# Patient Record
Sex: Female | Born: 1985 | Race: Black or African American | Hispanic: No | Marital: Single | Smoking: Never smoker
Health system: Southern US, Community
[De-identification: ages and names within clinical notes are randomized; demographics above are authoritative.]

## PROBLEM LIST (undated history)

## (undated) ENCOUNTER — Inpatient Hospital Stay (HOSPITAL_COMMUNITY): Payer: Self-pay

## (undated) DIAGNOSIS — R51 Headache: Secondary | ICD-10-CM

## (undated) DIAGNOSIS — R87629 Unspecified abnormal cytological findings in specimens from vagina: Secondary | ICD-10-CM

## (undated) DIAGNOSIS — B009 Herpesviral infection, unspecified: Secondary | ICD-10-CM

## (undated) DIAGNOSIS — D563 Thalassemia minor: Secondary | ICD-10-CM

## (undated) DIAGNOSIS — O139 Gestational [pregnancy-induced] hypertension without significant proteinuria, unspecified trimester: Secondary | ICD-10-CM

## (undated) DIAGNOSIS — R519 Headache, unspecified: Secondary | ICD-10-CM

## (undated) DIAGNOSIS — R7611 Nonspecific reaction to tuberculin skin test without active tuberculosis: Secondary | ICD-10-CM

## (undated) DIAGNOSIS — D649 Anemia, unspecified: Secondary | ICD-10-CM

## (undated) DIAGNOSIS — G43909 Migraine, unspecified, not intractable, without status migrainosus: Secondary | ICD-10-CM

## (undated) DIAGNOSIS — J45909 Unspecified asthma, uncomplicated: Secondary | ICD-10-CM

## (undated) HISTORY — DX: Gestational (pregnancy-induced) hypertension without significant proteinuria, unspecified trimester: O13.9

## (undated) HISTORY — DX: Thalassemia minor: D56.3

## (undated) HISTORY — DX: Unspecified abnormal cytological findings in specimens from vagina: R87.629

## (undated) HISTORY — PX: TONSILLECTOMY: SUR1361

## (undated) HISTORY — PX: TONSILECTOMY, ADENOIDECTOMY, BILATERAL MYRINGOTOMY AND TUBES: SHX2538

## (undated) HISTORY — PX: WISDOM TOOTH EXTRACTION: SHX21

## (undated) HISTORY — DX: Migraine, unspecified, not intractable, without status migrainosus: G43.909

## (undated) HISTORY — DX: Nonspecific reaction to tuberculin skin test without active tuberculosis: R76.11

## (undated) HISTORY — DX: Anemia, unspecified: D64.9

---

## 2006-10-06 HISTORY — PX: PITUITARY SURGERY: SHX203

## 2014-04-09 ENCOUNTER — Emergency Department (HOSPITAL_COMMUNITY)
Admission: EM | Admit: 2014-04-09 | Discharge: 2014-04-09 | Disposition: A | Discharge status: Home / Self Care | Payer: 59 | Source: Home / Self Care | Attending: Family Medicine | Admitting: Family Medicine

## 2014-04-09 ENCOUNTER — Encounter (HOSPITAL_COMMUNITY): Payer: Self-pay | Admitting: Emergency Medicine

## 2014-04-09 ENCOUNTER — Emergency Department (INDEPENDENT_AMBULATORY_CARE_PROVIDER_SITE_OTHER): Payer: 59

## 2014-04-09 ENCOUNTER — Emergency Department (HOSPITAL_COMMUNITY): Payer: 59

## 2014-04-09 DIAGNOSIS — W230XXA Caught, crushed, jammed, or pinched between moving objects, initial encounter: Secondary | ICD-10-CM

## 2014-04-09 DIAGNOSIS — S6000XA Contusion of unspecified finger without damage to nail, initial encounter: Secondary | ICD-10-CM

## 2014-04-09 DIAGNOSIS — S6010XA Contusion of unspecified finger with damage to nail, initial encounter: Secondary | ICD-10-CM

## 2014-04-09 LAB — POCT PREGNANCY, URINE: Preg Test, Ur: NEGATIVE

## 2014-04-09 NOTE — Discharge Instructions (Signed)
Warm soak twice a day, wear splint for protection , advil for soreness.
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
## 2014-07-27 ENCOUNTER — Encounter: Payer: Self-pay | Admitting: Certified Nurse Midwife

## 2014-10-06 NOTE — L&D Delivery Note (Addendum)
Delivery Note  2258: Nurse call reports patient C/C/0 but with urge to push.  In room to assess.  Infant at +3 station.  Nurse instructed to call neonatology team for assistance secondary to Cat II FT and heavy MSAF.  Dr. Marney Doctor and team in room to assess infant after delivery.  Mother delivered as below with staff and family support.   At 11:14 PM, on Sep 03, 2015 a viable female "[name]" was delivered via Vaginal, Spontaneous Delivery (Presentation: Right Occiput Anterior). Bulb suction, of mouth and nares, completed on perineum.  Shoulders delivered easily and infant with good tone and spontaneous cry. Tactile stimulation given by provider and infant placed on mother's abdomen where nurse continued tactile stimulation. Short cord noted and placenta at the introitus.  Cord clamped and cut, but blood not collected. Infant then taken to warmer for assessment by neonatologist.  Infant APGAR: 8, 9. However, infant to be taken to NICU for observation secondary to initial fetal weight of 3lbs 15 oz.  Placenta delivered spontaneously and noted to be intact with short 3VC upon inspection.  Placenta to pathology for SGA. Vaginal inspection revealed no lacerations.  Fundus firm, at the umbilicus, and bleeding scant.  Mother hemodynamically stable and infant skin to skin prior to provider exit.  Mother desires condoms for birth control method and opts to breastfeed.  Anesthesia: Epidural  Episiotomy: None Lacerations: None Suture Repair: None Est. Blood Loss (mL): 50  Mom to Women's Unit.  Baby to NICU.  Maryann Conners MSN, CNM 09/03/2015, 11:46 PM

## 2014-11-13 ENCOUNTER — Emergency Department (HOSPITAL_COMMUNITY)
Admission: EM | Admit: 2014-11-13 | Discharge: 2014-11-13 | Disposition: A | Discharge status: Home / Self Care | Payer: 59 | Attending: Emergency Medicine | Admitting: Emergency Medicine

## 2014-11-13 ENCOUNTER — Encounter (HOSPITAL_COMMUNITY): Payer: Self-pay | Admitting: *Deleted

## 2014-11-13 DIAGNOSIS — R000 Tachycardia, unspecified: Secondary | ICD-10-CM | POA: Diagnosis not present

## 2014-11-13 DIAGNOSIS — R112 Nausea with vomiting, unspecified: Secondary | ICD-10-CM | POA: Diagnosis not present

## 2014-11-13 DIAGNOSIS — R1110 Vomiting, unspecified: Secondary | ICD-10-CM | POA: Diagnosis present

## 2014-11-13 DIAGNOSIS — J45909 Unspecified asthma, uncomplicated: Secondary | ICD-10-CM | POA: Insufficient documentation

## 2014-11-13 DIAGNOSIS — Z79899 Other long term (current) drug therapy: Secondary | ICD-10-CM | POA: Insufficient documentation

## 2014-11-13 DIAGNOSIS — R109 Unspecified abdominal pain: Secondary | ICD-10-CM | POA: Diagnosis not present

## 2014-11-13 DIAGNOSIS — Z3202 Encounter for pregnancy test, result negative: Secondary | ICD-10-CM | POA: Insufficient documentation

## 2014-11-13 HISTORY — DX: Unspecified asthma, uncomplicated: J45.909

## 2014-11-13 LAB — URINE MICROSCOPIC-ADD ON

## 2014-11-13 LAB — CBC WITH DIFFERENTIAL/PLATELET
BASOS ABS: 0 10*3/uL (ref 0.0–0.1)
Basophils Relative: 0 % (ref 0–1)
EOS ABS: 0.1 10*3/uL (ref 0.0–0.7)
Eosinophils Relative: 1 % (ref 0–5)
HEMATOCRIT: 36.6 % (ref 36.0–46.0)
HEMOGLOBIN: 11.6 g/dL — AB (ref 12.0–15.0)
LYMPHS ABS: 0.3 10*3/uL — AB (ref 0.7–4.0)
LYMPHS PCT: 3 % — AB (ref 12–46)
MCH: 24.2 pg — AB (ref 26.0–34.0)
MCHC: 31.7 g/dL (ref 30.0–36.0)
MCV: 76.4 fL — ABNORMAL LOW (ref 78.0–100.0)
Monocytes Absolute: 0.6 10*3/uL (ref 0.1–1.0)
Monocytes Relative: 5 % (ref 3–12)
Neutro Abs: 10.8 10*3/uL — ABNORMAL HIGH (ref 1.7–7.7)
Neutrophils Relative %: 91 % — ABNORMAL HIGH (ref 43–77)
Platelets: 305 10*3/uL (ref 150–400)
RBC: 4.79 MIL/uL (ref 3.87–5.11)
RDW: 14 % (ref 11.5–15.5)
WBC: 11.8 10*3/uL — AB (ref 4.0–10.5)

## 2014-11-13 LAB — URINALYSIS, ROUTINE W REFLEX MICROSCOPIC
Bilirubin Urine: NEGATIVE
Glucose, UA: NEGATIVE mg/dL
KETONES UR: NEGATIVE mg/dL
Leukocytes, UA: NEGATIVE
NITRITE: NEGATIVE
PH: 5.5 (ref 5.0–8.0)
PROTEIN: NEGATIVE mg/dL
Specific Gravity, Urine: 1.025 (ref 1.005–1.030)
Urobilinogen, UA: 1 mg/dL (ref 0.0–1.0)

## 2014-11-13 LAB — COMPREHENSIVE METABOLIC PANEL
ALT: 13 U/L (ref 0–35)
AST: 24 U/L (ref 0–37)
Albumin: 4.4 g/dL (ref 3.5–5.2)
Alkaline Phosphatase: 69 U/L (ref 39–117)
Anion gap: 9 (ref 5–15)
BUN: 21 mg/dL (ref 6–23)
CHLORIDE: 104 mmol/L (ref 96–112)
CO2: 26 mmol/L (ref 19–32)
Calcium: 9 mg/dL (ref 8.4–10.5)
Creatinine, Ser: 0.64 mg/dL (ref 0.50–1.10)
Glucose, Bld: 123 mg/dL — ABNORMAL HIGH (ref 70–99)
Potassium: 3.8 mmol/L (ref 3.5–5.1)
Sodium: 139 mmol/L (ref 135–145)
TOTAL PROTEIN: 8 g/dL (ref 6.0–8.3)
Total Bilirubin: 0.8 mg/dL (ref 0.3–1.2)

## 2014-11-13 LAB — LIPASE, BLOOD: LIPASE: 29 U/L (ref 11–59)

## 2014-11-13 LAB — PREGNANCY, URINE: PREG TEST UR: NEGATIVE

## 2014-11-13 MED ORDER — DIPHENHYDRAMINE HCL 50 MG/ML IJ SOLN
25.0000 mg | Freq: Once | INTRAMUSCULAR | Status: AC
  Administered 2014-11-13: 25 mg via INTRAVENOUS
  Filled 2014-11-13: qty 1

## 2014-11-13 MED ORDER — ONDANSETRON 4 MG PO TBDP: 4.0000 mg | ORAL_TABLET | Freq: Three times a day (TID) | ORAL | Status: DC | PRN

## 2014-11-13 MED ORDER — ONDANSETRON HCL 4 MG/2ML IJ SOLN
4.0000 mg | Freq: Once | INTRAMUSCULAR | Status: AC
  Administered 2014-11-13: 4 mg via INTRAVENOUS
  Filled 2014-11-13: qty 2

## 2014-11-13 MED ORDER — LORAZEPAM 2 MG/ML IJ SOLN: 1.0000 mg | Freq: Once | INTRAMUSCULAR | Status: DC

## 2014-11-13 MED ORDER — SODIUM CHLORIDE 0.9 % IV BOLUS (SEPSIS)
1000.0000 mL | Freq: Once | INTRAVENOUS | Status: AC
  Administered 2014-11-13: 1000 mL via INTRAVENOUS

## 2014-11-13 MED ORDER — MORPHINE SULFATE 4 MG/ML IJ SOLN
4.0000 mg | Freq: Once | INTRAMUSCULAR | Status: DC
  Filled 2014-11-13: qty 1

## 2014-11-13 MED ORDER — PROMETHAZINE HCL 25 MG/ML IJ SOLN
25.0000 mg | Freq: Once | INTRAMUSCULAR | Status: AC
  Administered 2014-11-13: 25 mg via INTRAVENOUS
  Filled 2014-11-13: qty 1

## 2014-11-13 MED ORDER — METOCLOPRAMIDE HCL 5 MG/ML IJ SOLN
10.0000 mg | Freq: Once | INTRAMUSCULAR | Status: AC
  Administered 2014-11-13: 10 mg via INTRAVENOUS
  Filled 2014-11-13: qty 2

## 2014-11-13 NOTE — ED Provider Notes (Signed)
CSN: [id number]     Arrival date & time 11/13/14  0125 History   First MD Initiated Contact with Patient 11/13/14 0235     Chief Complaint  Patient presents with  . Emesis     (Consider location/radiation/quality/duration/timing/severity/associated sxs/prior Treatment) HPI Comments: Patient is a 29 yo F PMHx significant for asthma presenting to the ED for acute onset nausea, nonbloody nonbilious vomiting that began yesterday at 6 AM. She has associated post emesis upper burning cramping abdominal pain. She denies any associated fevers, diarrhea, urinary symptoms, vaginal discharge. She's had decreased by mouth intake. No modifying factors identified. Denies any sick contacts. No history of previous abdominal surgeries. No pregnancies in the past. Patient currently on menstrual cycle.   Patient is a 29 y.o. female presenting with vomiting.  Emesis Associated symptoms: abdominal pain     Past Medical History  Diagnosis Date  . Asthma    Past Surgical History  Procedure Laterality Date  . Pituitary surgery     History reviewed. No pertinent family history. History  Substance Use Topics  . Smoking status: Never Smoker   . Smokeless tobacco: Not on file  . Alcohol Use: No   OB History    No data available     Review of Systems  Gastrointestinal: Positive for nausea, vomiting and abdominal pain.  All other systems reviewed and are negative.     Allergies  Shellfish allergy  Home Medications   Prior to Admission medications   Medication Sig Start Date End Date Taking? Authorizing Provider  albuterol (PROVENTIL HFA;VENTOLIN HFA) 108 (90 BASE) MCG/ACT inhaler Inhale 1 puff into the lungs every 6 (six) hours as needed for wheezing or shortness of breath.   Yes Historical Provider, MD  aspirin-acetaminophen-caffeine (EXCEDRIN MIGRAINE) [phone] MG per tablet Take 1 tablet by mouth every 6 (six) hours as needed for headache.   Yes Historical Provider, MD  ondansetron (ZOFRAN  ODT) 4 MG disintegrating tablet Take 1 tablet (4 mg total) by mouth every 8 (eight) hours as needed for nausea. 11/13/14   Abbygael Curtiss L Kalley Nicholl, PA-C   BP 115/70 mmHg  Pulse 120  Temp(Src) 97.9 F (36.6 C) (Oral)  Resp 18  Ht 5\' 5"  (1.651 m)  Wt 110 lb (49.896 kg)  BMI 18.31 kg/m2  SpO2 100%  LMP 11/08/2014 (Exact Date) Physical Exam  Constitutional: She is oriented to person, place, and time. She appears well-developed and well-nourished. No distress.  HENT:  Head: Normocephalic and atraumatic.  Right Ear: External ear normal.  Left Ear: External ear normal.  Nose: Nose normal.  Mouth/Throat: Uvula is midline and oropharynx is clear and moist. Mucous membranes are dry.  Eyes: Conjunctivae are normal.  Neck: Normal range of motion. Neck supple.  Cardiovascular: Regular rhythm and normal heart sounds.  Tachycardia present.   Pulmonary/Chest: Effort normal and breath sounds normal. No respiratory distress.  Abdominal: Soft. Bowel sounds are normal. There is no tenderness.  Musculoskeletal: Normal range of motion.  Neurological: She is alert and oriented to person, place, and time.  Skin: Skin is warm and dry. She is not diaphoretic.  Psychiatric: She has a normal mood and affect.  Nursing note and vitals reviewed.   ED Course  Procedures (including critical care time) Medications  morphine 4 MG/ML injection 4 mg (4 mg Intravenous Not Given 11/13/14 0457)  LORazepam (ATIVAN) injection 1 mg (not administered)  ondansetron (ZOFRAN) injection 4 mg (4 mg Intravenous Given 11/13/14 0243)  promethazine (PHENERGAN) injection 25 mg (25 mg  Intravenous Given 11/13/14 0457)  sodium chloride 0.9 % bolus 1,000 mL (1,000 mLs Intravenous New Bag/Given 11/13/14 0553)  metoCLOPramide (REGLAN) injection 10 mg (10 mg Intravenous Given 11/13/14 0554)  diphenhydrAMINE (BENADRYL) injection 25 mg (25 mg Intravenous Given 11/13/14 0554)    Labs Review Labs Reviewed  CBC WITH DIFFERENTIAL/PLATELET - Abnormal;  Notable for the following:    WBC 11.8 (*)    Hemoglobin 11.6 (*)    MCV 76.4 (*)    MCH 24.2 (*)    Neutrophils Relative % 91 (*)    Neutro Abs 10.8 (*)    Lymphocytes Relative 3 (*)    Lymphs Abs 0.3 (*)    All other components within normal limits  COMPREHENSIVE METABOLIC PANEL - Abnormal; Notable for the following:    Glucose, Bld 123 (*)    All other components within normal limits  URINALYSIS, ROUTINE W REFLEX MICROSCOPIC - Abnormal; Notable for the following:    Hgb urine dipstick MODERATE (*)    All other components within normal limits  URINE MICROSCOPIC-ADD ON - Abnormal; Notable for the following:    Squamous Epithelial / LPF FEW (*)    All other components within normal limits  LIPASE, BLOOD  PREGNANCY, URINE    Imaging Review No results found.   EKG Interpretation None      5:40 AM on reevaluation patient with another episode of nonbloody emesis in ED will order more nausea medicine and fluids.  MDM   Final diagnoses:  Non-intractable vomiting with nausea, vomiting of unspecified type    Filed Vitals:   11/13/14 0530  BP: 115/70  Pulse: 120  Temp:   Resp:    Afebrile, NAD, non-toxic appearing, AAOx4.   Mucous membranes dry, slight tachycardia noted. Abdomen is soft, nontender, nondistended. IV fluids and nausea medicine ordered. Patient monitored in ED, nausea returned along with pain. Patient declined any pain medication. Labs reviewed. Patient with another incidence of emesis while in ED, another bolus of IV fluids will be ordered along with more nausea medication. Patient will be signed out to [employer], PA-C pending PO challenge.   Harlow Mares, PA-C 11/13/14 0177  Blanchie Dessert, MD 11/13/14 [phone]

## 2014-11-13 NOTE — Discharge Instructions (Signed)
Please follow up with your primary care physician in 1-2 days. If you do not have one please call the [facility] number listed above. Please read all discharge instructions and return precautions.    Nausea and Vomiting Nausea is a sick feeling that often comes before throwing up (vomiting). Vomiting is a reflex where stomach contents come out of your mouth. Vomiting can cause severe loss of body fluids (dehydration). Children and elderly adults can become dehydrated quickly, especially if they also have diarrhea. Nausea and vomiting are symptoms of a condition or disease. It is important to find the cause of your symptoms. CAUSES   Direct irritation of the stomach lining. This irritation can result from increased acid production (gastroesophageal reflux disease), infection, food poisoning, taking certain medicines (such as nonsteroidal anti-inflammatory drugs), alcohol use, or tobacco use.  Signals from the brain.These signals could be caused by a headache, heat exposure, an inner ear disturbance, increased pressure in the brain from injury, infection, a tumor, or a concussion, pain, emotional stimulus, or metabolic problems.  An obstruction in the gastrointestinal tract (bowel obstruction).  Illnesses such as diabetes, hepatitis, gallbladder problems, appendicitis, kidney problems, cancer, sepsis, atypical symptoms of a heart attack, or eating disorders.  Medical treatments such as chemotherapy and radiation.  Receiving medicine that makes you sleep (general anesthetic) during surgery. DIAGNOSIS Your caregiver may ask for tests to be done if the problems do not improve after a few days. Tests may also be done if symptoms are severe or if the reason for the nausea and vomiting is not clear. Tests may include:  Urine tests.  Blood tests.  Stool tests.  Cultures (to look for evidence of infection).  X-rays or other imaging studies. Test results can help your  caregiver make decisions about treatment or the need for additional tests. TREATMENT You need to stay well hydrated. Drink frequently but in small amounts.You may wish to drink water, sports drinks, clear broth, or eat frozen ice pops or gelatin dessert to help stay hydrated.When you eat, eating slowly may help prevent nausea.There are also some antinausea medicines that may help prevent nausea. HOME CARE INSTRUCTIONS   Take all medicine as directed by your caregiver.  If you do not have an appetite, do not force yourself to eat. However, you must continue to drink fluids.  If you have an appetite, eat a normal diet unless your caregiver tells you differently.  Eat a variety of complex carbohydrates (rice, wheat, potatoes, bread), lean meats, yogurt, fruits, and vegetables.  Avoid high-fat foods because they are more difficult to digest.  Drink enough water and fluids to keep your urine clear or pale yellow.  If you are dehydrated, ask your caregiver for specific rehydration instructions. Signs of dehydration may include:  Severe thirst.  Dry lips and mouth.  Dizziness.  Dark urine.  Decreasing urine frequency and amount.  Confusion.  Rapid breathing or pulse. SEEK IMMEDIATE MEDICAL CARE IF:   You have blood or brown flecks (like coffee grounds) in your vomit.  You have black or bloody stools.  You have a severe headache or stiff neck.  You are confused.  You have severe abdominal pain.  You have chest pain or trouble breathing.  You do not urinate at least once every 8 hours.  You develop cold or clammy skin.  You continue to vomit for longer than 24 to 48 hours.  You have a fever. MAKE SURE YOU:   Understand these instructions.  Will watch your condition.  Will get help right away if you are not doing well or get worse. Document Released: 09/22/2005 Document Revised: 12/15/2011 Document Reviewed: 02/19/2011 [facility] Patient Information 2015  [place], [place]. This information is not intended to replace advice given to you by your health care provider. Make sure you discuss any questions you have with your health care provider.

## 2014-11-13 NOTE — ED Notes (Signed)
Pt reports vomiting that began approx 0600 yesterday morning, denies diarrhea or fever.

## 2014-11-13 NOTE — ED Notes (Signed)
Nurse drawing labs. 

## 2014-11-13 NOTE — ED Notes (Signed)
Pt tolerated oral fluids w/o difficulty.

## 2014-11-13 NOTE — ED Notes (Signed)
Patient made aware urine sample is needed but unable to go, will see if she can go once fluids have gone through some.

## 2015-01-22 LAB — OB RESULTS CONSOLE HEPATITIS B SURFACE ANTIGEN: Hepatitis B Surface Ag: NEGATIVE

## 2015-01-22 LAB — OB RESULTS CONSOLE HIV ANTIBODY (ROUTINE TESTING): HIV: NONREACTIVE

## 2015-01-22 LAB — OB RESULTS CONSOLE ABO/RH: RH TYPE: POSITIVE

## 2015-01-22 LAB — OB RESULTS CONSOLE GC/CHLAMYDIA
CHLAMYDIA, DNA PROBE: NEGATIVE
Gonorrhea: NEGATIVE

## 2015-01-22 LAB — OB RESULTS CONSOLE RUBELLA ANTIBODY, IGM: Rubella: IMMUNE

## 2015-01-22 LAB — OB RESULTS CONSOLE ANTIBODY SCREEN: Antibody Screen: NEGATIVE

## 2015-01-22 LAB — OB RESULTS CONSOLE RPR: RPR: NONREACTIVE

## 2015-04-24 ENCOUNTER — Encounter (HOSPITAL_COMMUNITY): Payer: Self-pay | Admitting: Emergency Medicine

## 2015-06-07 ENCOUNTER — Inpatient Hospital Stay (HOSPITAL_COMMUNITY): Admission: AD | Admit: 2015-06-07 | Payer: 59 | Source: Ambulatory Visit | Admitting: Obstetrics & Gynecology

## 2015-08-28 LAB — OB RESULTS CONSOLE GBS: STREP GROUP B AG: POSITIVE

## 2015-09-03 ENCOUNTER — Encounter (HOSPITAL_COMMUNITY): Payer: Self-pay | Admitting: *Deleted

## 2015-09-03 ENCOUNTER — Inpatient Hospital Stay (HOSPITAL_COMMUNITY): Payer: 59 | Admitting: Anesthesiology

## 2015-09-03 ENCOUNTER — Inpatient Hospital Stay (HOSPITAL_COMMUNITY)
Admission: RE | Admit: 2015-09-03 | Discharge: 2015-09-05 | DRG: 775 | Disposition: A | Discharge status: Home / Self Care | Payer: 59 | Source: Ambulatory Visit | Attending: Obstetrics and Gynecology | Admitting: Obstetrics and Gynecology

## 2015-09-03 DIAGNOSIS — Z3A37 37 weeks gestation of pregnancy: Secondary | ICD-10-CM

## 2015-09-03 DIAGNOSIS — Z91013 Allergy to seafood: Secondary | ICD-10-CM | POA: Diagnosis not present

## 2015-09-03 DIAGNOSIS — Z8669 Personal history of other diseases of the nervous system and sense organs: Secondary | ICD-10-CM

## 2015-09-03 DIAGNOSIS — O99824 Streptococcus B carrier state complicating childbirth: Secondary | ICD-10-CM | POA: Diagnosis present

## 2015-09-03 DIAGNOSIS — O26893 Other specified pregnancy related conditions, third trimester: Secondary | ICD-10-CM | POA: Diagnosis present

## 2015-09-03 DIAGNOSIS — G8929 Other chronic pain: Secondary | ICD-10-CM | POA: Diagnosis present

## 2015-09-03 DIAGNOSIS — R519 Headache, unspecified: Secondary | ICD-10-CM | POA: Diagnosis present

## 2015-09-03 DIAGNOSIS — J45909 Unspecified asthma, uncomplicated: Secondary | ICD-10-CM | POA: Diagnosis present

## 2015-09-03 DIAGNOSIS — O9952 Diseases of the respiratory system complicating childbirth: Secondary | ICD-10-CM | POA: Diagnosis present

## 2015-09-03 DIAGNOSIS — O365930 Maternal care for other known or suspected poor fetal growth, third trimester, not applicable or unspecified: Secondary | ICD-10-CM | POA: Diagnosis present

## 2015-09-03 DIAGNOSIS — G43909 Migraine, unspecified, not intractable, without status migrainosus: Secondary | ICD-10-CM

## 2015-09-03 DIAGNOSIS — R51 Headache: Secondary | ICD-10-CM

## 2015-09-03 HISTORY — DX: Headache: R51

## 2015-09-03 HISTORY — DX: Headache, unspecified: R51.9

## 2015-09-03 LAB — CBC
HEMATOCRIT: 40 % (ref 36.0–46.0)
HEMOGLOBIN: 13.2 g/dL (ref 12.0–15.0)
MCH: 24.3 pg — AB (ref 26.0–34.0)
MCHC: 33 g/dL (ref 30.0–36.0)
MCV: 73.5 fL — AB (ref 78.0–100.0)
PLATELETS: 226 10*3/uL (ref 150–400)
RBC: 5.44 MIL/uL — AB (ref 3.87–5.11)
RDW: 20.2 % — AB (ref 11.5–15.5)
WBC: 7.3 10*3/uL (ref 4.0–10.5)

## 2015-09-03 LAB — TYPE AND SCREEN
ABO/RH(D): B POS
Antibody Screen: NEGATIVE

## 2015-09-03 MED ORDER — OXYTOCIN 40 UNITS IN LACTATED RINGERS INFUSION - SIMPLE MED
62.5000 mL/h | INTRAVENOUS | Status: DC
  Administered 2015-09-03: 62.5 mL/h via INTRAVENOUS
  Filled 2015-09-03: qty 1000

## 2015-09-03 MED ORDER — ACETAMINOPHEN 325 MG PO TABS: 650.0000 mg | ORAL_TABLET | ORAL | Status: DC | PRN

## 2015-09-03 MED ORDER — OXYCODONE-ACETAMINOPHEN 5-325 MG PO TABS
2.0000 | ORAL_TABLET | ORAL | Status: DC | PRN
Start: 2015-09-03 — End: 2015-09-04

## 2015-09-03 MED ORDER — LIDOCAINE HCL (PF) 1 % IJ SOLN
30.0000 mL | INTRAMUSCULAR | Status: DC | PRN
  Filled 2015-09-03: qty 30

## 2015-09-03 MED ORDER — FENTANYL CITRATE (PF) 100 MCG/2ML IJ SOLN
50.0000 ug | INTRAMUSCULAR | Status: DC | PRN
  Administered 2015-09-03: 50 ug via INTRAVENOUS
  Administered 2015-09-03: 100 ug via INTRAVENOUS
  Filled 2015-09-03 (×3): qty 2

## 2015-09-03 MED ORDER — LACTATED RINGERS IV SOLN
INTRAVENOUS | Status: DC
  Administered 2015-09-03: 20:00:00 via INTRAVENOUS

## 2015-09-03 MED ORDER — PHENYLEPHRINE 40 MCG/ML (10ML) SYRINGE FOR IV PUSH (FOR BLOOD PRESSURE SUPPORT)
PREFILLED_SYRINGE | INTRAVENOUS | Status: AC
  Filled 2015-09-03: qty 20

## 2015-09-03 MED ORDER — ONDANSETRON HCL 4 MG/2ML IJ SOLN
4.0000 mg | Freq: Four times a day (QID) | INTRAMUSCULAR | Status: DC | PRN
  Administered 2015-09-03: 4 mg via INTRAVENOUS
  Filled 2015-09-03: qty 2

## 2015-09-03 MED ORDER — PENICILLIN G POTASSIUM 5000000 UNITS IJ SOLR
5.0000 10*6.[IU] | Freq: Once | INTRAVENOUS | Status: AC
  Administered 2015-09-03: 5 10*6.[IU] via INTRAVENOUS
  Filled 2015-09-03: qty 5

## 2015-09-03 MED ORDER — LIDOCAINE HCL (PF) 1 % IJ SOLN
INTRAMUSCULAR | Status: DC | PRN
  Administered 2015-09-03 (×2): 4 mL via EPIDURAL

## 2015-09-03 MED ORDER — DIPHENHYDRAMINE HCL 50 MG/ML IJ SOLN: 12.5000 mg | INTRAMUSCULAR | Status: DC | PRN

## 2015-09-03 MED ORDER — EPHEDRINE 5 MG/ML INJ
10.0000 mg | INTRAVENOUS | Status: DC | PRN
  Filled 2015-09-03: qty 2

## 2015-09-03 MED ORDER — DEXTROSE 5 % IV SOLN
2.5000 10*6.[IU] | INTRAVENOUS | Status: DC
  Administered 2015-09-03: 2.5 10*6.[IU] via INTRAVENOUS
  Filled 2015-09-03 (×5): qty 2.5

## 2015-09-03 MED ORDER — CITRIC ACID-SODIUM CITRATE 334-500 MG/5ML PO SOLN: 30.0000 mL | ORAL | Status: DC | PRN

## 2015-09-03 MED ORDER — PROMETHAZINE HCL 25 MG/ML IJ SOLN
12.5000 mg | Freq: Once | INTRAMUSCULAR | Status: AC
  Administered 2015-09-03: 12.5 mg via INTRAVENOUS
  Filled 2015-09-03: qty 1

## 2015-09-03 MED ORDER — PHENYLEPHRINE 40 MCG/ML (10ML) SYRINGE FOR IV PUSH (FOR BLOOD PRESSURE SUPPORT)
80.0000 ug | PREFILLED_SYRINGE | INTRAVENOUS | Status: DC | PRN
  Filled 2015-09-03: qty 2

## 2015-09-03 MED ORDER — FLEET ENEMA 7-19 GM/118ML RE ENEM: 1.0000 | ENEMA | RECTAL | Status: DC | PRN

## 2015-09-03 MED ORDER — FENTANYL 2.5 MCG/ML BUPIVACAINE 1/10 % EPIDURAL INFUSION (WH - ANES)
INTRAMUSCULAR | Status: AC
  Administered 2015-09-03: 14 mL/h via EPIDURAL
  Filled 2015-09-03: qty 125

## 2015-09-03 MED ORDER — LACTATED RINGERS IV SOLN: 500.0000 mL | INTRAVENOUS | Status: DC | PRN

## 2015-09-03 MED ORDER — OXYTOCIN BOLUS FROM INFUSION: 500.0000 mL | INTRAVENOUS | Status: DC

## 2015-09-03 MED ORDER — FENTANYL 2.5 MCG/ML BUPIVACAINE 1/10 % EPIDURAL INFUSION (WH - ANES)
14.0000 mL/h | INTRAMUSCULAR | Status: DC | PRN
  Administered 2015-09-03 (×2): 14 mL/h via EPIDURAL

## 2015-09-03 MED ORDER — OXYCODONE-ACETAMINOPHEN 5-325 MG PO TABS
1.0000 | ORAL_TABLET | ORAL | Status: DC | PRN
Start: 2015-09-03 — End: 2015-09-04

## 2015-09-03 NOTE — Progress Notes (Addendum)
Brookelynne Autin MRN: [id number]  Subjective: -Patient s/p epidural.  Reports continued perception of contractions in lower abdominal area, above pubic bone.  FOB DeAndre at bedside, supportive.   Objective: BP 129/90 mmHg  Pulse 93  Temp(Src) 97.5 F (36.4 C) (Oral)  Resp 18  Ht 5\' 5"  (1.651 m)  Wt 61.236 kg (135 lb)  BMI 22.47 kg/m2  LMP 12/15/2014      Fetal Monitoring: FHT: 135 bpm, Mod Var, +Prolonged Decels, +Accels UC: palpates moderate to strong    Vaginal Exam: SVE:   Dilation: 4 Effacement (%): 70 Station: -1 Exam by:: Janett Billow, CNM  Drk Brown Meconium Noted Membranes:SROM Internal Monitors: Utilized x 2  Augmentation/Induction: Pitocin:None Cytotec: None  O2 applied via rebreather Fluid bolus initiated  Assessment:  IUP at 37.3wks Cat II FT  Active Labor GBS Positive MSAF Nausea  Plan: -Cat II FT resolved with initiation of O2, fluid bolus -FSE applied -Patient given bolus dose, via, pca for pain -Will contact anesthesia if pain unrelieved after pca dosing -Phenergan once ordered for nausea -Continue other mgmt as ordered   Riley Churches, CNM 09/03/2015, 9:51 PM   Addendum Nurse call reports FSE off and reapplied without difficulty Reports patient is 5-6cm and starting to get comfort with epidural Continue to monitor  Maryann Conners MSN, CNM 09/03/2015 9:58 PM

## 2015-09-03 NOTE — Anesthesia Procedure Notes (Signed)
Epidural Patient location during procedure: OB Start time: 09/03/2015 8:47 PM  Staffing Anesthesiologist: Josephine Igo Performed by: anesthesiologist   Preanesthetic Checklist Completed: patient identified, site marked, surgical consent, pre-op evaluation, timeout performed, IV checked, risks and benefits discussed and monitors and equipment checked  Epidural Patient position: sitting Prep: site prepped and draped and DuraPrep Patient monitoring: continuous pulse ox and blood pressure Approach: midline Location: L3-L4 Injection technique: LOR air  Needle:  Needle type: Tuohy  Needle gauge: 17 G Needle length: 9 cm and 9 Needle insertion depth: 5 cm cm Catheter type: closed end flexible Catheter size: 19 Gauge Catheter at skin depth: 10 cm Test dose: negative and Other  Assessment Events: blood not aspirated, injection not painful, no injection resistance, negative IV test and no paresthesia  Additional Notes Patient identified. Risks and benefits discussed including failed block, incomplete  Pain control, post dural puncture headache, nerve damage, paralysis, blood pressure Changes, nausea, vomiting, reactions to medications-both toxic and allergic and post Partum back pain. All questions were answered. Patient expressed understanding and wished to proceed. Sterile technique was used throughout procedure. Epidural site was Dressed with sterile barrier dressing. No paresthesias, signs of intravascular injection Or signs of intrathecal spread were encountered.  Patient was more comfortable after the epidural was dosed. Please see RN's note for documentation of vital signs and FHR which are stable.

## 2015-09-03 NOTE — Progress Notes (Signed)
Jasmine Flores MRN: [id number]  Subjective: -Care assumed of 29y.o. G1P0 at 37.3wks who presents for SROM at 1600.  In room to meet acquaintance and discuss POC.  Patient states she prefers to go by name of "Jasmine Flores."  Patient expresses discomfort with contractions and requests epidural.  Patient states she would like to wait, for placement, until boyfriend arrives.   Objective: BP 149/95 mmHg  Pulse 79  Temp(Src) 97.5 F (36.4 C) (Oral)  Resp 18  Ht 5\' 5"  (1.651 m)  Wt 61.236 kg (135 lb)  BMI 22.47 kg/m2  LMP 12/15/2014      Fetal Monitoring: FHT: 140 bpm, Mod Var, -Decels, +Accels UC: Palpates mild    Vaginal Exam: SVE:    Deferred Membranes:SROM  Internal Monitors: None  Augmentation/Induction: Pitocin:None Cytotec: None  Assessment:  IUP at 37.3wks Cat I FT  SROM GBS Positive  Plan: -Okay for epidural  -Discussed IUPC insertion and augmentation methods after comfort with epidural established -Discussed r/b of IUPC including infection, ability to measure contraction quality and quantity. -Discussed r/b of pitocin and/or cytotec usage including fetal distress/intolerance resulting in increased risk for c/s -Patient verbalizes understanding and agrees with POC -Dr. Kathlen Mody aware of patient status -Continue other mgmt as ordered  Riley Churches, CNM 09/03/2015, 8:06 PM

## 2015-09-03 NOTE — Anesthesia Preprocedure Evaluation (Signed)
Anesthesia Evaluation  Patient identified by MRN, date of birth, ID band Patient awake    Reviewed: Allergy & Precautions, NPO status , Patient's Chart, lab work & pertinent test results  Airway Mallampati: II  TM Distance: >3 FB Neck ROM: Full    Dental no notable dental hx. (+) Teeth Intact   Pulmonary asthma ,    Pulmonary exam normal breath sounds clear to auscultation       Cardiovascular negative cardio ROS Normal cardiovascular exam Rhythm:Regular Rate:Normal     Neuro/Psych  Headaches, negative psych ROS   GI/Hepatic negative GI ROS, Neg liver ROS,   Endo/Other  Hx/o Pituitary surgery  Renal/GU negative Renal ROS  negative genitourinary   Musculoskeletal negative musculoskeletal ROS (+)   Abdominal   Peds  Hematology negative hematology ROS (+)   Anesthesia Other Findings   Reproductive/Obstetrics (+) Pregnancy                             Anesthesia Physical Anesthesia Plan  ASA: II  Anesthesia Plan: Epidural   Post-op Pain Management:    Induction:   Airway Management Planned: Natural Airway  Additional Equipment:   Intra-op Plan:   Post-operative Plan:   Informed Consent: I have reviewed the patients History and Physical, chart, labs and discussed the procedure including the risks, benefits and alternatives for the proposed anesthesia with the patient or authorized representative who has indicated his/her understanding and acceptance.     Plan Discussed with: Anesthesiologist  Anesthesia Plan Comments:         Anesthesia Quick Evaluation

## 2015-09-03 NOTE — H&P (Signed)
Jasmine Flores is a 29 y.o. female, G1P000 at 37.3 weeks, presenting a direct admit to [facility].  PT presented as a labor check today at [place]. After she scheduled her office appointment she experienced a large gush of fluid asnd started leaking brownish fluid. C/o back and pelvic pain. In the office, noted to be grossly ruptuure with thich mecomiun & particulate matter.  GBS positive.  SVE: 1/thick0high.  Patient Active Problem List   Diagnosis Date Noted  . Labor and delivery complicated by meconium in amniotic fluid 09/03/2015  . Asthma 09/03/2015  . Hx of migraines 09/03/2015  . Allergy to shellfish 09/03/2015  . Chronic headache disorder 09/03/2015    History of present pregnancy: Patient entered care at 9.4 weeks.   EDC of12/16/16 was established by early [place] at 8.4 wks on 02/13/15.    Anatomy scan:  19.3 weeks, with normal findings and an Anterior placenta. Vertex, singleton, no previa, placenta  ede to cx= 7.9cm.  Placental cord insertion seen.  Normal fluid. AP pocket=4.0cm. Measurement concordant  with establish GA.    Anatomy: open hands, 5th digit, ng, AA and DA seen.   Female gender.  Additional [place] evaluations:      [redacted]w[redacted]d ( Dating 02/13/15):   Anteverted uterus, [place] [phone] intrauterine pregnancy. Yolk sac seen  [redacted]w[redacted]d ([place] 02/20/15): Anteverted uterus, [place] [phone] intrauterine pregnancy.   [redacted]w[redacted]d (NT screening 03/09/15):  [place] IUP +FHT.  Anteverted uterus, amnion seen, normal fluid, CRL is  concordant with  Ultrasound estabilished GA/EDD.  NRT=1.28mm. RTOV / Right adnexa- unremarkable. Left  LTOV- simple cyst. Color flow seen.  Measures 3.6cm X 3.2cm X 3.0 cm. Left adenexa unremarkable.  No  free fluid.  Signficant prenatal events: First Trimester :  C/o painful feet, fatigue, nausea, migraines.   Has asthama- uses an albuterol inhaler. Experienced first trimester spotting.  Cervicovaginal cytology: Low grade squamous intraepithelial lesion noted on pap- Colpo  done 5/24, postpartum follow up recommended. Second Trimester: Continues to c/o headaches, Fioricet provided. Third Trimester: Low Hgb noted, daily iron supplement provided.   Last evaluation:  09/03/15   V.Standard, CNM      BP 126/78,    Pt. present for labor check/D/C. Pt. states she thought she lost her mucous plug; pt. states after getting off the phone w/[facility] she heard a gush noise since then she has been leaking brownish fluid. Bk/pelvic pain present. No recent MAU visits. Pt. appears to be in lots of pain. LJ CMA   OB History    Gravida Para Term Preterm AB TAB SAB Ectopic Multiple Living   0 0 0 0 0 0 0 0       Past Medical History  Diagnosis Date  . Asthma    Past Surgical History  Procedure Laterality Date  . Pituitary surgery     Family History: family history is not on file. Social History:  reports that she has never smoked. She does not have any smokeless tobacco history on file. She reports that she does not drink alcohol or use illicit drugs.   Prenatal Transfer Tool  Maternal Diabetes: No Genetic Screening: Normal Maternal Ultrasounds/Referrals: Normal Fetal Ultrasounds or other Referrals:  None Maternal Substance Abuse:  No Significant Maternal Medications:  Meds include: Other:   Fioricet,  Iron,  ProAIr inhaler Significant Maternal Lab Results: Lab values include: Group B Strep positive  TDAP 07/18/15 Flu    Unknown  ROS:  See above  Allergies  Allergen Reactions  . Shellfish Allergy  Swelling       There were no vitals taken for this visit.  Chest clear Heart RRR without murmur Abd gravid, NT,  Pelvic:  1/ thick/high in office, today 09/03/15 Ext: wnl   FHR: Category 1, 135 bpm, moderate variability,  No accels. No decels UCs:  Was irregular, q 4-7 ; NOW Regular, Ctx 2-4 min Vertex by [place]  Prenatal labs: ABO, Rh:       B, postive Antibody:    negative Rubella:  !Error!   Immune RPR:    NR , 01/22/15 HBsAg:   Negative   01/22/15 HIV:     NR GBS:    Positive   08/28/15  Sickle cell/Hgb electrophoresis:  AA% Pap:  Abn - Cervicovaginal cytology: Low grade squamous intraepithelial lesion noted on pap- Colpo done 5/24,  GC:  Negative 01/22/15 Chlamydia:  Negative   01/22/15, Genetic screenings:  Normal,  1rst trimester Glucola:  WNL Other:   Hgb 10.6 at NOB, 9.8 at 28 weeks   Assessment/Plan: IUP at 37.3 Vertex by [place] Rupture of Membranes at 1608 today, Thick meconium  GBS positive Allergy to shellfish Asthama Hx Migraines HX Chronic headache disorder   Plan: Admit to [facility] per consult with Dr. Charlesetta Garibaldi Routine [facility] orders Pain med/epidural prn PCN G for GBS prophylaxis  Expectant Management   Cherre Huger, [place] 09/03/2015, 5:37 PM

## 2015-09-03 NOTE — Progress Notes (Signed)
Jasmine Flores MRN: [id number]  Subjective: -Nurse call reports prolonged deceleration.  In room to assess.  Patient on hands and knees with O2 in place.    Objective: BP 134/72 mmHg  Pulse 94  Temp(Src) 97.5 F (36.4 C) (Oral)  Resp 18  Ht 5\' 5"  (1.651 m)  Wt 61.236 kg (135 lb)  BMI 22.47 kg/m2  SpO2 100%  LMP 12/15/2014      Fetal Monitoring: FHT: 145 bpm, Mod Var, Prolonged/Late Decels, +Accels UC: Q2-81min, palpates strong, MVUs    Vaginal Exam: SVE:   Dilation: 8 Effacement (%): 90 Station: -1 Exam by:: Kura Bethards, CNM Copious amt of [place] Membranes:SROM x 6 hrs Internal Monitors: In place  Augmentation/Induction: Pitocin:None Cytotec: None  Assessment:  IUP at 37.3wks Cat II FT  Active Labor Transition Phase  Plan: -Position change results in resolution of Cat II FT -Continue other mgmt as ordered -Anticipate SVD -Dr. Kathlen Mody updated on patient status   Riley Churches, CNM 09/03/2015, 22:42 PM

## 2015-09-04 ENCOUNTER — Encounter (HOSPITAL_COMMUNITY): Payer: Self-pay | Admitting: *Deleted

## 2015-09-04 LAB — CBC
HEMATOCRIT: 33.5 % — AB (ref 36.0–46.0)
HEMOGLOBIN: 11 g/dL — AB (ref 12.0–15.0)
MCH: 24 pg — AB (ref 26.0–34.0)
MCHC: 32.8 g/dL (ref 30.0–36.0)
MCV: 73.1 fL — AB (ref 78.0–100.0)
Platelets: 190 10*3/uL (ref 150–400)
RBC: 4.58 MIL/uL (ref 3.87–5.11)
RDW: 20 % — ABNORMAL HIGH (ref 11.5–15.5)
WBC: 9.8 10*3/uL (ref 4.0–10.5)

## 2015-09-04 LAB — PROTEIN / CREATININE RATIO, URINE
CREATININE, URINE: 99 mg/dL
PROTEIN CREATININE RATIO: 0.18 mg/mg{creat} — AB (ref 0.00–0.15)
TOTAL PROTEIN, URINE: 18 mg/dL

## 2015-09-04 LAB — RPR: RPR: NONREACTIVE

## 2015-09-04 LAB — ABO/RH: ABO/RH(D): B POS

## 2015-09-04 MED ORDER — ACETAMINOPHEN 325 MG PO TABS: 650.0000 mg | ORAL_TABLET | ORAL | Status: DC | PRN

## 2015-09-04 MED ORDER — OXYCODONE-ACETAMINOPHEN 5-325 MG PO TABS: 2.0000 | ORAL_TABLET | ORAL | Status: DC | PRN

## 2015-09-04 MED ORDER — SIMETHICONE 80 MG PO CHEW: 80.0000 mg | CHEWABLE_TABLET | ORAL | Status: DC | PRN

## 2015-09-04 MED ORDER — BENZOCAINE-MENTHOL 20-0.5 % EX AERO: 1.0000 "application " | INHALATION_SPRAY | CUTANEOUS | Status: DC | PRN

## 2015-09-04 MED ORDER — SENNOSIDES-DOCUSATE SODIUM 8.6-50 MG PO TABS
2.0000 | ORAL_TABLET | ORAL | Status: DC
Start: 2015-09-05 — End: 2015-09-05
  Filled 2015-09-04: qty 2

## 2015-09-04 MED ORDER — DIPHENHYDRAMINE HCL 25 MG PO CAPS: 25.0000 mg | ORAL_CAPSULE | Freq: Four times a day (QID) | ORAL | Status: DC | PRN

## 2015-09-04 MED ORDER — DIBUCAINE 1 % RE OINT: 1.0000 "application " | TOPICAL_OINTMENT | RECTAL | Status: DC | PRN

## 2015-09-04 MED ORDER — LANOLIN HYDROUS EX OINT: TOPICAL_OINTMENT | CUTANEOUS | Status: DC | PRN

## 2015-09-04 MED ORDER — ZOLPIDEM TARTRATE 5 MG PO TABS: 5.0000 mg | ORAL_TABLET | Freq: Every evening | ORAL | Status: DC | PRN

## 2015-09-04 MED ORDER — IBUPROFEN 600 MG PO TABS
600.0000 mg | ORAL_TABLET | Freq: Four times a day (QID) | ORAL | Status: DC
  Administered 2015-09-04 – 2015-09-05 (×7): 600 mg via ORAL
  Filled 2015-09-04 (×7): qty 1

## 2015-09-04 MED ORDER — TETANUS-DIPHTH-ACELL PERTUSSIS 5-2.5-18.5 LF-MCG/0.5 IM SUSP: 0.5000 mL | Freq: Once | INTRAMUSCULAR | Status: DC

## 2015-09-04 MED ORDER — OXYCODONE-ACETAMINOPHEN 5-325 MG PO TABS
1.0000 | ORAL_TABLET | ORAL | Status: DC | PRN
  Administered 2015-09-04: 1 via ORAL
  Filled 2015-09-04: qty 1

## 2015-09-04 MED ORDER — ONDANSETRON HCL 4 MG/2ML IJ SOLN: 4.0000 mg | INTRAMUSCULAR | Status: DC | PRN

## 2015-09-04 MED ORDER — PRENATAL MULTIVITAMIN CH
1.0000 | ORAL_TABLET | Freq: Every day | ORAL | Status: DC
  Administered 2015-09-04 – 2015-09-05 (×2): 1 via ORAL
  Filled 2015-09-04 (×2): qty 1

## 2015-09-04 MED ORDER — WITCH HAZEL-GLYCERIN EX PADS: 1.0000 "application " | MEDICATED_PAD | CUTANEOUS | Status: DC | PRN

## 2015-09-04 MED ORDER — ONDANSETRON HCL 4 MG PO TABS: 4.0000 mg | ORAL_TABLET | ORAL | Status: DC | PRN

## 2015-09-04 NOTE — Lactation Note (Signed)
This note was copied from the chart of Jasmine Flores. Lactation Consultation Note  Patient Name: Jasmine Flores [id number] Date: 09/04/2015 Reason for consult: Initial assessment;NICU baby  NICU baby 12 hours, [redacted]w[redacted]d, SGA. Mom states that she has pumped twice in 12 hours and is not seeing any colostrum now. Enc mom to pump 8 times/24 hours for 15 minutes followed by hand expression. Demonstrated hand expression with no colostrum present. Discussed normal progression of milk coming to volume, and supply and demand. Mom given NICU booklet and [place] brochure with review. Mom aware of OP/BFSG and [place] phone line assistance after D/C. Mom states that she has had baby to both breast. Discussed reasons why baby may "prefer" one breast over the other, and discussed football and cross-cradle positions. Enc mom to continue to offer STS and latching at breast as she and baby able. Mom states that she already has her Med[place] DEBP at home. Mom able to attend first part of BF class at [facility], but baby came prior to second class. Mom has necessary BF supplies at bedside, and is aware of EBM storage guidelines.  Maternal Data Has patient been taught Hand Expression?: Yes Does the patient have breastfeeding experience prior to this delivery?: No  Feeding Feeding Type: Bottle Fed - Formula Nipple Type: Slow - flow Length of feed: 10 min  LATCH Score/Interventions Latch: Grasps breast easily, tongue down, lips flanged, rhythmical sucking.  Audible Swallowing: Spontaneous and intermittent  Type of Nipple: Everted at rest and after stimulation  Comfort (Breast/Nipple): Soft / non-tender     Hold (Positioning): No assistance needed to correctly position infant at breast.  LATCH Score: 10  Lactation Tools Discussed/Used Pump Review: Setup, frequency, and cleaning;Milk Storage Initiated by:: Bedside RN. Date initiated:: 09/03/15   Consult Status Consult Status: Follow-up Date: 09/05/15 Follow-up  type: In-patient    Inocente Salles 09/04/2015, 11:38 AM

## 2015-09-04 NOTE — Progress Notes (Signed)
CSW met with MOB at baby's bedside in NICU to inform her of baby's eligibility for SSI due to gestational age and birth weight.  CSW explained that although baby qualifies, the benefit will only be retroactive to birth if someone can go to the [place] by the end of their business hours tomorrow, since tomorrow is the last day of the month.  CSW also explained that the benefit is only guaranteed while the baby is in the hospital as income is taken into account after discharge.  MOB stated understanding and was appreciative of the information.  She is interested in applying so CSW obtained consent to disclose protected health information and provided MOB with a copy of baby's Admission Summary.   CSW understands that baby is being transferred to [facility] today.  MOB's chart reviewed and no concerns noted.  MOB reports that she and baby are doing well.  She reports FOB is involved and supportive.  MOB states no questions, concerns or needs at this time.

## 2015-09-04 NOTE — Progress Notes (Signed)
Jasmine Flores   Subjective: Post Partum Day 1 Vaginal delivery, no laceration Patient up ad lib, denies syncope or dizziness. Reports consuming regular diet without issues and denies N/V No issues with urination and reports bleeding is appropriate  Feeding:  Breast pmping Contraceptive plan:   undecided  Objective: Temp:  [97.5 F (36.4 C)-98.8 F (37.1 C)] 98.7 F (37.1 C) (11/29 0600) Pulse Rate:  [74-114] 93 (11/29 0600) Resp:  [18] 18 (11/29 0600) BP: (104-163)/(61-106) 107/61 mmHg (11/29 0600) SpO2:  [100 %] 100 % (11/28 2141) Weight:  [135 lb (61.236 kg)] 135 lb (61.236 kg) (11/28 1747)  Physical Exam:  General: alert and cooperative Ext: WNL, no significant  edema. No evidence of DVT seen on physical exam. Breast: Soft filling Lungs: CTAB Heart RRR without murmur  Abdomen:  Soft, fundus firm, lochia scant, + bowel sounds, non distended, non tender Lochia: appropriate Uterine Fundus: firm Laceration: n/a    Recent Labs  09/03/15 1800 09/04/15 0525  HGB 13.2 11.0*  HCT 40.0 33.5*    Assessment S/P Vaginal Delivery-Day 1 Stable  Normal [employer] Breastpumping Baby in NICU  Plan: Continue current care Plan for discharge tomorrow Lactation support   Karsyn Jamie, CNM, MSN 09/04/2015, 1:20 PM

## 2015-09-04 NOTE — Lactation Note (Addendum)
This note was copied from the chart of Jasmine Garyn Bolla. Lactation Consultation Note  Patient Name: Jasmine Flores [id number] Date: 09/04/2015 Reason for consult: Follow-up assessment;NICU baby  NICU baby 47 hours old. Assisted mom in NICU to latch baby to right breast in cross-cradle position. Baby tongue-thrusting, so demonstrated suck training with this LC's gloved finger. Also removed baby's blanket and t-shirt and discussed with mom that baby will nurse better if STS. Mom able to latch baby deeply, baby able to maintain a deep latch, suckling rhythmically in bursts--taking short breaks with a few swallows noted. Demonstrated to mom how to continue to support her breast while baby nursing, and how to stimulate baby to continue suckling. Enc mom to pump when she returns to her room, so that she will be ready at next feeding for baby to go to breast and hopefully have colostrum flowing.   Mom's has small breast with well everted nipples, and baby able to latch well when alert. Maternal Data Has patient been taught Hand Expression?: Yes Does the patient have breastfeeding experience prior to this delivery?: No  Feeding Feeding Type: Breast Fed Nipple Type: Slow - flow Length of feed: 10 min  LATCH Score/Interventions Latch: Repeated attempts needed to sustain latch, nipple held in mouth throughout feeding, stimulation needed to elicit sucking reflex. Intervention(s): Adjust position;Assist with latch;Breast compression  Audible Swallowing: A few with stimulation Intervention(s): Skin to skin;Hand expression  Type of Nipple: Everted at rest and after stimulation  Comfort (Breast/Nipple): Soft / non-tender     Hold (Positioning): Assistance needed to correctly position infant at breast and maintain latch. Intervention(s): Breastfeeding basics reviewed;Support Pillows;Position options;Skin to skin  LATCH Score: 7  Lactation Tools Discussed/Used Pump Review: Setup,  frequency, and cleaning;Milk Storage Initiated by:: Bedside RN. Date initiated:: 09/03/15   Consult Status Consult Status: PRN Date: 09/05/15 Follow-up type: In-patient    Inocente Salles 09/04/2015, 12:25 PM

## 2015-09-04 NOTE — Anesthesia Postprocedure Evaluation (Signed)
Anesthesia Post Note  Patient: Jasmine Flores  Procedure(s) Performed: * No procedures listed *  Patient location during evaluation: [facility]'s Unit Level of consciousness: awake and awake and alert Pain management: pain level controlled Vital Signs Assessment: post-procedure vital signs reviewed and stable Respiratory status: spontaneous breathing Cardiovascular status: blood pressure returned to baseline Postop Assessment: no headache and no backache Anesthetic complications: no    Last Vitals:  Filed Vitals:   09/04/15 0212 09/04/15 0600  BP: 104/62 107/61  Pulse: 88 93  Temp: 36.9 C 37.1 C  Resp: 18 18    Last Pain:  Filed Vitals:   09/04/15 0644  PainSc: 0-No pain                 Cerita Rabelo

## 2015-09-05 MED ORDER — IBUPROFEN 600 MG PO TABS: 600.0000 mg | ORAL_TABLET | Freq: Four times a day (QID) | ORAL | Status: DC | PRN

## 2015-09-05 MED ORDER — PNEUMOCOCCAL VAC POLYVALENT 25 MCG/0.5ML IJ INJ
0.5000 mL | INJECTION | INTRAMUSCULAR | Status: AC
  Administered 2015-09-05: 0.5 mL via INTRAMUSCULAR
  Filled 2015-09-05 (×3): qty 0.5

## 2015-09-05 MED ORDER — OXYCODONE-ACETAMINOPHEN 5-325 MG PO TABS: 1.0000 | ORAL_TABLET | ORAL | Status: DC | PRN

## 2015-09-05 NOTE — Lactation Note (Signed)
This note was copied from the chart of Jasmine Flores. Lactation Consultation Note  Patient Name: Jasmine Flores [id number] Date: 09/05/2015 Reason for consult: Follow-up assessment;Infant < 6lbs Baby weighs <4lb  SGA baby 34 hours old and weighing 3lb 13 oz. Baby initially in NICU, then moved down to [facility] with mom last night. Mom states that baby was tongue-thrusting and not maintaining a deep latch, but then for the last several feeds, baby has been latching and nursing well. Discussed the need to put baby to breast with cues and to wake if baby not cueing by 3 hours. Enc mom to supplement with EBM/Formula according to supplementation guidelines, and then postpump. Discussed limiting baby's total feeding time (at each feeding) to 30 minutes. Discussed not over-stimulating baby, and the need to offer lots of STS. Discussed the need to consolidate care and be careful to let baby rest in between feedings especially until baby has gained weight.  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 Mom states her hormone levels were checked and were good after surgery, so enc mom to discuss with her HCP her postpartum levels and to watch her milk supply--mom is pumping and will know her EBM amounts.   Mom states that she has her DEBP at home. Enc mom to call for assistance as needed.     Maternal Data    Feeding Feeding Type: Breast Fed Length of feed: 20 min  LATCH Score/Interventions                      Lactation Tools Discussed/Used     Consult Status Consult Status: Follow-up Date: 09/06/15 Follow-up type: In-patient    Inocente Salles 09/05/2015, 12:12 PM

## 2015-09-05 NOTE — Lactation Note (Signed)
This note was copied from the chart of Jasmine Arista Barko. Lactation Consultation Note IUGR SGA, STS BF w/good tugging at breast, deep latch. BF for 30 min. Explained to mom not to BF but for 20 min. Max. And then has to supplement for 10 min. W/formula until her milk supply comes in adequate enough to supply baby. Explained uses up baby's energy to BF over 30 min. Encouraged [facility] care as much as possible. Discussed importance of post-pumping for breast stimulation. Hand expressed breast. No colostrum noted. Has puffy areolas and everted nipples. Stressed the need to supplement after BF 24 cal. Similac.  Patient Name: Jasmine Flores [id number] Date: 09/05/2015 Reason for consult: Follow-up assessment;Breast surgery;Infant < 6lbs;Late preterm infant   Maternal Data    Feeding Feeding Type: Formula Nipple Type: Slow - flow Length of feed: 30 min (encouraged not to BF over 20 min. then supplement 10 min.)  LATCH Score/Interventions Latch: Grasps breast easily, tongue down, lips flanged, rhythmical sucking.  Intervention(s): Skin to skin;Hand expression  Type of Nipple: Everted at rest and after stimulation  Comfort (Breast/Nipple): Soft / non-tender     Hold (Positioning): No assistance needed to correctly position infant at breast. Intervention(s): Breastfeeding basics reviewed;Support Pillows;Position options;Skin to skin     Lactation Tools Discussed/Used Tools: Pump Breast pump type: Double-Electric Breast Pump   Consult Status Consult Status: Follow-up Date: 09/05/15 Follow-up type: In-patient    Malana Eberwein, Elta Guadeloupe 09/05/2015, 4:02 AM

## 2015-09-05 NOTE — Discharge Summary (Signed)
OB Discharge Summary     Patient Name: Jasmine Flores DOB: [birth year] MRN: [id number]  Date of admission: 09/03/2015 Delivering MD: Gavin Pound   Date of discharge: 09/05/2015  Admitting diagnosis: 37 WK RUPTURE Intrauterine pregnancy: [redacted]w[redacted]d     Secondary diagnosis:  Principal Problem:   SVD (spontaneous vaginal delivery) Active Problems:   Labor and delivery complicated by meconium in amniotic fluid   Asthma   Hx of migraines   Allergy to shellfish   Chronic headache disorder   Normal labor  Additional problems: Small Gestational age infant     Discharge diagnosis: Term Pregnancy Delivered                                                                                                Post partum procedures:none  Augmentation: none  Complications: None  Hospital course:  Onset of Labor With Vaginal Delivery     29 y.o. yo G1P1001 at [redacted]w[redacted]d was admitted in Latent Laboron 09/03/2015. Patient had an uncomplicated labor course as follows:  Membrane Rupture Time/Date: 4:08 PM ,09/03/2015   Intrapartum Procedures: Episiotomy: None [1]                                         Lacerations:  None [1]  Patient had a delivery of a Viable infant. 09/03/2015  Information for the patient's newborn:  [name] Girl Annagrace [id number]       Pateint had an uncomplicated postpartum course.  She is ambulating, tolerating a regular diet, passing flatus, and urinating well. Patient is discharged home in stable condition on No discharge date for patient encounter.Marland Kitchen    Physical exam  Filed Vitals:   09/04/15 1411 09/04/15 1700 09/04/15 1830 09/05/15 0500  BP: 144/94 134/99 144/99 144/96  Pulse: 95 102 98 78  Temp:  98.3 F (36.8 C) 98 F (36.7 C) 97.9 F (36.6 C)  TempSrc:  Oral Oral Oral  Resp: 18 16 18 18   Height:      Weight:      SpO2: 100% 100%  100%   General: alert and cooperative Lochia: appropriate Uterine Fundus: firm Incision: Healing well with no  significant drainage DVT Evaluation: No evidence of DVT seen on physical exam. Negative Homan's sign. Labs: Lab Results  Component Value Date   WBC 9.8 09/04/2015   HGB 11.0* 09/04/2015   HCT 33.5* 09/04/2015   MCV 73.1* 09/04/2015   PLT 190 09/04/2015   CMP Latest Ref Rng 11/13/2014  Glucose 70 - 99 mg/dL 123(H)  BUN 6 - 23 mg/dL 21  Creatinine 0.50 - 1.10 mg/dL 0.64  Sodium 135 - 145 mmol/L 139  Potassium 3.5 - 5.1 mmol/L 3.8  Chloride 96 - 112 mmol/L 104  CO2 19 - 32 mmol/L 26  Calcium 8.4 - 10.5 mg/dL 9.0  Total Protein 6.0 - 8.3 g/dL 8.0  Total Bilirubin 0.3 - 1.2 mg/dL 0.8  Alkaline Phos 39 - 117 U/L 69  AST 0 - 37 U/L 24  ALT 0 -  35 U/L 13    Discharge instruction: per After Visit Summary and "Baby and Me Booklet".  After visit meds:    Medication List    ASK your doctor about these medications        acetaminophen 500 MG tablet  Commonly known as:  TYLENOL  Take 1,000 mg by mouth every 6 (six) hours as needed for headache.     albuterol 108 (90 BASE) MCG/ACT inhaler  Commonly known as:  PROVENTIL HFA;VENTOLIN HFA  Inhale 1 puff into the lungs every 6 (six) hours as needed for wheezing or shortness of breath.     aspirin-acetaminophen-caffeine 250-250-65 MG tablet  Commonly known as:  EXCEDRIN MIGRAINE  Take 1 tablet by mouth every 6 (six) hours as needed for headache.     [employer] PRENATAL GUMMY 0.4-113.5 MG Chew  Chew 2 tablets by mouth daily.     FUSION PLUS Caps  Take 1 capsule by mouth daily.     ondansetron 4 MG disintegrating tablet  Commonly known as:  ZOFRAN ODT  Take 1 tablet (4 mg total) by mouth every 8 (eight) hours as needed for nausea.        Diet: routine diet  Activity: Advance as tolerated. Pelvic rest for 6 weeks.   Outpatient follow up:6 weeks, Postpartum appointment Follow up Appt:No future appointments. Follow up Visit:No Follow-up on file.  Postpartum contraception: Condoms  Newborn Data: Live born female  Birth Weight: 3  lb 15.5 oz (1800 g) APGAR: 8, 9  Baby Feeding: Bottle and Breast Disposition:rooming in   09/05/2015 Lavetta Nielsen, CNM

## 2015-09-05 NOTE — Discharge Instructions (Signed)

## 2015-09-06 ENCOUNTER — Ambulatory Visit: Payer: Self-pay

## 2015-09-06 NOTE — Lactation Note (Signed)
This note was copied from the chart of Jasmine Flores. Lactation Consultation Note  Patient Name: Jasmine Flores [id number] Date: 09/06/2015 Reason for consult: Follow-up assessment  baby is 63 hours old , 1% weight loss. Breast feeding and supplementing afterwards .  Voids ans stools QS for age. Last stool at 1 am.  LC reviewed with mom the [facility] plan for a Early term infant < 4 pounds and potential feeding  Patterns, nutritive vs non - nutritive feeding patterns. Also if npt able to latch and it has been 3 hours since last feeding  To switch and try the supplement 1st and then latch.  Continue post pumping after feedings , and PRN if still feeding full when abby isn't cluster feeding.  Sore nipple and engorgement prevention and tx reviewed , per mom nipples are tender , LC assessed with moms permission,  Noted some cracking at the base of both nipples, mom already has comfort gels , and LC instructed mom  On the use hand pump and shells.  LC encouraged mom to use either olive oil or coconut oil to nipples and areolas , esp. Before pumping to decrease friction.  Mom willing to come back in for [facility] O/P appt. 12/8 at 4 pm. , Appt. Reminder given to mom.  Per mom has DEBp at home.  Mother informed of post-discharge support and given phone number to the lactation department, including services for phone call assistance; out-patient appointments; and breastfeeding support group. List of other breastfeeding resources in the community given in the handout. Encouraged mother to call for problems or concerns related to breastfeeding.  Maternal Data    Feeding Feeding Type: Bottle Fed - Formula Length of feed: 28 min (per mom )  LATCH Score/Interventions                Intervention(s): Breastfeeding basics reviewed     Lactation Tools Discussed/Used     Consult Status Consult Status: Follow-up Follow-up type: Out-patient    Myer Haff 09/06/2015, 9:59 AM

## 2015-09-13 ENCOUNTER — Ambulatory Visit (HOSPITAL_COMMUNITY)
Admission: RE | Admit: 2015-09-13 | Discharge: 2015-09-13 | Disposition: A | Discharge status: Home / Self Care | Payer: 59 | Source: Ambulatory Visit | Attending: Obstetrics & Gynecology | Admitting: Obstetrics & Gynecology

## 2015-09-13 ENCOUNTER — Ambulatory Visit (HOSPITAL_COMMUNITY): Payer: 59

## 2015-09-13 NOTE — Lactation Note (Signed)
Lactation Consult for Jasmine Flores (DOB: [birth year]) Mother: Jasmine Flores  Mother's reason for visit: "f/u from birth" Consult:  Initial Lactation Consultant:  Jasmine Flores  ________________________________________________________________________ BW: 1800g (3# 15oz) Wt on 09-06-15: 3# 15.8oz Wt on 09-07-15: 4# 2oz  Today's wt: 4# 6.3oz ________________________________________________________________________  Mother's Name: Jasmine Flores Type of delivery:  Vag Breastfeeding Experience: Primip Maternal Medical Conditions:  pituitary adenoma removed in 2008 Maternal Medications: PNV  ________________________________________________________________________  Breastfeeding History (Post Discharge)  Frequency of breastfeeding: "as often as possible." The longest span between feedings is 4 hours.  Duration of feeding: 15-30 min  Pumping  Type of pump:  Medela pump in style Frequency: 8 times/day for 15 min Volume: 60 ml/session  Baby receives a total of 2-3 oz of EBM in a bottle over a 24-hr time period. Sometimes she is satiated w/31mL after a breast feeding.  Infant Intake and Output Assessment  Voids: 8+ in 24 hrs.  Color:  Clear yellow Stools: 4+ in 24 hrs.  Color:  Yellow  ________________________________________________________________________  Maternal Breast Assessment  Breast:  Full Nipple:  Erect   _______________________________________________________________________ Feeding Assessment/Evaluation  Initial feeding assessment:  Infant's oral assessment:  WNL  Attached assessment:  Deep  Lips flanged:  Yes.     Suck assessment:  Nutritive  Pre-feed weight: 1994 g Post-feed weight: 2018 g  Amount transferred: 24 ml L breast, 12 min, cross-cradle  Pre-feed weight: 2018 g Post-feed weight: 2026 g  Amount transferred: 8 ml R breast, 5-6 min,  Pre-feed weight: 2026 g Post-feed weight: 2026 g  Amount transferred: 0 ml R breast,  3 min, NS added (size 20), football. The nipple shield did not improve activity at the breast, so it was discarded.  Pre-feed weight: 2026 g Post-feed weight: 2032g  Amount transferred: 6 ml L breast, 7 min bare breast, cross-cradle  Total amount transferred: 38 ml Total supplement given: 10 ml  Jasmine Flores is an early-term infant born at 1800g. She is now 21 days old & is almost 7 oz above BW.  Mom feeds her at the breast and then supplements as needed w/EBM (via a bottle). Jasmine Flores is exclusively breastmilk-fed. She last received formula 4 days ago.  Of note: Mom had a pituitary adenoma removed in 2008.   Mom has no breast complaints.  Jasmine Flores was noted to have excellent tongue mobility. Mom to f/u as needed. Mom reminded of our twice-weekly support groups.   Jasmine Dodge, RN, IBCLC

## 2015-10-16 DIAGNOSIS — Z1389 Encounter for screening for other disorder: Secondary | ICD-10-CM | POA: Diagnosis not present

## 2015-10-16 DIAGNOSIS — R87612 Low grade squamous intraepithelial lesion on cytologic smear of cervix (LGSIL): Secondary | ICD-10-CM | POA: Diagnosis not present

## 2015-10-16 DIAGNOSIS — Z3002 Counseling and instruction in natural family planning to avoid pregnancy: Secondary | ICD-10-CM | POA: Diagnosis not present

## 2015-10-16 DIAGNOSIS — N72 Inflammatory disease of cervix uteri: Secondary | ICD-10-CM | POA: Diagnosis not present

## 2015-10-16 DIAGNOSIS — N870 Mild cervical dysplasia: Secondary | ICD-10-CM | POA: Diagnosis not present

## 2015-10-16 LAB — HM PAP SMEAR

## 2016-09-08 ENCOUNTER — Other Ambulatory Visit: Payer: 59

## 2016-09-08 ENCOUNTER — Encounter: Payer: Self-pay | Admitting: Internal Medicine

## 2016-09-08 ENCOUNTER — Ambulatory Visit (INDEPENDENT_AMBULATORY_CARE_PROVIDER_SITE_OTHER): Payer: 59 | Admitting: Internal Medicine

## 2016-09-08 VITALS — BP 112/80 | HR 107 | Ht 65.0 in | Wt 109.8 lb

## 2016-09-08 DIAGNOSIS — Z8669 Personal history of other diseases of the nervous system and sense organs: Secondary | ICD-10-CM

## 2016-09-08 DIAGNOSIS — D352 Benign neoplasm of pituitary gland: Secondary | ICD-10-CM

## 2016-09-08 DIAGNOSIS — Z0000 Encounter for general adult medical examination without abnormal findings: Secondary | ICD-10-CM | POA: Diagnosis not present

## 2016-09-08 DIAGNOSIS — D509 Iron deficiency anemia, unspecified: Secondary | ICD-10-CM

## 2016-09-08 DIAGNOSIS — J4520 Mild intermittent asthma, uncomplicated: Secondary | ICD-10-CM

## 2016-09-08 MED ORDER — ACETAMINOPHEN-CAFFEINE 500-65 MG PO TABS: ORAL_TABLET | ORAL | 0 refills | Status: DC

## 2016-09-08 MED ORDER — ASPIRIN-ACETAMINOPHEN-CAFFEINE 250-250-65 MG PO TABS: 1.0000 | ORAL_TABLET | Freq: Four times a day (QID) | ORAL | 0 refills | Status: DC | PRN

## 2016-09-08 NOTE — Assessment & Plan Note (Signed)
Mild,intermittent Uses albuterol as needed, which is not often Will continue albuterol as needed

## 2016-09-08 NOTE — Assessment & Plan Note (Signed)
MRI due - will order

## 2016-09-08 NOTE — Patient Instructions (Addendum)
Test(s) ordered today. Your results will be released to [place] (or called to you) after review, usually within 72hours after test completion. If any changes need to be made, you will be notified at that same time.  All other Health Maintenance issues reviewed.   All recommended immunizations and age-appropriate screenings are up-to-date or discussed.  No immunizations administered today.   Medications reviewed and updated. No changes recommended at this time.   A MRI of your brain was ordered.  Please followup in one year    Health Maintenance, Female Introduction Adopting a healthy lifestyle and getting preventive care can go a long way to promote health and wellness. Talk with your health care provider about what schedule of regular examinations is right for you. This is a good chance for you to check in with your provider about disease prevention and staying healthy. In between checkups, there are plenty of things you can do on your own. Experts have done a lot of research about which lifestyle changes and preventive measures are most likely to keep you healthy. Ask your health care provider for more information. Weight and diet Eat a healthy diet  Be sure to include plenty of vegetables, fruits, low-fat dairy products, and lean protein.  Do not eat a lot of foods high in solid fats, added sugars, or salt.  Get regular exercise. This is one of the most important things you can do for your health.  Most adults should exercise for at least 150 minutes each week. The exercise should increase your heart rate and make you sweat (moderate-intensity exercise).  Most adults should also do strengthening exercises at least twice a week. This is in addition to the moderate-intensity exercise. Maintain a healthy weight  Body mass index (BMI) is a measurement that can be used to identify possible weight problems. It estimates body fat based on height and weight. Your health care provider  can help determine your BMI and help you achieve or maintain a healthy weight.  For females 66 years of age and older:  A BMI below 18.5 is considered underweight.  A BMI of 18.5 to 24.9 is normal.  A BMI of 25 to 29.9 is considered overweight.  A BMI of 30 and above is considered obese. Watch levels of cholesterol and blood lipids  You should start having your blood tested for lipids and cholesterol at 32 years of age, then have this test every 5 years.  You may need to have your cholesterol levels checked more often if:  Your lipid or cholesterol levels are high.  You are older than 32 years of age.  You are at high risk for heart disease. Cancer screening Lung Cancer  Lung cancer screening is recommended for adults 59-33 years old who are at high risk for lung cancer because of a history of smoking.  A yearly low-dose CT scan of the lungs is recommended for people who:  Currently smoke.  Have quit within the past 15 years.  Have at least a 30-pack-year history of smoking. A pack year is smoking an average of one pack of cigarettes a day for 1 year.  Yearly screening should continue until it has been 15 years since you quit.  Yearly screening should stop if you develop a health problem that would prevent you from having lung cancer treatment. Breast Cancer  Practice breast self-awareness. This means understanding how your breasts normally appear and feel.  It also means doing regular breast self-exams. Let your health care  provider know about any changes, no matter how small.  If you are in your 20s or 30s, you should have a clinical breast exam (CBE) by a health care provider every 1-3 years as part of a regular health exam.  If you are 46 or older, have a CBE every year. Also consider having a breast X-ray (mammogram) every year.  If you have a family history of breast cancer, talk to your health care provider about genetic screening.  If you are at high risk for  breast cancer, talk to your health care provider about having an MRI and a mammogram every year.  Breast cancer gene (BRCA) assessment is recommended for women who have family members with BRCA-related cancers. BRCA-related cancers include:  Breast.  Ovarian.  Tubal.  Peritoneal cancers.  Results of the assessment will determine the need for genetic counseling and BRCA1 and BRCA2 testing. Cervical Cancer  Your health care provider may recommend that you be screened regularly for cancer of the pelvic organs (ovaries, uterus, and vagina). This screening involves a pelvic examination, including checking for microscopic changes to the surface of your cervix (Pap test). You may be encouraged to have this screening done every 3 years, beginning at age 5.  For women ages 53-65, health care providers may recommend pelvic exams and Pap testing every 3 years, or they may recommend the Pap and pelvic exam, combined with testing for human papilloma virus (HPV), every 5 years. Some types of HPV increase your risk of cervical cancer. Testing for HPV may also be done on women of any age with unclear Pap test results.  Other health care providers may not recommend any screening for nonpregnant women who are considered low risk for pelvic cancer and who do not have symptoms. Ask your health care provider if a screening pelvic exam is right for you.  If you have had past treatment for cervical cancer or a condition that could lead to cancer, you need Pap tests and screening for cancer for at least 20 years after your treatment. If Pap tests have been discontinued, your risk factors (such as having a new sexual partner) need to be reassessed to determine if screening should resume. Some women have medical problems that increase the chance of getting cervical cancer. In these cases, your health care provider may recommend more frequent screening and Pap tests. Colorectal Cancer  This type of cancer can be  detected and often prevented.  Routine colorectal cancer screening usually begins at 32 years of age and continues through 32 years of age.  Your health care provider may recommend screening at an earlier age if you have risk factors for colon cancer.  Your health care provider may also recommend using home test kits to check for hidden blood in the stool.  A small camera at the end of a tube can be used to examine your colon directly (sigmoidoscopy or colonoscopy). This is done to check for the earliest forms of colorectal cancer.  Routine screening usually begins at age 1.  Direct examination of the colon should be repeated every 5-10 years through 32 years of age. However, you may need to be screened more often if early forms of precancerous polyps or small growths are found. Skin Cancer  Check your skin from head to toe regularly.  Tell your health care provider about any new moles or changes in moles, especially if there is a change in a mole's shape or color.  Also tell your health care  provider if you have a mole that is larger than the size of a pencil eraser.  Always use sunscreen. Apply sunscreen liberally and repeatedly throughout the day.  Protect yourself by wearing long sleeves, pants, a wide-brimmed hat, and sunglasses whenever you are outside. Heart disease, diabetes, and high blood pressure  High blood pressure causes heart disease and increases the risk of stroke. High blood pressure is more likely to develop in:  People who have blood pressure in the high end of the normal range (130-139/85-89 mm Hg).  People who are overweight or obese.  People who are African American.  If you are 13-42 years of age, have your blood pressure checked every 3-5 years. If you are 70 years of age or older, have your blood pressure checked every year. You should have your blood pressure measured twice-once when you are at a hospital or clinic, and once when you are not at a hospital  or clinic. Record the average of the two measurements. To check your blood pressure when you are not at a hospital or clinic, you can use:  An automated blood pressure machine at a pharmacy.  A home blood pressure monitor.  If you are between 44 years and 60 years old, ask your health care provider if you should take aspirin to prevent strokes.  Have regular diabetes screenings. This involves taking a blood sample to check your fasting blood sugar level.  If you are at a normal weight and have a low risk for diabetes, have this test once every three years after 32 years of age.  If you are overweight and have a high risk for diabetes, consider being tested at a younger age or more often. Preventing infection Hepatitis B  If you have a higher risk for hepatitis B, you should be screened for this virus. You are considered at high risk for hepatitis B if:  You were born in a country where hepatitis B is common. Ask your health care provider which countries are considered high risk.  Your parents were born in a high-risk country, and you have not been immunized against hepatitis B (hepatitis B vaccine).  You have HIV or AIDS.  You use needles to inject street drugs.  You live with someone who has hepatitis B.  You have had sex with someone who has hepatitis B.  You get hemodialysis treatment.  You take certain medicines for conditions, including cancer, organ transplantation, and autoimmune conditions. Hepatitis C  Blood testing is recommended for:  Everyone born from [birth year] through 1967.  Anyone with known risk factors for hepatitis C. Sexually transmitted infections (STIs)  You should be screened for sexually transmitted infections (STIs) including gonorrhea and chlamydia if:  You are sexually active and are younger than 32 years of age.  You are older than 32 years of age and your health care provider tells you that you are at risk for this type of infection.  Your sexual  activity has changed since you were last screened and you are at an increased risk for chlamydia or gonorrhea. Ask your health care provider if you are at risk.  If you do not have HIV, but are at risk, it may be recommended that you take a prescription medicine daily to prevent HIV infection. This is called pre-exposure prophylaxis (PrEP). You are considered at risk if:  You are sexually active and do not regularly use condoms or know the HIV status of your partner(s).  You take drugs by injection.  You are sexually active with a partner who has HIV. Talk with your health care provider about whether you are at high risk of being infected with HIV. If you choose to begin PrEP, you should first be tested for HIV. You should then be tested every 3 months for as long as you are taking PrEP. Pregnancy  If you are premenopausal and you may become pregnant, ask your health care provider about preconception counseling.  If you may become pregnant, take 400 to 800 micrograms (mcg) of folic acid every day.  If you want to prevent pregnancy, talk to your health care provider about birth control (contraception). Osteoporosis and menopause  Osteoporosis is a disease in which the bones lose minerals and strength with aging. This can result in serious bone fractures. Your risk for osteoporosis can be identified using a bone density scan.  If you are 48 years of age or older, or if you are at risk for osteoporosis and fractures, ask your health care provider if you should be screened.  Ask your health care provider whether you should take a calcium or vitamin D supplement to lower your risk for osteoporosis.  Menopause may have certain physical symptoms and risks.  Hormone replacement therapy may reduce some of these symptoms and risks. Talk to your health care provider about whether hormone replacement therapy is right for you. Follow these instructions at home:  Schedule regular health, dental, and  eye exams.  Stay current with your immunizations.  Do not use any tobacco products including cigarettes, chewing tobacco, or electronic cigarettes.  If you are pregnant, do not drink alcohol.  If you are breastfeeding, limit how much and how often you drink alcohol.  Limit alcohol intake to no more than 1 drink per day for nonpregnant women. One drink equals 12 ounces of beer, 5 ounces of wine, or 1 ounces of hard liquor.  Do not use street drugs.  Do not share needles.  Ask your health care provider for help if you need support or information about quitting drugs.  Tell your health care provider if you often feel depressed.  Tell your health care provider if you have ever been abused or do not feel safe at home. This information is not intended to replace advice given to you by your health care provider. Make sure you discuss any questions you have with your health care provider. Document Released: 04/07/2011 Document Revised: 02/28/2016 Document Reviewed: 06/26/2015  2017 [employer]vier

## 2016-09-08 NOTE — Assessment & Plan Note (Signed)
Related to heavy menses Not taking iron, but take prental Check cbc, iron/ferritin

## 2016-09-08 NOTE — Assessment & Plan Note (Signed)
Takes excedrin as needed

## 2016-09-08 NOTE — Progress Notes (Signed)
Subjective:    Patient ID: Jasmine Flores, female    DOB: [birth year], 30 y.o.   MRN: [id number]  HPI She is here to establish with a new pcp.  She is here for a physical exam.   She wanted to get established.  She has no concerns.   Medications and allergies reviewed with patient and updated if appropriate.  Patient Active Problem List   Diagnosis Date Noted  . Iron deficiency anemia 09/08/2016  . Pituitary adenoma ([place]) 09/08/2016  . Asthma 09/03/2015  . Hx of migraines 09/03/2015  . Allergy to shellfish 09/03/2015    Current Outpatient Prescriptions on File Prior to Visit  Medication Sig Dispense Refill  . albuterol (PROVENTIL HFA;VENTOLIN HFA) 108 (90 BASE) MCG/ACT inhaler Inhale 1 puff into the lungs every 6 (six) hours as needed for wheezing or shortness of breath.    . Prenatal Vit-Min-FA-Fish Oil ([facility] PRENATAL GUMMY) 0.4-113.5 MG CHEW Chew 2 tablets by mouth daily.     No current facility-administered medications on file prior to visit.     Past Medical History:  Diagnosis Date  . Asthma   . Headache   . Migraines   . Positive TB test     Past Surgical History:  Procedure Laterality Date  . 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 TONSILECTOMY, ADENOIDECTOMY, BILATERAL MYRINGOTOMY AND TUBES      Social History   Social History  . Marital status: Single    Spouse name: N/A  . Number of children: N/A  . Years of education: N/A   Social History Main Topics  . Smoking status: Never Smoker  . Smokeless tobacco: Never Used  . Alcohol use No  . Drug use: No  . Sexual activity: Yes    Birth control/ protection: None   Other Topics Concern  . None   Social History Narrative   Works at [employer]   71 months old     Family History  Problem Relation Age of Onset  . Diabetes Mother   . Hypertension Mother   . Diabetes Father   . Hypertension Father   . Stroke Father   . Hypertension Maternal Grandmother   . Diabetes Maternal Grandmother   .  Hypertension Maternal Grandfather   . Diabetes Maternal Grandfather     Review of Systems  Constitutional: Negative for appetite change, chills, fatigue, fever and unexpected weight change.  Eyes: Negative for visual disturbance.  Respiratory: Negative for cough, shortness of breath and wheezing.   Cardiovascular: Positive for palpitations (occ - ? anxiety). Negative for chest pain and leg swelling.  Gastrointestinal: Negative for abdominal pain, blood in stool, constipation, diarrhea and nausea.       No gerd  Genitourinary: Negative for dysuria and hematuria.  Musculoskeletal: Positive for back pain (intermittent - low). Negative for arthralgias.  Skin: Negative for color change and rash.  Neurological: Positive for light-headedness (with palpitations) and headaches (migraines). Negative for dizziness.  Psychiatric/Behavioral: Negative for dysphoric mood. The patient is nervous/anxious (occ).        Objective:   Vitals:   09/08/16 1507  BP: 112/80  Pulse: (!) 107   Filed Weights   09/08/16 1507  Weight: 109 lb 12.8 oz (49.8 kg)   Body mass index is 18.27 kg/m.   Physical Exam Constitutional: She appears well-developed and well-nourished. No distress.  HENT:  Head: Normocephalic and atraumatic.  Right Ear: External ear normal. Normal ear canal and TM Left Ear: External ear  normal.  Normal ear canal and TM Mouth/Throat: Oropharynx is clear and moist.  Eyes: Conjunctivae and EOM are normal.  Neck: Neck supple. No tracheal deviation present. No thyromegaly present.  No carotid bruit  Cardiovascular: Normal rate, regular rhythm and normal heart sounds.   No murmur heard.  No edema. Pulmonary/Chest: Effort normal and breath sounds normal. No respiratory distress. She has no wheezes. She has no rales.  Breast: deferred to Gyn Abdominal: Soft. She exhibits no distension. There is no tenderness.  Lymphadenopathy: She has no cervical adenopathy.  Skin: Skin is warm and dry.  She is not diaphoretic.  Psychiatric: She has a normal mood and affect. Her behavior is normal.      Assessment & Plan:   Physical exam: Screening blood work ordered Immunizations Up to date  Gyn  Up to date  Exercise -not regular, but very active, encouraged regular exercise Weight - on the low side, would ideally like to gain weight Skin - no concerns Substance abuse  none  See Problem List for Assessment and Plan of chronic medical problems.  F/u annually

## 2016-09-10 ENCOUNTER — Other Ambulatory Visit: Payer: Self-pay | Admitting: Internal Medicine

## 2016-09-10 DIAGNOSIS — D352 Benign neoplasm of pituitary gland: Secondary | ICD-10-CM

## 2016-09-11 ENCOUNTER — Other Ambulatory Visit (INDEPENDENT_AMBULATORY_CARE_PROVIDER_SITE_OTHER): Payer: 59

## 2016-09-11 ENCOUNTER — Encounter: Payer: Self-pay | Admitting: Internal Medicine

## 2016-09-11 DIAGNOSIS — D509 Iron deficiency anemia, unspecified: Secondary | ICD-10-CM | POA: Diagnosis not present

## 2016-09-11 DIAGNOSIS — Z0000 Encounter for general adult medical examination without abnormal findings: Secondary | ICD-10-CM | POA: Diagnosis not present

## 2016-09-11 LAB — CBC WITH DIFFERENTIAL/PLATELET
Basophils Absolute: 0 10*3/uL (ref 0.0–0.1)
Basophils Relative: 0.6 % (ref 0.0–3.0)
Eosinophils Absolute: 0.3 10*3/uL (ref 0.0–0.7)
Eosinophils Relative: 7.8 % — ABNORMAL HIGH (ref 0.0–5.0)
HCT: 36.7 % (ref 36.0–46.0)
HEMOGLOBIN: 12.1 g/dL (ref 12.0–15.0)
Lymphocytes Relative: 36.2 % (ref 12.0–46.0)
Lymphs Abs: 1.4 10*3/uL (ref 0.7–4.0)
MCHC: 33 g/dL (ref 30.0–36.0)
MCV: 74.7 fl — ABNORMAL LOW (ref 78.0–100.0)
MONO ABS: 0.3 10*3/uL (ref 0.1–1.0)
Monocytes Relative: 9 % (ref 3.0–12.0)
NEUTROS PCT: 46.4 % (ref 43.0–77.0)
Neutro Abs: 1.8 10*3/uL (ref 1.4–7.7)
Platelets: 412 10*3/uL — ABNORMAL HIGH (ref 150.0–400.0)
RBC: 4.91 Mil/uL (ref 3.87–5.11)
RDW: 14.7 % (ref 11.5–15.5)
WBC: 3.8 10*3/uL — AB (ref 4.0–10.5)

## 2016-09-11 LAB — LIPID PANEL
CHOLESTEROL: 166 mg/dL (ref 0–200)
HDL: 71.3 mg/dL (ref >39.00)
LDL CALC: 84 mg/dL (ref 0–99)
NONHDL: 94.7
Total CHOL/HDL Ratio: 2
Triglycerides: 56 mg/dL (ref 0.0–149.0)
VLDL: 11.2 mg/dL (ref 0.0–40.0)

## 2016-09-11 LAB — COMPREHENSIVE METABOLIC PANEL
ALBUMIN: 4.3 g/dL (ref 3.5–5.2)
ALT: 8 U/L (ref 0–35)
AST: 15 U/L (ref 0–37)
Alkaline Phosphatase: 65 U/L (ref 39–117)
BUN: 24 mg/dL — AB (ref 6–23)
CHLORIDE: 102 meq/L (ref 96–112)
CO2: 31 mEq/L (ref 19–32)
CREATININE: 0.69 mg/dL (ref 0.40–1.20)
Calcium: 9.6 mg/dL (ref 8.4–10.5)
GFR: 126.19 mL/min (ref >60.00)
GLUCOSE: 95 mg/dL (ref 70–99)
Potassium: 4.3 mEq/L (ref 3.5–5.1)
SODIUM: 140 meq/L (ref 135–145)
TOTAL PROTEIN: 8 g/dL (ref 6.0–8.3)
Total Bilirubin: 0.6 mg/dL (ref 0.2–1.2)

## 2016-09-11 LAB — TSH: TSH: 0.78 u[IU]/mL (ref 0.35–4.50)

## 2016-09-11 LAB — IRON: Iron: 43 ug/dL (ref 42–145)

## 2016-09-11 LAB — FERRITIN: FERRITIN: 8.2 ng/mL — AB (ref 10.0–291.0)

## 2016-09-24 ENCOUNTER — Other Ambulatory Visit: Payer: Self-pay | Admitting: Internal Medicine

## 2016-09-24 ENCOUNTER — Ambulatory Visit
Admission: RE | Admit: 2016-09-24 | Discharge: 2016-09-24 | Disposition: A | Discharge status: Home / Self Care | Payer: 59 | Source: Ambulatory Visit | Attending: Internal Medicine | Admitting: Internal Medicine

## 2016-09-24 DIAGNOSIS — D352 Benign neoplasm of pituitary gland: Secondary | ICD-10-CM

## 2016-09-24 DIAGNOSIS — R930 Abnormal findings on diagnostic imaging of skull and head, not elsewhere classified: Secondary | ICD-10-CM

## 2016-09-24 MED ORDER — GADOBENATE DIMEGLUMINE 529 MG/ML IV SOLN
6.0000 mL | Freq: Once | INTRAVENOUS | Status: AC | PRN
  Administered 2016-09-24: 6 mL via INTRAVENOUS

## 2016-09-25 ENCOUNTER — Telehealth: Payer: Self-pay | Admitting: Emergency Medicine

## 2016-09-25 NOTE — Telephone Encounter (Signed)
Spoke with pt to inform.  

## 2016-09-25 NOTE — Telephone Encounter (Signed)
Pt called and got her MRI results on her Mychart. She is wondering if you can give her a call back so she can ask a few questions about the results. Please advise thanks.

## 2016-10-02 ENCOUNTER — Ambulatory Visit
Admission: RE | Admit: 2016-10-02 | Discharge: 2016-10-02 | Disposition: A | Discharge status: Home / Self Care | Payer: 59 | Source: Ambulatory Visit | Attending: Internal Medicine | Admitting: Internal Medicine

## 2016-10-02 DIAGNOSIS — R930 Abnormal findings on diagnostic imaging of skull and head, not elsewhere classified: Secondary | ICD-10-CM

## 2016-10-02 DIAGNOSIS — D352 Benign neoplasm of pituitary gland: Secondary | ICD-10-CM

## 2016-10-02 DIAGNOSIS — G939 Disorder of brain, unspecified: Secondary | ICD-10-CM | POA: Diagnosis not present

## 2016-10-02 MED ORDER — IOPAMIDOL (ISOVUE-370) INJECTION 76%: 75.0000 mL | Freq: Once | INTRAVENOUS | Status: DC | PRN

## 2016-10-03 ENCOUNTER — Telehealth: Payer: Self-pay | Admitting: Internal Medicine

## 2016-10-03 NOTE — Telephone Encounter (Signed)
Patient is requesting results of CT

## 2016-10-03 NOTE — Telephone Encounter (Signed)
Please advise 

## 2016-10-05 ENCOUNTER — Encounter: Payer: Self-pay | Admitting: Internal Medicine

## 2016-10-05 NOTE — Telephone Encounter (Signed)
Sent via mychart

## 2016-11-25 DIAGNOSIS — Z01419 Encounter for gynecological examination (general) (routine) without abnormal findings: Secondary | ICD-10-CM | POA: Diagnosis not present

## 2016-11-25 DIAGNOSIS — Z113 Encounter for screening for infections with a predominantly sexual mode of transmission: Secondary | ICD-10-CM | POA: Diagnosis not present

## 2016-11-25 DIAGNOSIS — Z681 Body mass index (BMI) 19 or less, adult: Secondary | ICD-10-CM | POA: Diagnosis not present

## 2016-12-04 ENCOUNTER — Encounter: Payer: Self-pay | Admitting: Internal Medicine

## 2016-12-27 ENCOUNTER — Emergency Department (HOSPITAL_COMMUNITY)
Admission: EM | Admit: 2016-12-27 | Discharge: 2016-12-27 | Disposition: A | Discharge status: Home / Self Care | Payer: 59 | Attending: Emergency Medicine | Admitting: Emergency Medicine

## 2016-12-27 ENCOUNTER — Emergency Department (HOSPITAL_COMMUNITY): Payer: 59

## 2016-12-27 ENCOUNTER — Encounter (HOSPITAL_COMMUNITY): Payer: Self-pay | Admitting: Emergency Medicine

## 2016-12-27 DIAGNOSIS — J45909 Unspecified asthma, uncomplicated: Secondary | ICD-10-CM | POA: Diagnosis not present

## 2016-12-27 DIAGNOSIS — Z7982 Long term (current) use of aspirin: Secondary | ICD-10-CM | POA: Insufficient documentation

## 2016-12-27 DIAGNOSIS — R0602 Shortness of breath: Secondary | ICD-10-CM | POA: Diagnosis not present

## 2016-12-27 DIAGNOSIS — R0789 Other chest pain: Secondary | ICD-10-CM | POA: Diagnosis not present

## 2016-12-27 DIAGNOSIS — R079 Chest pain, unspecified: Secondary | ICD-10-CM | POA: Diagnosis not present

## 2016-12-27 LAB — CBC
HEMATOCRIT: 34.8 % — AB (ref 36.0–46.0)
HEMOGLOBIN: 11.1 g/dL — AB (ref 12.0–15.0)
MCH: 23.8 pg — ABNORMAL LOW (ref 26.0–34.0)
MCHC: 31.9 g/dL (ref 30.0–36.0)
MCV: 74.5 fL — ABNORMAL LOW (ref 78.0–100.0)
Platelets: 277 10*3/uL (ref 150–400)
RBC: 4.67 MIL/uL (ref 3.87–5.11)
RDW: 14.5 % (ref 11.5–15.5)
WBC: 4.6 10*3/uL (ref 4.0–10.5)

## 2016-12-27 LAB — BASIC METABOLIC PANEL
ANION GAP: 8 (ref 5–15)
BUN: 17 mg/dL (ref 6–20)
CO2: 26 mmol/L (ref 22–32)
Calcium: 8.9 mg/dL (ref 8.9–10.3)
Chloride: 105 mmol/L (ref 101–111)
Creatinine, Ser: 0.6 mg/dL (ref 0.44–1.00)
GFR calc Af Amer: >60 mL/min (ref >60)
GLUCOSE: 115 mg/dL — AB (ref 65–99)
POTASSIUM: 3.3 mmol/L — AB (ref 3.5–5.1)
Sodium: 139 mmol/L (ref 135–145)

## 2016-12-27 LAB — I-STAT TROPONIN, ED
Troponin i, poc: 0 ng/mL (ref 0.00–0.08)
Troponin i, poc: 0 ng/mL (ref 0.00–0.08)

## 2016-12-27 LAB — D-DIMER, QUANTITATIVE (NOT AT ARMC): D-Dimer, Quant: 0.81 ug/mL-FEU — ABNORMAL HIGH (ref 0.00–0.50)

## 2016-12-27 MED ORDER — IBUPROFEN 600 MG PO TABS: 600.0000 mg | ORAL_TABLET | Freq: Four times a day (QID) | ORAL | 0 refills | Status: DC | PRN

## 2016-12-27 MED ORDER — IOPAMIDOL (ISOVUE-370) INJECTION 76%
INTRAVENOUS | Status: AC
  Administered 2016-12-27: 80 mL
  Filled 2016-12-27: qty 100

## 2016-12-27 MED ORDER — KETOROLAC TROMETHAMINE 30 MG/ML IJ SOLN
30.0000 mg | Freq: Once | INTRAMUSCULAR | Status: AC
  Administered 2016-12-27: 30 mg via INTRAVENOUS
  Filled 2016-12-27: qty 1

## 2016-12-27 NOTE — ED Provider Notes (Signed)
[facility] DEPT Provider Note   CSN: [id number] Arrival date & time: 12/27/16  0541     History   Chief Complaint Chief Complaint  Patient presents with  . Chest Pain    HPI Jasmine Flores is a 31 y.o. female history of asthma, migraines who presents with chest pain that woke patient up around 3 AM. Patient describes her pain as a central pressure that has been intermittent lasting for a few minutes at a time. Patient reported some radiation of pain to her left arm intermittently. Her pain was not pleuritic. Patient reports her pain has subsided, however she still had some shortness of breath. Patient felt like it may have been because she felt anxious, however the symptoms have not been completely resolved since she has calmed down. Patient reports she has experienced tachycardia with anxiety before, but never chest pain. Patient did not take any medicine for anxiety. She has felt anxious intermittently for the past 15 months since her father died and her daughter was born. Patient also reported a headache that felt like the patient's typical migraines with associated nausea, which have resolved with Excedrin. Patient denies any fever, numbness or tingling, abdominal pain, vomiting, urinary symptoms. Patient reports that her symptoms do not feel like her typical asthma attacks. She denies any drug or alcohol use. Patient denies any recent long trips, surgeries, cancer, exogenous estrogen use, new leg pain or swelling.  HPI  Past Medical History:  Diagnosis Date  . Asthma   . Headache   . Migraines   . Positive TB test     Patient Active Problem List   Diagnosis Date Noted  . Iron deficiency anemia 09/08/2016  . Pituitary adenoma ([place]) 09/08/2016  . Asthma 09/03/2015  . Hx of migraines 09/03/2015  . Allergy to shellfish 09/03/2015    Past Surgical History:  Procedure Laterality Date  . 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 TONSILECTOMY, ADENOIDECTOMY, BILATERAL  MYRINGOTOMY AND TUBES      OB History    Gravida Para Term Preterm AB Living   1 1 1  0 0 1   SAB TAB Ectopic Multiple Live Births   0 0 0 0 1       Home Medications    Prior to Admission medications   Medication Sig Start Date End Date Taking? Authorizing Provider  albuterol (PROVENTIL HFA;VENTOLIN HFA) 108 (90 BASE) MCG/ACT inhaler Inhale 1 puff into the lungs every 6 (six) hours as needed for wheezing or shortness of breath.   Yes Historical Provider, MD  aspirin-acetaminophen-caffeine (EXCEDRIN MIGRAINE) [phone] MG tablet Take 1 tablet by mouth every 6 (six) hours as needed for headache. 09/08/16  Yes Binnie Rail, MD  Multiple Vitamin (MULTIVITAMIN WITH MINERALS) TABS tablet Take 1 tablet by mouth daily.   Yes Historical Provider, MD  Acetaminophen-Caffeine 500-65 MG TABS As needed migraines Patient not taking: Reported on 12/27/2016 09/08/16   Binnie Rail, MD  ibuprofen (ADVIL,MOTRIN) 600 MG tablet Take 1 tablet (600 mg total) by mouth every 6 (six) hours as needed. 12/27/16   Frederica Kuster, PA-C    Family History Family History  Problem Relation Age of Onset  . Diabetes Mother   . Hypertension Mother   . Diabetes Father   . Hypertension Father   . Stroke Father   . Hypertension Maternal Grandmother   . Diabetes Maternal Grandmother   . Hypertension Maternal Grandfather   . Diabetes Maternal Grandfather     Social  History Social History  Substance Use Topics  . Smoking status: Never Smoker  . Smokeless tobacco: Never Used  . Alcohol use No     Allergies   Shellfish allergy   Review of Systems Review of Systems  Constitutional: Negative for chills and fever.  HENT: Negative for facial swelling and sore throat.   Respiratory: Positive for shortness of breath.   Cardiovascular: Positive for chest pain.  Gastrointestinal: Positive for nausea. Negative for abdominal pain and vomiting.  Genitourinary: Negative for dysuria.  Musculoskeletal: Negative for  back pain.  Skin: Negative for rash and wound.  Neurological: Positive for headaches.  Psychiatric/Behavioral: The patient is not nervous/anxious.      Physical Exam Updated Vital Signs BP 115/90   Pulse 86   Temp 97.4 F (36.3 C) (Oral)   Resp 13   Ht 5\' 5"  (1.651 m)   Wt 49.9 kg   LMP 11/29/2016   SpO2 100%   BMI 18.30 kg/m   Physical Exam  Constitutional: She appears well-developed and well-nourished. No distress.  HENT:  Head: Normocephalic and atraumatic.  Mouth/Throat: Oropharynx is clear and moist. No oropharyngeal exudate.  Eyes: Conjunctivae and EOM are normal. Pupils are equal, round, and reactive to light. Right eye exhibits no discharge. Left eye exhibits no discharge. No scleral icterus.  Neck: Normal range of motion. Neck supple. No thyromegaly present.  Cardiovascular: Regular rhythm, normal heart sounds and intact distal pulses.  Tachycardia present.  Exam reveals no gallop and no friction rub.   No murmur heard. Pulmonary/Chest: Effort normal and breath sounds normal. No stridor. No respiratory distress. She has no wheezes. She has no rales. She exhibits tenderness.    Abdominal: Soft. Bowel sounds are normal. She exhibits no distension. There is no tenderness. There is no rebound and no guarding.  Musculoskeletal: She exhibits no edema.  Lymphadenopathy:    She has no cervical adenopathy.  Neurological: She is alert. Coordination normal.  CN 3-12 intact; normal sensation throughout; 5/5 strength in all 4 extremities; equal bilateral grip strength; no ataxia on finger to nose  Skin: Skin is warm and dry. No rash noted. She is not diaphoretic. No pallor.  Psychiatric: She has a normal mood and affect.  Nursing note and vitals reviewed.    ED Treatments / Results  Labs (all labs ordered are listed, but only abnormal results are displayed) Labs Reviewed  BASIC METABOLIC PANEL - Abnormal; Notable for the following:       Result Value   Potassium 3.3 (*)     Glucose, Bld 115 (*)    All other components within normal limits  CBC - Abnormal; Notable for the following:    Hemoglobin 11.1 (*)    HCT 34.8 (*)    MCV 74.5 (*)    MCH 23.8 (*)    All other components within normal limits  D-DIMER, QUANTITATIVE (NOT AT [facility]) - Abnormal; Notable for the following:    D-Dimer, Quant 0.81 (*)    All other components within normal limits  I-STAT TROPOININ, ED  POC URINE PREG, ED  I-STAT TROPOININ, ED    EKG  EKG Interpretation  Date/Time:  Saturday December 27 2016 05:47:14 EDT Ventricular Rate:  106 PR Interval:  184 QRS Duration: 68 QT Interval:  346 QTC Calculation: 459 R Axis:   69 Text Interpretation:  Sinus tachycardia Biatrial enlargement Nonspecific ST and T wave abnormality Abnormal ECG No previous ECGs available Confirmed by Christy Gentles  MD, DONALD ([id number]) on 12/27/2016 6:03:29  AM Also confirmed by Christy Gentles  MD, [place] ([id number]), editor Drema Pry [phone])  on 12/27/2016 9:24:55 AM       Radiology Dg Chest 2 View  Result Date: 12/27/2016 CLINICAL DATA:  Initial evaluation for intermittent chest pain, shortness of breath. EXAM: CHEST  2 VIEW COMPARISON:  None. FINDINGS: The cardiac and mediastinal silhouettes are within normal limits. The lungs are normally inflated. No airspace consolidation, pleural effusion, or pulmonary edema is identified. There is no pneumothorax. No acute osseous abnormality identified. IMPRESSION: No active cardiopulmonary disease. Electronically Signed   By: Jeannine Boga M.D.   On: 12/27/2016 07:14   Ct Angio Chest Pe W And/or Wo Contrast  Result Date: 12/27/2016 CLINICAL DATA:  Patient with chest pain and shortness of breath. Elevated D-dimer. EXAM: CT ANGIOGRAPHY CHEST WITH CONTRAST TECHNIQUE: Multidetector CT imaging of the chest was performed using the standard protocol during bolus administration of intravenous contrast. Multiplanar CT image reconstructions and MIPs were obtained to evaluate  the vascular anatomy. CONTRAST:  80 cc Isovue 370 COMPARISON:  Chest radiograph earlier same day FINDINGS: Cardiovascular: Adequate opacification of the pulmonary arterial system. No filling defects are identified to suggest pulmonary embolus. Normal heart size. No pericardial effusion. Aorta and main pulmonary artery are normal in caliber. Mediastinum/Nodes: No enlarged axillary, mediastinal or hilar lymphadenopathy. Esophagus is unremarkable. Lungs/Pleura: Central airways are patent. Dependent atelectasis within the lower lobes bilaterally. 3 mm left upper lobe pulmonary nodule. 7 mm nodule within the right middle lobe (image 62; series 43). No pleural effusion or pneumothorax. Upper Abdomen: Unremarkable. Musculoskeletal: Unremarkable. No aggressive or acute appearing osseous lesions. Review of the MIP images confirms the above findings. IMPRESSION: No evidence for pulmonary embolus. 7 mm nodule within the right middle lobe and 3 mm nodule left upper lobe. Recommend follow-up chest CT in 6-12 months to assess for stability. Electronically Signed   By: Lovey Newcomer M.D.   On: 12/27/2016 10:08    Procedures Procedures (including critical care time)  Medications Ordered in ED Medications  ketorolac (TORADOL) 30 MG/ML injection 30 mg (not administered)  iopamidol (ISOVUE-370) 76 % injection (80 mLs  Contrast Given 12/27/16 0921)     Initial Impression / Assessment and Plan / ED Course  I have reviewed the triage vital signs and the nursing notes.  Pertinent labs & imaging results that were available during my care of the patient were reviewed by me and considered in my medical decision making (see chart for details).      0710 On reevaluation, patient states her chest pain was returning. Heart rate has decreased to ~85-95.  1015 Patient requesting pain medication for chest pain that has intermittently returned. Will give Toradol.  Suspect musculoskeletal or anxiety cause for . CBC shows stable  chronic anemia, hemoglobin 11.1. BMP shows potassium 3.3, glucose 1:15. Delta troponin negative. D-dimer 0.81. CT angio shows no evidence for PE; 2 <1cm lung nodules recommending follow-up CT chest in 6-12 months. I made patient aware of this. EKG improved after tachycardia improved. Repeat EKG shows NSR, borderline T-wave abnormalities. Patient sitting comfortably on the bed on my reevaluation with stable vitals. We'll discharge patient home with supportive treatment including ibuprofen. Follow-up to PCP in 2-3 days. Strict return precautions given. Patient understands and agrees with plan. Patient vitals stable throughout ED course and discharged in satisfactory condition.  Final Clinical Impressions(s) / ED Diagnoses   Final diagnoses:  Nonspecific chest pain  Shortness of breath    New Prescriptions New Prescriptions  IBUPROFEN (ADVIL,MOTRIN) 600 MG TABLET    Take 1 tablet (600 mg total) by mouth every 6 (six) hours as needed.     Frederica Kuster, PA-C 12/27/16 [place], MD 12/28/16 0100

## 2016-12-27 NOTE — ED Triage Notes (Signed)
Pt complaining of intermittent chest pain ongoing since this morning, states resting at time of onset. Reports some shortness of breath, able to speak in full sentences. Reports some nausea and headache. Pt states she took some Excedrin for a headache,   Hx of asthma and migraines. LMP Feb 20th

## 2016-12-27 NOTE — Discharge Instructions (Signed)
Medications: Ibuprofen  Treatment: Take ibuprofen every 6 hours as needed for your chest pain.  Follow-up: Please follow-up with your primary care provider in 2-3 days for follow-up of today's visit and further evaluation and treatment of your symptoms. Please return to the emergency department immediately if you develop any new or worsening symptoms including worsening chest pain or shortness of breath. There were 2 lung nodules found on your CT today. The radiologist recommends that you have a repeat CT in 6-12 months to recheck these ("7 mm nodule within the right middle lobe and 3 mm nodule left upper lobe. Recommend follow-up chest CT in 6-12 months to assess for stability."). Please make your primary care provider aware.

## 2016-12-28 DIAGNOSIS — IMO0001 Reserved for inherently not codable concepts without codable children: Secondary | ICD-10-CM | POA: Insufficient documentation

## 2016-12-28 DIAGNOSIS — R911 Solitary pulmonary nodule: Secondary | ICD-10-CM | POA: Insufficient documentation

## 2016-12-28 DIAGNOSIS — R0789 Other chest pain: Secondary | ICD-10-CM | POA: Insufficient documentation

## 2016-12-28 NOTE — Progress Notes (Signed)
Subjective:    Patient ID: Jasmine Flores, female    DOB: 10-22-1985, 31 y.o.   MRN: [id number]  HPI The patient is here for follow up from the ED.  She went to the ED 12/27/16 for chest pain.  The chest pain started that morning around 3am and woke her up.  The pain was in her central chest and was intermittent, lasting minutes.  She had some radiation to the left arm intermittently.  Her pain subsided but she did have some SOB, but felt that was related to anxiety.   Her EKG in the ED was WNL.  Troponin was negative.  CTA of the chest showed no PE - there was 2 < 1cm lung nodules and follow up CT in 6-12 months was recommended.The pain was thought to be musculoskeletal or anxiety related.    She still has the chest pain.  The pain is a pressure like pain. The pain is sternal pain, sometimes under her left breast and sometimes in her left arm.  It is intermittent.  Walking and moving causes the pain.  The pain varies - it can be mild to moderate in nature.  Nothing seems to help.  She stopped the ibuprofen because she takes Excedrin for migraines - it did not seem to help.    She is nervous and concerned about having the pain.  She thought the pain was getting better, but today she felt it more.  It is not associated with anything in particular.   Medications and allergies reviewed with patient and updated if appropriate.  Patient Active Problem List   Diagnosis Date Noted  . Lung nodule < 6cm on CT 12/28/2016  . Other chest pain 12/28/2016  . Iron deficiency anemia 09/08/2016  . Pituitary adenoma ([place]) 09/08/2016  . Asthma 09/03/2015  . Hx of migraines 09/03/2015  . Allergy to shellfish 09/03/2015    Current Outpatient Prescriptions on File Prior to Visit  Medication Sig Dispense Refill  . Acetaminophen-Caffeine 500-65 MG TABS As needed migraines 60 each 0  . albuterol (PROVENTIL HFA;VENTOLIN HFA) 108 (90 BASE) MCG/ACT inhaler Inhale 1 puff into the lungs every 6 (six) hours  as needed for wheezing or shortness of breath.    Marland Kitchen aspirin-acetaminophen-caffeine (EXCEDRIN MIGRAINE) 250-250-65 MG tablet Take 1 tablet by mouth every 6 (six) hours as needed for headache. 30 tablet 0  . ibuprofen (ADVIL,MOTRIN) 600 MG tablet Take 1 tablet (600 mg total) by mouth every 6 (six) hours as needed. 30 tablet 0  . Multiple Vitamin (MULTIVITAMIN WITH MINERALS) TABS tablet Take 1 tablet by mouth daily.     No current facility-administered medications on file prior to visit.     Past Medical History:  Diagnosis Date  . Asthma   . Headache   . Migraines   . Positive TB test     Past Surgical History:  Procedure Laterality Date  . 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 TONSILECTOMY, ADENOIDECTOMY, BILATERAL MYRINGOTOMY AND TUBES      Social History   Social History  . Marital status: Single    Spouse name: N/A  . Number of children: N/A  . Years of education: N/A   Social History Main Topics  . Smoking status: Never Smoker  . Smokeless tobacco: Never Used  . Alcohol use No  . Drug use: No  . Sexual activity: Yes    Birth control/ protection: None   Other Topics Concern  . Not on  file   Social History Narrative   Works at [employer]   43 months old     Family History  Problem Relation Age of Onset  . Diabetes Mother   . Hypertension Mother   . Diabetes Father   . Hypertension Father   . Stroke Father   . Hypertension Maternal Grandmother   . Diabetes Maternal Grandmother   . Hypertension Maternal Grandfather   . Diabetes Maternal Grandfather     Review of Systems  Constitutional: Negative for chills and fever.  Respiratory: Positive for shortness of breath (little bit the night before last). Negative for cough and wheezing.   Cardiovascular: Positive for chest pain. Negative for palpitations and leg swelling.  Gastrointestinal: Negative for abdominal pain.       No gerd  Neurological: Positive for headaches. Negative for dizziness and  light-headedness.       Objective:   Vitals:   12/30/16 1432  BP: 110/70  Pulse: 84  Temp: 98 F (36.7 C)   Wt Readings from Last 3 Encounters:  12/30/16 115 lb 1.3 oz (52.2 kg)  12/27/16 110 lb (49.9 kg)  09/08/16 109 lb 12.8 oz (49.8 kg)   Body mass index is 19.15 kg/m.   Physical Exam    Constitutional: Appears well-developed and well-nourished. No distress.  HENT:  Head: Normocephalic and atraumatic.  Neck: Neck supple. No tracheal deviation present. No thyromegaly present.  No cervical lymphadenopathy Cardiovascular: Normal rate, regular rhythm and normal heart sounds.   No murmur heard. No carotid bruit .  No edema Pulmonary/Chest: Chest non tender to palpation.  Effort normal and breath sounds normal. No respiratory distress. No has no wheezes. No rales. Msk: no increased pain with movement.   Skin: Skin is warm and dry. Not diaphoretic.  Psychiatric: Normal mood and affect. Behavior is normal.      Assessment & Plan:    See Problem List for Assessment and Plan of chronic medical problems.

## 2016-12-28 NOTE — Assessment & Plan Note (Addendum)
12/27/16 Ct showed 2 nodules < 1 cm - will repeat in 6 months to assess stability She is low risk, no smoking history

## 2016-12-30 ENCOUNTER — Encounter: Payer: Self-pay | Admitting: Internal Medicine

## 2016-12-30 ENCOUNTER — Ambulatory Visit (INDEPENDENT_AMBULATORY_CARE_PROVIDER_SITE_OTHER): Payer: 59 | Admitting: Internal Medicine

## 2016-12-30 VITALS — BP 110/70 | HR 84 | Temp 98.0°F | Ht 65.0 in | Wt 115.1 lb

## 2016-12-30 DIAGNOSIS — IMO0001 Reserved for inherently not codable concepts without codable children: Secondary | ICD-10-CM

## 2016-12-30 DIAGNOSIS — R0789 Other chest pain: Secondary | ICD-10-CM | POA: Diagnosis not present

## 2016-12-30 DIAGNOSIS — R911 Solitary pulmonary nodule: Secondary | ICD-10-CM

## 2016-12-30 MED ORDER — ALBUTEROL SULFATE HFA 108 (90 BASE) MCG/ACT IN AERS: 1.0000 | INHALATION_SPRAY | Freq: Four times a day (QID) | RESPIRATORY_TRACT | 11 refills | Status: DC | PRN

## 2016-12-30 NOTE — Assessment & Plan Note (Signed)
Results for ED reviewed - CTA neg for PE, lung nodules incidental finding - will follow up EKG, blood work negative ? Related to anxiety or musculoskeletal - not fitting either Symptomatic treatment now - just monitor She will watch the pattern of the pain to help identify a cause  She will let me know if her pain does not resolve

## 2016-12-30 NOTE — Progress Notes (Signed)
Pre visit review using our clinic review tool, if applicable. No additional management support is needed unless otherwise documented below in the visit note. 

## 2016-12-30 NOTE — Patient Instructions (Addendum)
Treat your chest pain symptomatically - if it does not resolve please let me know.   A Ct scan was ordered to follow up your lung nodules - it should be done September or October 2018 \.

## 2017-01-31 DIAGNOSIS — H52223 Regular astigmatism, bilateral: Secondary | ICD-10-CM | POA: Diagnosis not present

## 2017-06-22 ENCOUNTER — Ambulatory Visit (INDEPENDENT_AMBULATORY_CARE_PROVIDER_SITE_OTHER): Payer: 59 | Admitting: Internal Medicine

## 2017-06-22 ENCOUNTER — Telehealth: Payer: Self-pay | Admitting: Internal Medicine

## 2017-06-22 ENCOUNTER — Other Ambulatory Visit (INDEPENDENT_AMBULATORY_CARE_PROVIDER_SITE_OTHER): Payer: 59

## 2017-06-22 ENCOUNTER — Encounter: Payer: Self-pay | Admitting: Internal Medicine

## 2017-06-22 VITALS — BP 146/96 | HR 101 | Temp 98.1°F | Resp 16 | Wt 109.0 lb

## 2017-06-22 DIAGNOSIS — E010 Iodine-deficiency related diffuse (endemic) goiter: Secondary | ICD-10-CM

## 2017-06-22 DIAGNOSIS — R0789 Other chest pain: Secondary | ICD-10-CM

## 2017-06-22 DIAGNOSIS — M2550 Pain in unspecified joint: Secondary | ICD-10-CM

## 2017-06-22 DIAGNOSIS — R002 Palpitations: Secondary | ICD-10-CM | POA: Insufficient documentation

## 2017-06-22 LAB — CBC WITH DIFFERENTIAL/PLATELET
Basophils Absolute: 0 K/uL (ref 0.0–0.1)
Basophils Relative: 0.8 % (ref 0.0–3.0)
Eosinophils Absolute: 0.3 K/uL (ref 0.0–0.7)
Eosinophils Relative: 7.2 % — ABNORMAL HIGH (ref 0.0–5.0)
HCT: 32.5 % — ABNORMAL LOW (ref 36.0–46.0)
Hemoglobin: 10.5 g/dL — ABNORMAL LOW (ref 12.0–15.0)
Lymphocytes Relative: 25.6 % (ref 12.0–46.0)
Lymphs Abs: 1.1 K/uL (ref 0.7–4.0)
MCHC: 32.2 g/dL (ref 30.0–36.0)
MCV: 71.1 fl — ABNORMAL LOW (ref 78.0–100.0)
Monocytes Absolute: 0.4 K/uL (ref 0.1–1.0)
Monocytes Relative: 9.8 % (ref 3.0–12.0)
Neutro Abs: 2.4 K/uL (ref 1.4–7.7)
Neutrophils Relative %: 56.6 % (ref 43.0–77.0)
Platelets: 350 K/uL (ref 150.0–400.0)
RBC: 4.57 Mil/uL (ref 3.87–5.11)
RDW: 17.2 % — ABNORMAL HIGH (ref 11.5–15.5)
WBC: 4.3 K/uL (ref 4.0–10.5)

## 2017-06-22 LAB — COMPREHENSIVE METABOLIC PANEL
ALBUMIN: 4.4 g/dL (ref 3.5–5.2)
ALK PHOS: 50 U/L (ref 39–117)
ALT: 10 U/L (ref 0–35)
AST: 15 U/L (ref 0–37)
BILIRUBIN TOTAL: 0.5 mg/dL (ref 0.2–1.2)
BUN: 14 mg/dL (ref 6–23)
CO2: 28 mEq/L (ref 19–32)
Calcium: 9.6 mg/dL (ref 8.4–10.5)
Chloride: 102 mEq/L (ref 96–112)
Creatinine, Ser: 0.67 mg/dL (ref 0.40–1.20)
GFR: 129.92 mL/min (ref >60.00)
Glucose, Bld: 97 mg/dL (ref 70–99)
POTASSIUM: 4 meq/L (ref 3.5–5.1)
Sodium: 138 mEq/L (ref 135–145)
Total Protein: 8 g/dL (ref 6.0–8.3)

## 2017-06-22 LAB — TSH: TSH: 0.5 u[IU]/mL (ref 0.35–4.50)

## 2017-06-22 LAB — SEDIMENTATION RATE: Sed Rate: 20 mm/hr (ref 0–20)

## 2017-06-22 NOTE — Telephone Encounter (Signed)
Pt requested appointment through [facility] for chest pressure, I called her and she stated it has been on and off since march, I transferred her to team health to be triaged.

## 2017-06-22 NOTE — Progress Notes (Signed)
Subjective:    Patient ID: Jasmine Flores, female    DOB: [birth year], 31 y.o.   MRN: [id number]  HPI She is here for an acute visit.   Chest pain:  She has central chest pressure and palpitations. this started in March or shortly before that.  The chest pressure is 1-2 times a week.  It will be intermittent throughout the day. It occurs randomly. It does not occur after she eats. There is no association with position changes.  Nothing makes the pain better or worse.  Excedrin or advil do not help.  The palpitations will go away with a few deep breaths.  They occur with the chest pressure.   They are intermittent throughout the day about 1-2 times a week - the same as the chest pressure.  When the pressure and palpitations she feels like there is a lump in her throat.   Her chest pressure and palpitations are more often and lasting longer compared to several months ago.  She has had some mild SOB recently, but is unsure if her daughter gave her a cold or if it is her asthma.  Her inhaler kind of helps.  Prior to recently she has not had SOB.    On her watch her HR elevates to the 150-170's.  She is unsure if that is related to her palpitations or not - she forgets to check her watch when she is having the palpitations.    She denies anxiety.  She denies GERD, abdominal pain. She does have intermittent joint pain and wonders about an autoimmune disease.   Medications and allergies reviewed with patient and updated if appropriate.  Patient Active Problem List   Diagnosis Date Noted  . Palpitations 06/22/2017  . 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 Joint pain 06/22/2017  . Lung nodule < 6cm on CT 12/28/2016  . Other chest pain 12/28/2016  . Iron deficiency anemia 09/08/2016  . Pituitary adenoma ([place]) 09/08/2016  . Asthma 09/03/2015  . Hx of migraines 09/03/2015  . Allergy to shellfish 09/03/2015    Current Outpatient Prescriptions on File Prior to Visit  Medication Sig Dispense Refill    . Acetaminophen-Caffeine 500-65 MG TABS As needed migraines 60 each 0  . albuterol (PROVENTIL HFA;VENTOLIN HFA) 108 (90 Base) MCG/ACT inhaler Inhale 1 puff into the lungs every 6 (six) hours as needed for wheezing or shortness of breath. 1 Inhaler 11  . aspirin-acetaminophen-caffeine (EXCEDRIN MIGRAINE) 250-250-65 MG tablet Take 1 tablet by mouth every 6 (six) hours as needed for headache. 30 tablet 0  . Multiple Vitamin (MULTIVITAMIN WITH MINERALS) TABS tablet Take 1 tablet by mouth daily.     No current facility-administered medications on file prior to visit.     Past Medical History:  Diagnosis Date  . Asthma   . Headache   . Migraines   . Positive TB test     Past Surgical History:  Procedure Laterality Date  . 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 TONSILECTOMY, ADENOIDECTOMY, BILATERAL MYRINGOTOMY AND TUBES      Social History   Social History  . Marital status: Single    Spouse name: N/A  . Number of children: N/A  . Years of education: N/A   Social History Main Topics  . Smoking status: Never Smoker  . Smokeless tobacco: Never Used  . Alcohol use No  . Drug use: No  . Sexual activity: Yes    Birth control/ protection: None   Other Topics  Concern  . Not on file   Social History Narrative   Works at [employer]   56 months old     Family History  Problem Relation Age of Onset  . Diabetes Mother   . Hypertension Mother   . Diabetes Father   . Hypertension Father   . Stroke Father   . Hypertension Maternal Grandmother   . Diabetes Maternal Grandmother   . Hypertension Maternal Grandfather   . Diabetes Maternal Grandfather     Review of Systems  Constitutional: Negative for chills and fever.  HENT: Negative for sore throat and trouble swallowing.   Respiratory: Positive for cough (recent due to cold) and shortness of breath (mild, recently - ? asthma or cold). Negative for wheezing.   Cardiovascular: Positive for chest pain and palpitations. Negative  for leg swelling.  Gastrointestinal: Positive for nausea (occasional). Negative for abdominal pain.       No gerd  Musculoskeletal: Positive for arthralgias (intermittent).  Neurological: Positive for light-headedness (with migraine only) and headaches (intermittent, no change).       Objective:   Vitals:   06/22/17 1053  BP: (!) 146/96  Pulse: (!) 101  Resp: 16  Temp: 98.1 F (36.7 C)  SpO2: 99%   Filed Weights   06/22/17 1053  Weight: 109 lb (49.4 kg)   Body mass index is 18.14 kg/m.    BP Readings from Last 3 Encounters:  06/22/17 (!) 146/96  12/30/16 110/70  12/27/16 (!) 126/96     Wt Readings from Last 3 Encounters:  06/22/17 109 lb (49.4 kg)  12/30/16 115 lb 1.3 oz (52.2 kg)  12/27/16 110 lb (49.9 kg)     Physical Exam Constitutional: Appears well-developed and well-nourished. No distress.  HENT:  Head: Normocephalic and atraumatic.  Neck: Neck supple. No tracheal deviation present. Left sided thyromegaly present.  No cervical lymphadenopathy Cardiovascular: Normal rate, regular rhythm and normal heart sounds.   No murmur heard. No carotid bruit .  No edema Pulmonary/Chest: No tenderness in central chest with palpation.  Effort normal and breath sounds normal. No respiratory distress. No has no wheezes. No rales.  Msk: no joint swelling or deformity Skin: Skin is warm and dry. Not diaphoretic.  Psychiatric: Normal mood and affect. Behavior is normal.         Assessment & Plan:   See Problem List for Assessment and Plan of chronic medical problems.

## 2017-06-22 NOTE — Telephone Encounter (Signed)
[facility] - Client [facility]  Patient Name: Jasmine Flores  DOB: 06-08-1986    Initial Comment Caller states wants an appt, has chest pressure since March.    Nurse Assessment  Nurse: Renie Ora, RN, Ashby Dawes Date/Time (Eastern Time): 06/22/2017 8:56:45 AM  Confirm and document reason for call. If symptomatic, describe symptoms. ---Patient has had chest pressure on and off since March. States this episode of chest pressure started yesterday. Rates pain right now a 4 out of 10.  Does the patient have any new or worsening symptoms? ---Yes  Will a triage be completed? ---Yes  Related visit to physician within the last 2 weeks? ---No  Does the PT have any chronic conditions? (i.e. diabetes, asthma, etc.) ---Yes  List chronic conditions. ---asthma, migraines  Is the patient pregnant or possibly pregnant? (Ask all females between the ages of 88-55) ---No  Is this a behavioral health or substance abuse call? ---No     Guidelines    Guideline Title Affirmed Question Affirmed Notes  Chest Pain [1] Intermittent chest pain or "angina" AND [2] increasing in severity or frequency (Exception: pains lasting a few seconds)    Final Disposition User   Go to ED Now Renie Ora, RN, Jeanine    Comments  Caller states that her chest pressure episodes typically last less than 5 minutes at a time, but they come and go. States they seem more frequently though. Advised per guideline for ED now outcome. Patient declines stating that she went to the ED back in March and was told nothing was wrong. Would prefer to see her doctor or go to Urgent Care instead. Explained per directive that I will make a note on the chart and have someone call her back to discuss. Verbalizes understanding.  Spoke with Almyra Free from the office and provided information per directive. States she will check with provider and call patient back to advise.   Referrals  GO TO FACILITY  UNDECIDED   Disagree/Comply: Disagree  Disagree/Comply Reason: Disagree with instructions

## 2017-06-22 NOTE — Assessment & Plan Note (Signed)
Left sided Will check tsh and Ultrasound

## 2017-06-22 NOTE — Assessment & Plan Note (Signed)
CTA in past neg for PE - did have pulm nodules and a follow up CT is scheduled for next week EKG and blood work previously normal Sinus tach today on exam Will recheck labs - cbc, cmp, tsh Will refer to cardiology for further evaluation

## 2017-06-22 NOTE — Assessment & Plan Note (Signed)
Occurs with chest pressure, not occurring daily and no pattern to when it occurs May need holter, but may only occur 1-2 times a week  - will refer to cardiology for further evaluation Check labs, cmp, cbc, tsh

## 2017-06-22 NOTE — Patient Instructions (Signed)
  Test(s) ordered today. Your results will be released to [place] (or called to you) after review, usually within 72hours after test completion. If any changes need to be made, you will be notified at that same time.   Medications reviewed and updated.  No changes recommended at this time.    A referral was ordered for cardiology.  A thyroid ultrasound was ordered - someone will call you to schedule this.

## 2017-06-22 NOTE — Assessment & Plan Note (Signed)
?  Autoimmune Intermittent, no joint swelling Will check ANA, esr

## 2017-06-23 ENCOUNTER — Encounter: Payer: Self-pay | Admitting: Internal Medicine

## 2017-06-23 LAB — ANA: Anti Nuclear Antibody(ANA): NEGATIVE

## 2017-06-29 ENCOUNTER — Ambulatory Visit (INDEPENDENT_AMBULATORY_CARE_PROVIDER_SITE_OTHER)
Admission: RE | Admit: 2017-06-29 | Discharge: 2017-06-29 | Disposition: A | Discharge status: Home / Self Care | Payer: 59 | Source: Ambulatory Visit | Attending: Internal Medicine | Admitting: Internal Medicine

## 2017-06-29 DIAGNOSIS — R911 Solitary pulmonary nodule: Secondary | ICD-10-CM | POA: Diagnosis not present

## 2017-06-29 DIAGNOSIS — IMO0001 Reserved for inherently not codable concepts without codable children: Secondary | ICD-10-CM

## 2017-06-29 DIAGNOSIS — R918 Other nonspecific abnormal finding of lung field: Secondary | ICD-10-CM | POA: Diagnosis not present

## 2017-07-02 ENCOUNTER — Other Ambulatory Visit: Payer: Self-pay | Admitting: Internal Medicine

## 2017-07-02 DIAGNOSIS — E010 Iodine-deficiency related diffuse (endemic) goiter: Secondary | ICD-10-CM

## 2017-07-04 ENCOUNTER — Encounter: Payer: Self-pay | Admitting: Internal Medicine

## 2017-07-20 ENCOUNTER — Other Ambulatory Visit: Payer: Self-pay

## 2017-07-24 ENCOUNTER — Ambulatory Visit
Admission: RE | Admit: 2017-07-24 | Discharge: 2017-07-24 | Disposition: A | Discharge status: Home / Self Care | Payer: 59 | Source: Ambulatory Visit | Attending: Internal Medicine | Admitting: Internal Medicine

## 2017-07-24 DIAGNOSIS — E049 Nontoxic goiter, unspecified: Secondary | ICD-10-CM | POA: Diagnosis not present

## 2017-07-24 DIAGNOSIS — E010 Iodine-deficiency related diffuse (endemic) goiter: Secondary | ICD-10-CM

## 2017-07-26 ENCOUNTER — Encounter: Payer: Self-pay | Admitting: Internal Medicine

## 2017-07-28 ENCOUNTER — Encounter: Payer: Self-pay | Admitting: Interventional Cardiology

## 2017-07-28 ENCOUNTER — Ambulatory Visit (INDEPENDENT_AMBULATORY_CARE_PROVIDER_SITE_OTHER): Payer: 59 | Admitting: Interventional Cardiology

## 2017-07-28 VITALS — BP 114/82 | HR 92 | Ht 65.0 in | Wt 114.8 lb

## 2017-07-28 DIAGNOSIS — R0789 Other chest pain: Secondary | ICD-10-CM | POA: Diagnosis not present

## 2017-07-28 DIAGNOSIS — J4520 Mild intermittent asthma, uncomplicated: Secondary | ICD-10-CM | POA: Diagnosis not present

## 2017-07-28 NOTE — Progress Notes (Signed)
Cardiology Office Note   Date:  07/28/2017   ID:  Jasmine, Flores [birth year], MRN [id number]  PCP:  Binnie Rail, MD    No chief complaint on file. chest pain   Wt Readings from Last 3 Encounters:  07/28/17 114 lb 12.8 oz (52.1 kg)  06/22/17 109 lb (49.4 kg)  12/30/16 115 lb 1.3 oz (52.2 kg)       History of Present Illness: Jasmine Flores is a 31 y.o. female who is being seen today for the evaluation of chest pain at the request of Burns, Stacy J, MD.  About 8 months ago, she developed some chest discomfort that felt like a pressure.  No particular triggers.  It would happen at rest.  No relation to exertion.  She went to the ER, and had a negative w/u.  CP gets better with deep breathing.  No change with coughing that she has noted.   Most intense sx were about 7 months ago.   No regular exercise.  She walks with her toddler.  No sx with that activity.  Now she has sx once a week.  No nausea, arm pain or sweating associated.  No lightheadedness or passing out.   She has chronic migraines as well.   In her family, her father died from a stroke and heart problems.  Mother has HTN.    She has asthma as well.        Past Medical History:  Diagnosis Date  . Asthma   . Headache   . Migraines   . Positive TB test     Past Surgical History:  Procedure Laterality Date  . 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 TONSILECTOMY, ADENOIDECTOMY, BILATERAL MYRINGOTOMY AND TUBES       Current Outpatient Prescriptions  Medication Sig Dispense Refill  . albuterol (PROVENTIL HFA;VENTOLIN HFA) 108 (90 Base) MCG/ACT inhaler Inhale 1 puff into the lungs every 6 (six) hours as needed for wheezing or shortness of breath. 1 Inhaler 11  . aspirin-acetaminophen-caffeine (EXCEDRIN MIGRAINE) 250-250-65 MG tablet Take 1 tablet by mouth every 6 (six) hours as needed for headache. 30 tablet 0  . Multiple Vitamin (MULTIVITAMIN WITH MINERALS) TABS tablet Take 1  tablet by mouth daily.     No current facility-administered medications for this visit.     Allergies:   Shellfish allergy    Social History:  The patient  reports that she has never smoked. She has never used smokeless tobacco. She reports that she does not drink alcohol or use drugs.   Family History:  The patient's family history includes Diabetes in her father, maternal grandfather, maternal grandmother, and mother; Hypertension in her father, maternal grandfather, maternal grandmother, and mother; Stroke in her father.    ROS:  Please see the history of present illness.   Otherwise, review of systems are positive for chest pain.   All other systems are reviewed and negative.    PHYSICAL EXAM: VS:  BP 114/82   Pulse 92   Ht 5\' 5"  (1.651 m)   Wt 114 lb 12.8 oz (52.1 kg)   SpO2 99%   BMI 19.10 kg/m  , BMI Body mass index is 19.1 kg/m. GEN: Well nourished, well developed, in no acute distress  HEENT: normal  Neck: no JVD, carotid bruits, or masses Cardiac: RRR; no murmurs, rubs, or gallops,no edema  Respiratory:  clear to auscultation bilaterally, normal work of breathing GI: soft, nontender, nondistended, + BS  MS: no deformity or atrophy  Skin: warm and dry, no rash Neuro:  Strength and sensation are intact Psych: euthymic mood, full affect   EKG:   The ekg ordered today demonstrates NSR, nonspecific ST segment changes   Recent Labs: 06/22/2017: ALT 10; BUN 14; Creatinine, Ser 0.67; Hemoglobin 10.5; Platelets 350.0; Potassium 4.0; Sodium 138; TSH 0.50   Lipid Panel    Component Value Date/Time   CHOL 166 09/11/2016 0845   TRIG 56.0 09/11/2016 0845   HDL 71.30 09/11/2016 0845   CHOLHDL 2 09/11/2016 0845   VLDL 11.2 09/11/2016 0845   LDLCALC 84 09/11/2016 0845     Other studies Reviewed: Additional studies/ records that were reviewed today with results demonstrating: negative troponin at prior ER visit.   ASSESSMENT AND PLAN:  1. Chest pain:  Will treat for  inflammation in the chest wall.  OK to use ibuprofen 400-600 mg TID prn chest pain.  Take with food.  If sx persist, will plan ETT.  Pain not related to exertion.  No changes on prior ECG concerning for ischemia and she had a negative troponin at that time.  She will contact us if sx get worse. 2. No other RF for CAD. 3. Asthma: she does not feel sx are related to asthma.    Current medicines are reviewed at length with the patient today.  The patient concerns regarding her medicines were addressed.  The following changes have been made:  No change  Labs/ tests ordered today include:  No orders of the defined types were placed in this encounter.   Recommend 150 minutes/week of aerobic exercise Low fat, low carb, high fiber diet recommended  Disposition:   FU in prn   Signed, Larae Grooms, MD  07/28/2017 2:09 PM    [facility] [address], [place], [place]  [address] Phone: [phone]; Fax: [phone]

## 2017-07-28 NOTE — Patient Instructions (Signed)
Medication Instructions:  Take Over The Counter Ibuprofen 400-600 mg three times a day with food AS NEEDED for pain   Labwork: None ordered  Testing/Procedures: None ordered  Follow-Up: Your physician recommends that you follow-up AS NEEDED    Any Other Special Instructions Will Be Listed Below (If Applicable).  Call us if your symptome worsen or fail to improve   If you need a refill on your cardiac medications before your next appointment, please call your pharmacy.

## 2017-08-29 DIAGNOSIS — H00036 Abscess of eyelid left eye, unspecified eyelid: Secondary | ICD-10-CM | POA: Diagnosis not present

## 2017-10-06 NOTE — L&D Delivery Note (Signed)
Delivery Note At  a viable female was delivered via  (Presentation:vtx ;LOA  ).  APGAR:8 ,9 ; weight  .   Placenta status:complete , .3V  Cord:  with the following complications:none .    Anesthesia:  Epidural Episiotomy:  none Lacerations:  Intact Suture Repair: N/A Est. Blood Loss (mL):    Mom to postpartum.  Baby to Couplet care / Skin to Skin.  Pleas Koch Prothero 06/30/2018, 1:31 AM

## 2017-12-01 DIAGNOSIS — Z113 Encounter for screening for infections with a predominantly sexual mode of transmission: Secondary | ICD-10-CM | POA: Diagnosis not present

## 2017-12-01 DIAGNOSIS — Z6820 Body mass index (BMI) 20.0-20.9, adult: Secondary | ICD-10-CM | POA: Diagnosis not present

## 2017-12-01 DIAGNOSIS — N925 Other specified irregular menstruation: Secondary | ICD-10-CM | POA: Diagnosis not present

## 2017-12-01 DIAGNOSIS — R5383 Other fatigue: Secondary | ICD-10-CM | POA: Diagnosis not present

## 2017-12-01 DIAGNOSIS — Z01419 Encounter for gynecological examination (general) (routine) without abnormal findings: Secondary | ICD-10-CM | POA: Diagnosis not present

## 2017-12-01 DIAGNOSIS — R110 Nausea: Secondary | ICD-10-CM | POA: Diagnosis not present

## 2017-12-15 DIAGNOSIS — Z331 Pregnant state, incidental: Secondary | ICD-10-CM | POA: Diagnosis not present

## 2017-12-15 DIAGNOSIS — M545 Low back pain: Secondary | ICD-10-CM | POA: Diagnosis not present

## 2017-12-15 DIAGNOSIS — R102 Pelvic and perineal pain: Secondary | ICD-10-CM | POA: Diagnosis not present

## 2017-12-16 ENCOUNTER — Other Ambulatory Visit (HOSPITAL_COMMUNITY): Payer: Self-pay | Admitting: Obstetrics and Gynecology

## 2017-12-16 DIAGNOSIS — Z369 Encounter for antenatal screening, unspecified: Secondary | ICD-10-CM | POA: Diagnosis not present

## 2017-12-16 DIAGNOSIS — Z331 Pregnant state, incidental: Secondary | ICD-10-CM | POA: Diagnosis not present

## 2017-12-16 DIAGNOSIS — N925 Other specified irregular menstruation: Secondary | ICD-10-CM | POA: Diagnosis not present

## 2017-12-16 DIAGNOSIS — O3680X9 Pregnancy with inconclusive fetal viability, other fetus: Secondary | ICD-10-CM

## 2017-12-16 LAB — OB RESULTS CONSOLE RUBELLA ANTIBODY, IGM: Rubella: IMMUNE

## 2017-12-16 LAB — OB RESULTS CONSOLE ABO/RH: RH TYPE: POSITIVE

## 2017-12-16 LAB — OB RESULTS CONSOLE HIV ANTIBODY (ROUTINE TESTING): HIV: NONREACTIVE

## 2017-12-16 LAB — OB RESULTS CONSOLE RPR: RPR: NONREACTIVE

## 2017-12-16 LAB — OB RESULTS CONSOLE GC/CHLAMYDIA
Chlamydia: NEGATIVE
Gonorrhea: NEGATIVE

## 2017-12-16 LAB — OB RESULTS CONSOLE HEPATITIS B SURFACE ANTIGEN: Hepatitis B Surface Ag: NEGATIVE

## 2017-12-16 LAB — OB RESULTS CONSOLE ANTIBODY SCREEN: Antibody Screen: NEGATIVE

## 2017-12-19 ENCOUNTER — Encounter: Payer: Self-pay | Admitting: Internal Medicine

## 2017-12-30 DIAGNOSIS — D649 Anemia, unspecified: Secondary | ICD-10-CM | POA: Diagnosis not present

## 2017-12-30 DIAGNOSIS — O219 Vomiting of pregnancy, unspecified: Secondary | ICD-10-CM | POA: Diagnosis not present

## 2017-12-30 DIAGNOSIS — Z3491 Encounter for supervision of normal pregnancy, unspecified, first trimester: Secondary | ICD-10-CM | POA: Diagnosis not present

## 2017-12-30 DIAGNOSIS — Z363 Encounter for antenatal screening for malformations: Secondary | ICD-10-CM | POA: Diagnosis not present

## 2017-12-30 DIAGNOSIS — Z3A11 11 weeks gestation of pregnancy: Secondary | ICD-10-CM | POA: Diagnosis not present

## 2017-12-31 ENCOUNTER — Other Ambulatory Visit (HOSPITAL_COMMUNITY): Payer: Self-pay | Admitting: Obstetrics and Gynecology

## 2017-12-31 DIAGNOSIS — Z369 Encounter for antenatal screening, unspecified: Secondary | ICD-10-CM

## 2018-01-05 ENCOUNTER — Ambulatory Visit (INDEPENDENT_AMBULATORY_CARE_PROVIDER_SITE_OTHER): Payer: Self-pay | Admitting: Emergency Medicine

## 2018-01-05 VITALS — BP 112/78 | HR 115 | Temp 97.7°F | Resp 20

## 2018-01-05 DIAGNOSIS — J101 Influenza due to other identified influenza virus with other respiratory manifestations: Secondary | ICD-10-CM

## 2018-01-05 DIAGNOSIS — R52 Pain, unspecified: Secondary | ICD-10-CM

## 2018-01-05 LAB — POCT INFLUENZA A/B
Influenza A, POC: POSITIVE — AB
Influenza B, POC: NEGATIVE

## 2018-01-05 MED ORDER — ALBUTEROL SULFATE HFA 108 (90 BASE) MCG/ACT IN AERS: 2.0000 | INHALATION_SPRAY | Freq: Four times a day (QID) | RESPIRATORY_TRACT | 0 refills | Status: DC | PRN

## 2018-01-05 MED ORDER — OSELTAMIVIR PHOSPHATE 75 MG PO CAPS
75.0000 mg | ORAL_CAPSULE | Freq: Two times a day (BID) | ORAL | 0 refills | Status: DC
Start: 2018-01-05 — End: 2018-01-11

## 2018-01-05 NOTE — Patient Instructions (Signed)

## 2018-01-05 NOTE — Progress Notes (Signed)
Subjective:     Jasmine Flores is a 32 y.o. female who presents for evaluation of influenza like symptoms. Symptoms include chills, headache, myalgias, productive cough and fever and have been present for 2 days. She has tried to alleviate the symptoms with acetaminophen with minimal relief. High risk factors for influenza complications: none. Additionally, patient is [redacted] weeks pregnant and followed by an OB, no known complications     Review of Systems Pertinent items are noted in HPI.     Objective:    BP 112/78 (BP Location: Right Arm, Patient Position: Standing, Cuff Size: Normal)   Pulse (!) 115   Temp 97.7 F (36.5 C) (Oral)   Resp 20   SpO2 100%  General appearance: alert, cooperative and appears stated age Head: Normocephalic, without obvious abnormality, atraumatic Ears: normal TM's and external ear canals both ears Nose: Nares normal. Septum midline. Mucosa normal. No drainage or sinus tenderness. Throat: lips, mucosa, and tongue normal; teeth and gums normal Neck: no adenopathy Lungs: clear to auscultation bilaterally Heart: regular rate and rhythm Skin: Skin color, texture, turgor normal. No rashes or lesions    Assessment:    Influenza    Plan:    Supportive care with appropriate antipyretics and fluids. Antivirals per orders. Follow up in 1 week or as needed.   Refilled albuterol inhaler

## 2018-01-11 ENCOUNTER — Encounter (HOSPITAL_COMMUNITY): Payer: Self-pay | Admitting: *Deleted

## 2018-01-11 ENCOUNTER — Other Ambulatory Visit: Payer: Self-pay

## 2018-01-11 ENCOUNTER — Inpatient Hospital Stay (HOSPITAL_COMMUNITY)
Admission: AD | Admit: 2018-01-11 | Discharge: 2018-01-11 | Disposition: A | Discharge status: Home / Self Care | Payer: 59 | Source: Ambulatory Visit | Attending: Obstetrics and Gynecology | Admitting: Obstetrics and Gynecology

## 2018-01-11 DIAGNOSIS — O219 Vomiting of pregnancy, unspecified: Secondary | ICD-10-CM

## 2018-01-11 DIAGNOSIS — O210 Mild hyperemesis gravidarum: Secondary | ICD-10-CM | POA: Insufficient documentation

## 2018-01-11 DIAGNOSIS — Z3A12 12 weeks gestation of pregnancy: Secondary | ICD-10-CM | POA: Insufficient documentation

## 2018-01-11 DIAGNOSIS — Z7982 Long term (current) use of aspirin: Secondary | ICD-10-CM | POA: Insufficient documentation

## 2018-01-11 HISTORY — DX: Herpesviral infection, unspecified: B00.9

## 2018-01-11 LAB — BASIC METABOLIC PANEL
ANION GAP: 10 (ref 5–15)
BUN: 11 mg/dL (ref 6–20)
CALCIUM: 8.8 mg/dL — AB (ref 8.9–10.3)
CO2: 22 mmol/L (ref 22–32)
Chloride: 102 mmol/L (ref 101–111)
Creatinine, Ser: 0.44 mg/dL (ref 0.44–1.00)
GFR calc Af Amer: >60 mL/min (ref >60)
GFR calc non Af Amer: >60 mL/min (ref >60)
GLUCOSE: 92 mg/dL (ref 65–99)
Potassium: 4.2 mmol/L (ref 3.5–5.1)
Sodium: 134 mmol/L — ABNORMAL LOW (ref 135–145)

## 2018-01-11 LAB — URINALYSIS, ROUTINE W REFLEX MICROSCOPIC
BILIRUBIN URINE: NEGATIVE
GLUCOSE, UA: NEGATIVE mg/dL
HGB URINE DIPSTICK: NEGATIVE
Ketones, ur: NEGATIVE mg/dL
Nitrite: NEGATIVE
PROTEIN: NEGATIVE mg/dL
Specific Gravity, Urine: 1.011 (ref 1.005–1.030)
pH: 6 (ref 5.0–8.0)

## 2018-01-11 MED ORDER — PROMETHAZINE HCL 25 MG/ML IJ SOLN
25.0000 mg | Freq: Once | INTRAMUSCULAR | Status: AC
  Administered 2018-01-11: 25 mg via INTRAVENOUS
  Filled 2018-01-11: qty 1

## 2018-01-11 MED ORDER — LACTATED RINGERS IV BOLUS
1000.0000 mL | Freq: Once | INTRAVENOUS | Status: AC
  Administered 2018-01-11: 1000 mL via INTRAVENOUS

## 2018-01-11 MED ORDER — FAMOTIDINE IN NACL 20-0.9 MG/50ML-% IV SOLN
20.0000 mg | Freq: Once | INTRAVENOUS | Status: AC
  Administered 2018-01-11: 20 mg via INTRAVENOUS
  Filled 2018-01-11: qty 50

## 2018-01-11 MED ORDER — PROMETHAZINE HCL 25 MG PO TABS: 25.0000 mg | ORAL_TABLET | Freq: Four times a day (QID) | ORAL | 0 refills | Status: DC | PRN

## 2018-01-11 NOTE — Discharge Instructions (Signed)
Morning Sickness °Morning sickness is when you feel sick to your stomach (nauseous) during pregnancy. This nauseous feeling may or may not come with vomiting. It often occurs in the morning but can be a problem any time of day. Morning sickness is most common during the first trimester, but it may continue throughout pregnancy. While morning sickness is unpleasant, it is usually harmless unless you develop severe and continual vomiting (hyperemesis gravidarum). This condition requires more intense treatment. °What are the causes? °The cause of morning sickness is not completely known but seems to be related to normal hormonal changes that occur in pregnancy. °What increases the risk? °You are at greater risk if you: °· Experienced nausea or vomiting before your pregnancy. °· Had morning sickness during a previous pregnancy. °· Are pregnant with more than one baby, such as twins. ° °How is this treated? °Do not use any medicines (prescription, over-the-counter, or herbal) for morning sickness without first talking to your health care provider. Your health care provider may prescribe or recommend: °· Vitamin B6 supplements. °· Anti-nausea medicines. °· The herbal medicine ginger. ° °Follow these instructions at home: °· Only take over-the-counter or prescription medicines as directed by your health care provider. °· Taking multivitamins before getting pregnant can prevent or decrease the severity of morning sickness in most women. °· Eat a piece of dry toast or unsalted crackers before getting out of bed in the morning. °· Eat five or six small meals a day. °· Eat dry and bland foods (rice, baked potato). Foods high in carbohydrates are often helpful. °· Do not drink liquids with your meals. Drink liquids between meals. °· Avoid greasy, fatty, and spicy foods. °· Get someone to cook for you if the smell of any food causes nausea and vomiting. °· If you feel nauseous after taking prenatal vitamins, take the vitamins at  night or with a snack. °· Snack on protein foods (nuts, yogurt, cheese) between meals if you are hungry. °· Eat unsweetened gelatins for desserts. °· Wearing an acupressure wristband (worn for sea sickness) may be helpful. °· Acupuncture may be helpful. °· Do not smoke. °· Get a humidifier to keep the air in your house free of odors. °· Get plenty of fresh air. °Contact a health care provider if: °· Your home remedies are not working, and you need medicine. °· You feel dizzy or lightheaded. °· You are losing weight. °Get help right away if: °· You have persistent and uncontrolled nausea and vomiting. °· You pass out (faint). °This information is not intended to replace advice given to you by your health care provider. Make sure you discuss any questions you have with your health care provider. °Document Released: 11/13/2006 Document Revised: 02/28/2016 Document Reviewed: 03/09/2013 °Elsevier Interactive Patient Education © 2017 [employer] ° °

## 2018-01-11 NOTE — MAU Provider Note (Signed)
History     CSN: [id number]  Arrival date and time: 01/11/18 0005   First Provider Initiated Contact with Patient 01/11/18 0116      Chief Complaint  Patient presents with  . Nausea  . Emesis   HPI Jasmine Flores is a 32 y.o. G2P1001 at [redacted]w[redacted]d who presents with nausea & vomiting. Reports symptoms have continued throughout pregnancy but have worsened today. Reports vomiting 5+ times in the last 12 hours. Took zofran at 9 pm tonight but has not helped symptoms. States "nothing works". Zofran is only antiemetic she has at home. Was previously using bonjesta, pressure point bands, tea, and ginger candy without relief of nausea. Normally has constant nausea and vomits at least once per day. Denies abdominal pain, vaginal bleeding, fever/chills, or diarrhea.  OB History    Gravida  2   Para  1   Term  1   Preterm  0   AB  0   Living  1     SAB  0   TAB  0   Ectopic  0   Multiple  0   Live Births  1           Past Medical History:  Diagnosis Date  . Asthma   . Headache   . HSV infection   . Migraines   . Positive TB test     Past Surgical History:  Procedure Laterality Date  . 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 TONSILECTOMY, ADENOIDECTOMY, BILATERAL MYRINGOTOMY AND TUBES      Family History  Problem Relation Age of Onset  . Diabetes Mother   . Hypertension Mother   . Diabetes Father   . Hypertension Father   . Stroke Father   . Hypertension Maternal Grandmother   . Diabetes Maternal Grandmother   . Hypertension Maternal Grandfather   . Diabetes Maternal Grandfather     Social History   Tobacco Use  . Smoking status: Never Smoker  . Smokeless tobacco: Never Used  Substance Use Topics  . Alcohol use: No  . Drug use: No    Allergies:  Allergies  Allergen Reactions  . Shellfish Allergy Itching and Swelling    Medications Prior to Admission  Medication Sig Dispense Refill Last Dose  . oseltamivir (TAMIFLU) 75 MG capsule Take  1 capsule (75 mg total) by mouth 2 (two) times daily. 10 capsule 0 01/10/2018 at Unknown time  . albuterol (PROVENTIL HFA;VENTOLIN HFA) 108 (90 Base) MCG/ACT inhaler Inhale 2 puffs into the lungs every 6 (six) hours as needed for wheezing or shortness of breath. 1 Inhaler 0   . aspirin-acetaminophen-caffeine (EXCEDRIN MIGRAINE) 250-250-65 MG tablet Take 1 tablet by mouth every 6 (six) hours as needed for headache. 30 tablet 0 Taking  . Multiple Vitamin (MULTIVITAMIN WITH MINERALS) TABS tablet Take 1 tablet by mouth daily.   Taking    Review of Systems  Constitutional: Negative.   Gastrointestinal: Positive for nausea and vomiting. Negative for abdominal pain, constipation and diarrhea.  Genitourinary: Negative.    Physical Exam   Blood pressure 107/82, pulse 77, temperature 98.6 F (37 C), temperature source Oral, resp. rate 17, weight 111 lb (50.3 kg), SpO2 100 %, unknown if currently breastfeeding.  Physical Exam  Nursing note and vitals reviewed. Constitutional: She is oriented to person, place, and time. She appears well-developed and well-nourished. No distress.  HENT:  Head: Normocephalic and atraumatic.  Eyes: Conjunctivae are normal. Right eye exhibits no discharge. Left eye  exhibits no discharge. No scleral icterus.  Neck: Normal range of motion.  Cardiovascular: Normal rate, regular rhythm and normal heart sounds.  No murmur heard. Respiratory: Effort normal and breath sounds normal. No respiratory distress. She has no wheezes.  GI: Soft. Bowel sounds are normal. There is no tenderness.  Neurological: She is alert and oriented to person, place, and time.  Skin: Skin is warm and dry. She is not diaphoretic.  Psychiatric: She has a normal mood and affect. Her behavior is normal. Judgment and thought content normal.    MAU Course  Procedures Results for orders placed or performed during the hospital encounter of 01/11/18 (from the past 24 hour(s))  Urinalysis, Routine w reflex  microscopic     Status: Abnormal   Collection Time: 01/11/18 12:40 AM  Result Value Ref Range   Color, Urine YELLOW YELLOW   APPearance CLEAR CLEAR   Specific Gravity, Urine 1.011 1.005 - 1.030   pH 6.0 5.0 - 8.0   Glucose, UA NEGATIVE NEGATIVE mg/dL   Hgb urine dipstick NEGATIVE NEGATIVE   Bilirubin Urine NEGATIVE NEGATIVE   Ketones, ur NEGATIVE NEGATIVE mg/dL   Protein, ur NEGATIVE NEGATIVE mg/dL   Nitrite NEGATIVE NEGATIVE   Leukocytes, UA SMALL (A) NEGATIVE   RBC / HPF 0-5 0 - 5 RBC/hpf   WBC, UA 6-30 0 - 5 WBC/hpf   Bacteria, UA RARE (A) NONE SEEN   Squamous Epithelial / LPF 0-5 (A) NONE SEEN   Mucus PRESENT   Basic metabolic panel     Status: Abnormal   Collection Time: 01/11/18  1:41 AM  Result Value Ref Range   Sodium 134 (L) 135 - 145 mmol/L   Potassium 4.2 3.5 - 5.1 mmol/L   Chloride 102 101 - 111 mmol/L   CO2 22 22 - 32 mmol/L   Glucose, Bld 92 65 - 99 mg/dL   BUN 11 6 - 20 mg/dL   Creatinine, Ser 0.44 0.44 - 1.00 mg/dL   Calcium 8.8 (L) 8.9 - 10.3 mg/dL   GFR calc non Af Amer >60 >60 mL/min   GFR calc Af Amer >60 >60 mL/min   Anion gap 10 5 - 15    MDM FHT 167 by doppler IV LR bolus, phenergan 25 mg  No episodes of vomiting in MAU Intake BP mildly elevated, repeat WNL Will d/c home with rx phenergan to alternate with zofran   Assessment and Plan  A: 1. Nausea and vomiting during pregnancy prior to [redacted] weeks gestation   2. [redacted] weeks gestation of pregnancy    P: Discharge home rx phenergan Discussed reasons to return to MAU Keep f/u with OB or call prn  Jorje Guild 01/11/2018, 1:16 AM

## 2018-01-11 NOTE — MAU Note (Signed)
Pt reports n/v for the last 24 hours. Pt has been nauseated this pregnancy and usually throws up about once a day, but she has not been able to keep anything down for the past day. Took 9pm with no relief. No diarrhea  Pt was treated with Tamiflu, last dose was yesterday.

## 2018-01-15 ENCOUNTER — Telehealth: Payer: Self-pay

## 2018-01-15 NOTE — Telephone Encounter (Signed)
Called and spoke with pt regarding how she was feeling and she states she is feeling better.

## 2018-01-20 ENCOUNTER — Encounter (HOSPITAL_COMMUNITY): Payer: Self-pay

## 2018-01-21 ENCOUNTER — Other Ambulatory Visit (HOSPITAL_COMMUNITY): Payer: Self-pay | Admitting: Obstetrics and Gynecology

## 2018-01-21 ENCOUNTER — Ambulatory Visit (HOSPITAL_COMMUNITY)
Admission: RE | Admit: 2018-01-21 | Discharge: 2018-01-21 | Disposition: A | Discharge status: Home / Self Care | Payer: 59 | Source: Ambulatory Visit | Attending: Internal Medicine | Admitting: Internal Medicine

## 2018-01-21 ENCOUNTER — Encounter: Payer: Self-pay | Admitting: Internal Medicine

## 2018-01-21 ENCOUNTER — Encounter (HOSPITAL_COMMUNITY): Payer: Self-pay

## 2018-01-21 ENCOUNTER — Ambulatory Visit: Payer: 59 | Admitting: Internal Medicine

## 2018-01-21 ENCOUNTER — Ambulatory Visit (HOSPITAL_COMMUNITY)
Admission: RE | Admit: 2018-01-21 | Discharge: 2018-01-21 | Disposition: A | Discharge status: Home / Self Care | Payer: 59 | Source: Ambulatory Visit | Attending: Obstetrics and Gynecology | Admitting: Obstetrics and Gynecology

## 2018-01-21 DIAGNOSIS — R05 Cough: Secondary | ICD-10-CM

## 2018-01-21 DIAGNOSIS — O99011 Anemia complicating pregnancy, first trimester: Secondary | ICD-10-CM | POA: Diagnosis not present

## 2018-01-21 DIAGNOSIS — Z369 Encounter for antenatal screening, unspecified: Secondary | ICD-10-CM

## 2018-01-21 DIAGNOSIS — Z3A13 13 weeks gestation of pregnancy: Secondary | ICD-10-CM | POA: Diagnosis not present

## 2018-01-21 DIAGNOSIS — O09299 Supervision of pregnancy with other poor reproductive or obstetric history, unspecified trimester: Secondary | ICD-10-CM | POA: Diagnosis not present

## 2018-01-21 DIAGNOSIS — Z3682 Encounter for antenatal screening for nuchal translucency: Secondary | ICD-10-CM

## 2018-01-21 DIAGNOSIS — R059 Cough, unspecified: Secondary | ICD-10-CM

## 2018-01-21 MED ORDER — FLUTICASONE PROPIONATE 50 MCG/ACT NA SUSP: 2.0000 | Freq: Every day | NASAL | 6 refills | Status: DC

## 2018-01-21 NOTE — Progress Notes (Signed)
   Subjective:    Patient ID: Jasmine Flores, female    DOB: Apr 10, 1986, 32 y.o.   MRN: [id number]  HPI The patient is a 32 YO female coming in for follow up. Diagnosed with flu about 2-3 weeks ago. Since that time is still coughing. Is pregnant [redacted] weeks along. Still nausea and vomiting which is unchanged recently. She denies fevers or chills. Denies much nose congestion or drainage. Coughing and mild sputum production. Some SOB and using albuterol inhaler twice per day with good effectiveness.   Review of Systems  Constitutional: Negative for activity change, appetite change, chills, fatigue, fever and unexpected weight change.  HENT: Positive for congestion and postnasal drip. Negative for ear discharge, ear pain, rhinorrhea, sinus pressure, sinus pain, sneezing, sore throat, tinnitus, trouble swallowing and voice change.   Eyes: Negative.   Respiratory: Positive for cough and shortness of breath. Negative for chest tightness and wheezing.   Cardiovascular: Negative.   Gastrointestinal: Positive for nausea and vomiting.  Neurological: Negative.       Objective:   Physical Exam  Constitutional: She is oriented to person, place, and time. She appears well-developed and well-nourished.  HENT:  Head: Normocephalic and atraumatic.  Oropharynx with redness, nose with swollen turbinates, TMs normal bilaterally  Eyes: EOM are normal.  Neck: Normal range of motion. No thyromegaly present.  Cardiovascular: Normal rate and regular rhythm.  Pulmonary/Chest: Effort normal and breath sounds normal. No respiratory distress. She has no wheezes. She has no rales.  Abdominal: Soft.  Lymphadenopathy:    She has no cervical adenopathy.  Neurological: She is alert and oriented to person, place, and time.  Skin: Skin is warm and dry.   Vitals:   01/21/18 1326  BP: 106/70  Pulse: (!) 112  Temp: 98.2 F (36.8 C)  TempSrc: Oral  SpO2: 99%  Weight: 116 lb (52.6 kg)  Height: 5\' 5"  (1.651 m)        Assessment & Plan:

## 2018-01-21 NOTE — Assessment & Plan Note (Signed)
Suspect from post nasal drip. Can use albuterol prn. Rx for flonase.

## 2018-01-21 NOTE — Patient Instructions (Signed)
We have sent in the flonase to use, 2 sprays in each nostril daily as needed.

## 2018-01-22 ENCOUNTER — Other Ambulatory Visit: Payer: Self-pay

## 2018-02-11 ENCOUNTER — Other Ambulatory Visit: Payer: Self-pay | Admitting: Student

## 2018-02-23 DIAGNOSIS — Z369 Encounter for antenatal screening, unspecified: Secondary | ICD-10-CM | POA: Diagnosis not present

## 2018-03-18 DIAGNOSIS — Z3A21 21 weeks gestation of pregnancy: Secondary | ICD-10-CM | POA: Diagnosis not present

## 2018-03-18 DIAGNOSIS — Z363 Encounter for antenatal screening for malformations: Secondary | ICD-10-CM | POA: Diagnosis not present

## 2018-05-04 DIAGNOSIS — Z369 Encounter for antenatal screening, unspecified: Secondary | ICD-10-CM | POA: Diagnosis not present

## 2018-05-13 DIAGNOSIS — Z3A29 29 weeks gestation of pregnancy: Secondary | ICD-10-CM | POA: Diagnosis not present

## 2018-05-13 DIAGNOSIS — Z8759 Personal history of other complications of pregnancy, childbirth and the puerperium: Secondary | ICD-10-CM | POA: Diagnosis not present

## 2018-05-26 DIAGNOSIS — O99810 Abnormal glucose complicating pregnancy: Secondary | ICD-10-CM | POA: Diagnosis not present

## 2018-06-08 ENCOUNTER — Other Ambulatory Visit: Payer: Self-pay | Admitting: Student

## 2018-06-10 DIAGNOSIS — Z3A33 33 weeks gestation of pregnancy: Secondary | ICD-10-CM | POA: Diagnosis not present

## 2018-06-10 DIAGNOSIS — R51 Headache: Secondary | ICD-10-CM | POA: Diagnosis not present

## 2018-06-10 DIAGNOSIS — Z3492 Encounter for supervision of normal pregnancy, unspecified, second trimester: Secondary | ICD-10-CM | POA: Diagnosis not present

## 2018-06-10 DIAGNOSIS — Z87898 Personal history of other specified conditions: Secondary | ICD-10-CM | POA: Diagnosis not present

## 2018-06-10 DIAGNOSIS — M25559 Pain in unspecified hip: Secondary | ICD-10-CM | POA: Diagnosis not present

## 2018-06-10 DIAGNOSIS — R030 Elevated blood-pressure reading, without diagnosis of hypertension: Secondary | ICD-10-CM | POA: Diagnosis not present

## 2018-06-15 DIAGNOSIS — O139 Gestational [pregnancy-induced] hypertension without significant proteinuria, unspecified trimester: Secondary | ICD-10-CM | POA: Diagnosis not present

## 2018-06-15 DIAGNOSIS — Z3483 Encounter for supervision of other normal pregnancy, third trimester: Secondary | ICD-10-CM | POA: Diagnosis not present

## 2018-06-15 DIAGNOSIS — Z3482 Encounter for supervision of other normal pregnancy, second trimester: Secondary | ICD-10-CM | POA: Diagnosis not present

## 2018-06-16 ENCOUNTER — Other Ambulatory Visit: Payer: Self-pay

## 2018-06-16 ENCOUNTER — Inpatient Hospital Stay (HOSPITAL_COMMUNITY)
Admission: AD | Admit: 2018-06-16 | Discharge: 2018-06-16 | Disposition: A | Discharge status: Home / Self Care | Payer: 59 | Source: Ambulatory Visit | Attending: Obstetrics & Gynecology | Admitting: Obstetrics & Gynecology

## 2018-06-16 ENCOUNTER — Encounter (HOSPITAL_COMMUNITY): Payer: Self-pay

## 2018-06-16 DIAGNOSIS — Z8249 Family history of ischemic heart disease and other diseases of the circulatory system: Secondary | ICD-10-CM | POA: Diagnosis not present

## 2018-06-16 DIAGNOSIS — O99353 Diseases of the nervous system complicating pregnancy, third trimester: Secondary | ICD-10-CM | POA: Diagnosis not present

## 2018-06-16 DIAGNOSIS — G43009 Migraine without aura, not intractable, without status migrainosus: Secondary | ICD-10-CM | POA: Insufficient documentation

## 2018-06-16 DIAGNOSIS — Z3A34 34 weeks gestation of pregnancy: Secondary | ICD-10-CM | POA: Insufficient documentation

## 2018-06-16 DIAGNOSIS — O139 Gestational [pregnancy-induced] hypertension without significant proteinuria, unspecified trimester: Secondary | ICD-10-CM | POA: Diagnosis not present

## 2018-06-16 DIAGNOSIS — O133 Gestational [pregnancy-induced] hypertension without significant proteinuria, third trimester: Secondary | ICD-10-CM | POA: Insufficient documentation

## 2018-06-16 DIAGNOSIS — J45909 Unspecified asthma, uncomplicated: Secondary | ICD-10-CM | POA: Diagnosis not present

## 2018-06-16 DIAGNOSIS — O163 Unspecified maternal hypertension, third trimester: Secondary | ICD-10-CM | POA: Diagnosis present

## 2018-06-16 DIAGNOSIS — G43001 Migraine without aura, not intractable, with status migrainosus: Secondary | ICD-10-CM

## 2018-06-16 DIAGNOSIS — O99513 Diseases of the respiratory system complicating pregnancy, third trimester: Secondary | ICD-10-CM | POA: Insufficient documentation

## 2018-06-16 DIAGNOSIS — G43909 Migraine, unspecified, not intractable, without status migrainosus: Secondary | ICD-10-CM | POA: Diagnosis not present

## 2018-06-16 LAB — COMPREHENSIVE METABOLIC PANEL
ALT: 14 U/L (ref 0–44)
ANION GAP: 11 (ref 5–15)
AST: 22 U/L (ref 15–41)
Albumin: 3.4 g/dL — ABNORMAL LOW (ref 3.5–5.0)
Alkaline Phosphatase: 166 U/L — ABNORMAL HIGH (ref 38–126)
BILIRUBIN TOTAL: 0.3 mg/dL (ref 0.3–1.2)
BUN: 11 mg/dL (ref 6–20)
CO2: 21 mmol/L — ABNORMAL LOW (ref 22–32)
Calcium: 9.2 mg/dL (ref 8.9–10.3)
Chloride: 103 mmol/L (ref 98–111)
Creatinine, Ser: 0.43 mg/dL — ABNORMAL LOW (ref 0.44–1.00)
Glucose, Bld: 85 mg/dL (ref 70–99)
POTASSIUM: 3.8 mmol/L (ref 3.5–5.1)
Sodium: 135 mmol/L (ref 135–145)
TOTAL PROTEIN: 6.9 g/dL (ref 6.5–8.1)

## 2018-06-16 LAB — URIC ACID: Uric Acid, Serum: 6 mg/dL (ref 2.5–7.1)

## 2018-06-16 LAB — URINALYSIS, ROUTINE W REFLEX MICROSCOPIC
Bilirubin Urine: NEGATIVE
Glucose, UA: NEGATIVE mg/dL
Hgb urine dipstick: NEGATIVE
KETONES UR: 15 mg/dL — AB
NITRITE: NEGATIVE
PROTEIN: NEGATIVE mg/dL
Specific Gravity, Urine: <1.005 — ABNORMAL LOW (ref 1.005–1.030)
pH: 6 (ref 5.0–8.0)

## 2018-06-16 LAB — PROTEIN / CREATININE RATIO, URINE: CREATININE, URINE: 24 mg/dL

## 2018-06-16 LAB — CBC
HEMATOCRIT: 37.9 % (ref 36.0–46.0)
HEMOGLOBIN: 12.6 g/dL (ref 12.0–15.0)
MCH: 26.3 pg (ref 26.0–34.0)
MCHC: 33.2 g/dL (ref 30.0–36.0)
MCV: 79.1 fL (ref 78.0–100.0)
Platelets: 221 10*3/uL (ref 150–400)
RBC: 4.79 MIL/uL (ref 3.87–5.11)
RDW: 14.7 % (ref 11.5–15.5)
WBC: 6.8 10*3/uL (ref 4.0–10.5)

## 2018-06-16 LAB — URINALYSIS, MICROSCOPIC (REFLEX)
BACTERIA UA: NONE SEEN
RBC / HPF: NONE SEEN RBC/hpf (ref 0–5)

## 2018-06-16 NOTE — Discharge Instructions (Signed)
Hypertension During Pregnancy Hypertension is also called high blood pressure. High blood pressure means that the force of your blood moving in your body is too strong. When you are pregnant, this condition should be watched carefully. It can cause problems for you and your baby. Follow these instructions at home: Eating and drinking  Drink enough fluid to keep your pee (urine) clear or pale yellow.  Eat healthy foods that are low in salt (sodium). ? Do not add salt to your food. ? Check labels on foods and drinks to see much salt is in them. Look on the label where you see "Sodium." Lifestyle  Do not use any products that contain nicotine or tobacco, such as cigarettes and e-cigarettes. If you need help quitting, ask your doctor.  Do not use alcohol.  Avoid caffeine.  Avoid stress. Rest and get plenty of sleep. General instructions  Take over-the-counter and prescription medicines only as told by your doctor.  While lying down, lie on your left side. This keeps pressure off your baby.  While sitting or lying down, raise (elevate) your feet. Try putting some pillows under your lower legs.  Exercise regularly. Ask your doctor what kinds of exercise are best for you.  Keep all prenatal and follow-up visits as told by your doctor. This is important. Contact a doctor if:  You have symptoms that your doctor told you to watch for, such as: ? Fever. ? Throwing up (vomiting). ? Headache. Get help right away if:  You have very bad pain in your belly (abdomen).  You are throwing up, and this does not get better with treatment.  You suddenly get swelling in your hands, ankles, or face.  You gain 4 lb (1.8 kg) or more in 1 week.  You get bleeding from your vagina.  You have blood in your pee.  You do not feel your baby moving as much as normal.  You have a change in vision.  You have muscle twitching or sudden tightening (spasms).  You have trouble breathing.  Your lips  or fingernails turn blue. This information is not intended to replace advice given to you by your health care provider. Make sure you discuss any questions you have with your health care provider. Document Released: 10/25/2010 Document Revised: 06/03/2016 Document Reviewed: 06/03/2016 Elsevier Interactive Patient Education  2018 [employer].   How to Take Your Blood Pressure Blood pressure is a measurement of how strongly your blood is pressing against the walls of your arteries. Arteries are blood vessels that carry blood from your heart throughout your body. Your health care provider takes your blood pressure at each office visit. You can also take your own blood pressure at home with a blood pressure machine. You may need to take your own blood pressure:  To confirm a diagnosis of high blood pressure (hypertension).  To monitor your blood pressure over time.  To make sure your blood pressure medicine is working.  Supplies needed: To take your blood pressure, you will need a blood pressure machine. You can buy a blood pressure machine, or blood pressure monitor, at most drugstores or online. There are several types of home blood pressure monitors. When choosing one, consider the following:  Choose a monitor that has an arm cuff.  Choose a monitor that wraps snugly around your upper arm. You should be able to fit only one finger between your arm and the cuff.  Do not choose a monitor that measures your blood pressure from your  wrist or finger.  Your health care provider can suggest a reliable monitor that will meet your needs. How to prepare To get the most accurate reading, avoid the following for 30 minutes before you check your blood pressure:  Drinking caffeine.  Drinking alcohol.  Eating.  Smoking.  Exercising.  Five minutes before you check your blood pressure:  Empty your bladder.  Sit quietly without talking in a dining chair, rather than in a soft couch or  armchair.  How to take your blood pressure To check your blood pressure, follow the instructions in the manual that came with your blood pressure monitor. If you have a digital blood pressure monitor, the instructions may be as follows: 1. Sit up straight. 2. Place your feet on the floor. Do not cross your ankles or legs. 3. Rest your left arm at the level of your heart on a table or desk or on the arm of a chair. 4. Pull up your shirt sleeve. 5. Wrap the blood pressure cuff around the upper part of your left arm, 1 inch (2.5 cm) above your elbow. It is best to wrap the cuff around bare skin. 6. Fit the cuff snugly around your arm. You should be able to place only one finger between the cuff and your arm. 7. Position the cord inside the groove of your elbow. 8. Press the power button. 9. Sit quietly while the cuff inflates and deflates. 10. Read the digital reading on the monitor screen and write it down (record it). 11. Wait 2-3 minutes, then repeat the steps, starting at step 1.  What does my blood pressure reading mean? A blood pressure reading consists of a higher number over a lower number. Ideally, your blood pressure should be below 120/80. The first ("top") number is called the systolic pressure. It is a measure of the pressure in your arteries as your heart beats. The second ("bottom") number is called the diastolic pressure. It is a measure of the pressure in your arteries as the heart relaxes. Blood pressure is classified into four stages. The following are the stages for adults who do not have a short-term serious illness or a chronic condition. Systolic pressure and diastolic pressure are measured in a unit called mm Hg. Normal  Systolic pressure: below 834.  Diastolic pressure: below 80. Elevated  Systolic pressure: 196-222.  Diastolic pressure: below 80. Hypertension stage 1  Systolic pressure: 979-892.  Diastolic pressure: 11-94. Hypertension stage 2  Systolic  pressure: 174 or above.  Diastolic pressure: 90 or above. You can have prehypertension or hypertension even if only the systolic or only the diastolic number in your reading is higher than normal. Follow these instructions at home:  Check your blood pressure as often as recommended by your health care provider.  Take your monitor to the next appointment with your health care provider to make sure: ? That you are using it correctly. ? That it provides accurate readings.  Be sure you understand what your goal blood pressure numbers are.  Tell your health care provider if you are having any side effects from blood pressure medicine. Contact a health care provider if:  Your blood pressure is consistently high. Get help right away if:  Your systolic blood pressure is higher than 180.  Your diastolic blood pressure is higher than 110. This information is not intended to replace advice given to you by your health care provider. Make sure you discuss any questions you have with your health care provider.  Document Released: 02/29/2016 Document Revised: 05/13/2016 Document Reviewed: 02/29/2016 Elsevier Interactive Patient Education  2018 [employer].   Hypertension During Pregnancy Hypertension, commonly called high blood pressure, is when the force of blood pumping through your arteries is too strong. Arteries are blood vessels that carry blood from the heart throughout the body. Hypertension during pregnancy can cause problems for you and your baby. Your baby may be born early (prematurely) or may not weigh as much as he or she should at birth. Very bad cases of hypertension during pregnancy can be life-threatening. Different types of hypertension can occur during pregnancy. These include:  Chronic hypertension. This happens when: ? You have hypertension before pregnancy and it continues during pregnancy. ? You develop hypertension before you are [redacted] weeks pregnant, and it continues during  pregnancy.  Gestational hypertension. This is hypertension that develops after the 20th week of pregnancy.  Preeclampsia, also called toxemia of pregnancy. This is a very serious type of hypertension that develops only during pregnancy. It affects the whole body, and it can be very dangerous for you and your baby.  Gestational hypertension and preeclampsia usually go away within 6 weeks after your baby is born. Women who have hypertension during pregnancy have a greater chance of developing hypertension later in life or during future pregnancies. What are the causes? The exact cause of hypertension is not known. What increases the risk? There are certain factors that make it more likely for you to develop hypertension during pregnancy. These include:  Having hypertension during a previous pregnancy or prior to pregnancy.  Being overweight.  Being older than age 29.  Being pregnant for the first time or being pregnant with more than one baby.  Becoming pregnant using fertilization methods such as IVF (in vitro fertilization).  Having diabetes, kidney problems, or systemic lupus erythematosus.  Having a family history of hypertension.  What are the signs or symptoms? Chronic hypertension and gestational hypertension rarely cause symptoms. Preeclampsia causes symptoms, which may include:  Increased protein in your urine. Your health care provider will check for this at every visit before you give birth (prenatal visit).  Severe headaches.  Sudden weight gain.  Swelling of the hands, face, legs, and feet.  Nausea and vomiting.  Vision problems, such as blurred or double vision.  Numbness in the face, arms, legs, and feet.  Dizziness.  Slurred speech.  Sensitivity to bright lights.  Abdominal pain.  Convulsions.  How is this diagnosed? You may be diagnosed with hypertension during a routine prenatal exam. At each prenatal visit, you may:  Have a urine test to check  for high amounts of protein in your urine.  Have your blood pressure checked. A blood pressure reading is recorded as two numbers, such as "120 over 80" (or 120/80). The first ("top") number is called the systolic pressure. It is a measure of the pressure in your arteries when your heart beats. The second ("bottom") number is called the diastolic pressure. It is a measure of the pressure in your arteries as your heart relaxes between beats. Blood pressure is measured in a unit called mm Hg. A normal blood pressure reading is: ? Systolic: below 810. ? Diastolic: below 80.  The type of hypertension that you are diagnosed with depends on your test results and when your symptoms developed.  Chronic hypertension is usually diagnosed before 20 weeks of pregnancy.  Gestational hypertension is usually diagnosed after 20 weeks of pregnancy.  Hypertension with high amounts of protein in  the urine is diagnosed as preeclampsia.  Blood pressure measurements that stay above 976 systolic, or above 734 diastolic, are signs of severe preeclampsia.  How is this treated? Treatment for hypertension during pregnancy varies depending on the type of hypertension you have and how serious it is.  If you take medicines called ACE inhibitors to treat chronic hypertension, you may need to switch medicines. ACE inhibitors should not be taken during pregnancy.  If you have gestational hypertension, you may need to take blood pressure medicine.  If you are at risk for preeclampsia, your health care provider may recommend that you take a low-dose aspirin every day to prevent high blood pressure during your pregnancy.  If you have severe preeclampsia, you may need to be hospitalized so you and your baby can be monitored closely. You may also need to take medicine (magnesium sulfate) to prevent seizures and to lower blood pressure. This medicine may be given as an injection or through an IV tube.  In some cases, if your  condition gets worse, you may need to deliver your baby early.  Follow these instructions at home: Eating and drinking  Drink enough fluid to keep your urine clear or pale yellow.  Eat a healthy diet that is low in salt (sodium). Do not add salt to your food. Check food labels to see how much sodium a food or beverage contains. Lifestyle  Do not use any products that contain nicotine or tobacco, such as cigarettes and e-cigarettes. If you need help quitting, ask your health care provider.  Do not use alcohol.  Avoid caffeine.  Avoid stress as much as possible. Rest and get plenty of sleep. General instructions  Take over-the-counter and prescription medicines only as told by your health care provider.  While lying down, lie on your left side. This keeps pressure off your baby.  While sitting or lying down, raise (elevate) your feet. Try putting some pillows under your lower legs.  Exercise regularly. Ask your health care provider what kinds of exercise are best for you.  Keep all prenatal and follow-up visits as told by your health care provider. This is important. Contact a health care provider if:  You have symptoms that your health care provider told you may require more treatment or monitoring, such as: ? Fever. ? Vomiting. ? Headache. Get help right away if:  You have severe abdominal pain or vomiting that does not get better with treatment.  You suddenly develop swelling in your hands, ankles, or face.  You gain 4 lbs (1.8 kg) or more in 1 week.  You develop vaginal bleeding, or you have blood in your urine.  You do not feel your baby moving as much as usual.  You have blurred or double vision.  You have muscle twitching or sudden tightening (spasms).  You have shortness of breath.  Your lips or fingernails turn blue. This information is not intended to replace advice given to you by your health care provider. Make sure you discuss any questions you have  with your health care provider. Document Released: 06/10/2011 Document Revised: 04/11/2016 Document Reviewed: 03/07/2016 Elsevier Interactive Patient Education  2018 [employer].   Migraine Headache A migraine headache is a very strong throbbing pain on one side or both sides of your head. Migraines can also cause other symptoms. Talk with your doctor about what things may bring on (trigger) your migraine headaches. Follow these instructions at home: Medicines  Take over-the-counter and prescription medicines only as told  by your doctor.  Do not drive or use heavy machinery while taking prescription pain medicine.  To prevent or treat constipation while you are taking prescription pain medicine, your doctor may recommend that you: ? Drink enough fluid to keep your pee (urine) clear or pale yellow. ? Take over-the-counter or prescription medicines. ? Eat foods that are high in fiber. These include fresh fruits and vegetables, whole grains, and beans. ? Limit foods that are high in fat and processed sugars. These include fried and sweet foods. Lifestyle  Avoid alcohol.  Do not use any products that contain nicotine or tobacco, such as cigarettes and e-cigarettes. If you need help quitting, ask your doctor.  Get at least 8 hours of sleep every night.  Limit your stress. General instructions   Keep a journal to find out what may bring on your migraines. For example, write down: ? What you eat and drink. ? How much sleep you get. ? Any change in what you eat or drink. ? Any change in your medicines.  If you have a migraine: ? Avoid things that make your symptoms worse, such as bright lights. ? It may help to lie down in a dark, quiet room. ? Do not drive or use heavy machinery. ? Ask your doctor what activities are safe for you.  Keep all follow-up visits as told by your doctor. This is important. Contact a doctor if:  You get a migraine that is different or worse than your  usual migraines. Get help right away if:  Your migraine gets very bad.  You have a fever.  You have a stiff neck.  You have trouble seeing.  Your muscles feel weak or like you cannot control them.  You start to lose your balance a lot.  You start to have trouble walking.  You pass out (faint). This information is not intended to replace advice given to you by your health care provider. Make sure you discuss any questions you have with your health care provider. Document Released: 07/01/2008 Document Revised: 04/11/2016 Document Reviewed: 03/10/2016 Elsevier Interactive Patient Education  2018 [employer].

## 2018-06-16 NOTE — MAU Provider Note (Addendum)
Chief Complaint:  Headache and Hypertension   None    HPI: Jasmine Flores is a 32 y.o. G2P1001 at [redacted]w[redacted]d who presents to maternity admissions reporting Pt came from home, pt was getting ready to go to dance class then felt cloudy in her head, HA in right side. Pt endorse started labetalol 100mg  BID, pt took first dose today. Pt denies N/V/D. Pt endorses having migraines and take fioricet , took one today with no improvement. Denies contractions, leakage of fluid or vaginal bleeding. Good fetal movement.   Pregnancy Course:   Past Medical History:  Diagnosis Date  . Asthma   . Headache   . HSV infection   . Migraines   . Positive TB test    OB History  Gravida Para Term Preterm AB Living  2 1 1  0 0 1  SAB TAB Ectopic Multiple Live Births  0 0 0 0 1    # Outcome Date GA Lbr Len/2nd Weight Sex Delivery Anes PTL Lv  2 Current           1 Term 09/03/15 [redacted]w[redacted]d 01:28 / 00:16 1800 g F Vag-Spont EPI  LIV   Past Surgical History:  Procedure Laterality Date  . 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 TONSILECTOMY, ADENOIDECTOMY, BILATERAL MYRINGOTOMY AND TUBES    . WISDOM TOOTH EXTRACTION     Family History  Problem Relation Age of Onset  . Diabetes Mother   . Hypertension Mother   . Diabetes Father   . Hypertension Father   . Stroke Father   . Hypertension Maternal Grandmother   . Diabetes Maternal Grandmother   . Hypertension Maternal Grandfather   . Diabetes Maternal Grandfather    Social History   Tobacco Use  . Smoking status: Never Smoker  . Smokeless tobacco: Never Used  Substance Use Topics  . Alcohol use: No  . Drug use: No   Allergies  Allergen Reactions  . Shellfish Allergy Itching and Swelling   No medications prior to admission.    I have reviewed patient's Past Medical Hx, Surgical Hx, Family Hx, Social Hx, medications and allergies.   ROS:  Review of Systems  All other systems reviewed and are negative.   Physical Exam   Patient Vitals  for the past 24 hrs:  BP Temp Temp src Pulse Resp SpO2 Weight  06/16/18 2046 (!) 127/93 98.1 F (36.7 C) Oral 87 16 - -  06/16/18 2030 (!) 129/96 - - (!) 103 - 99 % -  06/16/18 2016 116/87 - - 97 - - -  06/16/18 2001 124/89 - - 85 - - -  06/16/18 1945 (!) 134/100 - - (!) 103 - 100 % -  06/16/18 1931 (!) 135/99 - - 90 - - -  06/16/18 1916 (!) 134/99 - - 92 - - -  06/16/18 1902 (!) 125/97 - - 98 - - -  06/16/18 1818 (!) 146/94 98 F (36.7 C) Oral 88 18 100 % 61.8 kg   Constitutional: Well-developed, well-nourished female in no acute distress.  Cardiovascular: normal rate Respiratory: normal effort GI: Abd soft, non-tender, gravid appropriate for gestational age. Pos BS x 4 MS: Extremities nontender, no edema, normal ROM Neurologic: Alert and oriented x 4.  GU: Neg CVAT.  NST: FHR baseline 130 bpm, Variability: moderate, Accelerations:present, Decelerations:  Absent= Cat 1/Reactive UC:   none  Labs: Results for orders placed or performed during the hospital encounter of 06/16/18 (from the past 24 hour(s))  Urinalysis,  Routine w reflex microscopic     Status: Abnormal   Collection Time: 06/16/18  6:27 PM  Result Value Ref Range   Color, Urine STRAW (A) YELLOW   APPearance CLEAR CLEAR   Specific Gravity, Urine <1.005 (L) 1.005 - 1.030   pH 6.0 5.0 - 8.0   Glucose, UA NEGATIVE NEGATIVE mg/dL   Hgb urine dipstick NEGATIVE NEGATIVE   Bilirubin Urine NEGATIVE NEGATIVE   Ketones, ur 15 (A) NEGATIVE mg/dL   Protein, ur NEGATIVE NEGATIVE mg/dL   Nitrite NEGATIVE NEGATIVE   Leukocytes, UA SMALL (A) NEGATIVE  Urinalysis, Microscopic (reflex)     Status: None   Collection Time: 06/16/18  6:27 PM  Result Value Ref Range   RBC / HPF NONE SEEN 0 - 5 RBC/hpf   WBC, UA 0-5 0 - 5 WBC/hpf   Bacteria, UA NONE SEEN NONE SEEN   Squamous Epithelial / LPF 0-5 0 - 5  Protein / creatinine ratio, urine     Status: None   Collection Time: 06/16/18  6:27 PM  Result Value Ref Range   Creatinine,  Urine 24.00 mg/dL   Total Protein, Urine <6 mg/dL   Protein Creatinine Ratio        0.00 - 0.15 mg/mg[Cre]  Comprehensive metabolic panel     Status: Abnormal   Collection Time: 06/16/18  7:59 PM  Result Value Ref Range   Sodium 135 135 - 145 mmol/L   Potassium 3.8 3.5 - 5.1 mmol/L   Chloride 103 98 - 111 mmol/L   CO2 21 (L) 22 - 32 mmol/L   Glucose, Bld 85 70 - 99 mg/dL   BUN 11 6 - 20 mg/dL   Creatinine, Ser 0.43 (L) 0.44 - 1.00 mg/dL   Calcium 9.2 8.9 - 10.3 mg/dL   Total Protein 6.9 6.5 - 8.1 g/dL   Albumin 3.4 (L) 3.5 - 5.0 g/dL   AST 22 15 - 41 U/L   ALT 14 0 - 44 U/L   Alkaline Phosphatase 166 (H) 38 - 126 U/L   Total Bilirubin 0.3 0.3 - 1.2 mg/dL   GFR calc non Af Amer >60 >60 mL/min   GFR calc Af Amer >60 >60 mL/min   Anion gap 11 5 - 15  CBC     Status: None   Collection Time: 06/16/18  7:59 PM  Result Value Ref Range   WBC 6.8 4.0 - 10.5 K/uL   RBC 4.79 3.87 - 5.11 MIL/uL   Hemoglobin 12.6 12.0 - 15.0 g/dL   HCT 37.9 36.0 - 46.0 %   MCV 79.1 78.0 - 100.0 fL   MCH 26.3 26.0 - 34.0 pg   MCHC 33.2 30.0 - 36.0 g/dL   RDW 14.7 11.5 - 15.5 %   Platelets 221 150 - 400 K/uL  Uric acid     Status: None   Collection Time: 06/16/18  7:59 PM  Result Value Ref Range   Uric Acid, Serum 6.0 2.5 - 7.1 mg/dL    Imaging:  No results found.  MAU Course: Orders Placed This Encounter  Procedures  . Urinalysis, Routine w reflex microscopic  . Urinalysis, Microscopic (reflex)  . Comprehensive metabolic panel  . CBC  . Uric acid  . Protein / creatinine ratio, urine  . Diet - low sodium heart healthy  . Increase activity slowly  . Call MD for:  . Call MD for:  temperature >100.4  . Call MD for:  persistant nausea and vomiting  . Call MD for:  severe uncontrolled pain  . Call MD for:  redness, tenderness, or signs of infection (pain, swelling, redness, odor or green/yellow discharge around incision site)  . Call MD for:  difficulty breathing, headache or visual  disturbances  . Call MD for:  hives  . Call MD for:  persistant dizziness or light-headedness  . Call MD for:  extreme fatigue  . (HEART FAILURE PATIENTS) Call MD:  Anytime you have any of the following symptoms: 1) 3 pound weight gain in 24 hours or 5 pounds in 1 week 2) shortness of breath, with or without a dry hacking cough 3) swelling in the hands, feet or stomach 4) if you have to sleep on extra pillows at night in order to breathe.   No orders of the defined types were placed in this encounter.   Assessment: 1. Gestational hypertension, third trimester   2. Migraine without aura and with status migrainosus, not intractable   Pt stable headache is minimal, no other s/sx, Pre E labs negative, pcr 0.18, BP stable at 127/93. NST reactive, cat 1.   Plan: Discharge home in stable condition.  Labor precautions and fetal kick counts GHTN: Continue labetalol 100mg  BID  Follow-up [facility] Follow up in 2 day(s).   Specialty:  Obstetrics and Gynecology Why:  Repeat BP check  Contact information: [address] [place] [id number] [phone]          Allergies as of 06/16/2018      Reactions   Shellfish Allergy Itching, Swelling      Medication List    TAKE these medications   albuterol 108 (90 Base) MCG/ACT inhaler Commonly known as:  PROVENTIL HFA;VENTOLIN HFA Inhale 2 puffs into the lungs every 6 (six) hours as needed for wheezing or shortness of breath.   fluticasone 50 MCG/ACT nasal spray Commonly known as:  FLONASE Place 2 sprays into both nostrils daily.   IRON PO Take by mouth.   multivitamin with minerals Tabs tablet Take 1 tablet by mouth daily.   PRENATAL VITAMIN PO Take by mouth.   promethazine 25 MG tablet Commonly known as:  PHENERGAN TAKE 1 TABLET(25 MG) BY MOUTH EVERY 6 HOURS AS NEEDED FOR NAUSEA OR VOMITING   TYLENOL PO Take by mouth.   ZOFRAN PO Take by  mouth.       [facility] NP-C, [place], [place], [place] 06/17/2018 12:27 AM

## 2018-06-16 NOTE — MAU Note (Signed)
Started having high blood pressure last wk, f/u yesterday- was elevated, started on Labetalol.  Doesn't feel right, has a HA, "cloudy feeling". No visual changes, denies epigastric pain or increase in swelling. Took Fioricet, no relief.

## 2018-06-18 DIAGNOSIS — B009 Herpesviral infection, unspecified: Secondary | ICD-10-CM | POA: Diagnosis not present

## 2018-06-18 DIAGNOSIS — I10 Essential (primary) hypertension: Secondary | ICD-10-CM | POA: Diagnosis not present

## 2018-06-18 DIAGNOSIS — O139 Gestational [pregnancy-induced] hypertension without significant proteinuria, unspecified trimester: Secondary | ICD-10-CM | POA: Diagnosis not present

## 2018-06-18 DIAGNOSIS — Z3A34 34 weeks gestation of pregnancy: Secondary | ICD-10-CM | POA: Diagnosis not present

## 2018-06-18 DIAGNOSIS — O133 Gestational [pregnancy-induced] hypertension without significant proteinuria, third trimester: Secondary | ICD-10-CM | POA: Diagnosis not present

## 2018-06-22 ENCOUNTER — Telehealth (HOSPITAL_COMMUNITY): Payer: Self-pay | Admitting: *Deleted

## 2018-06-22 DIAGNOSIS — O139 Gestational [pregnancy-induced] hypertension without significant proteinuria, unspecified trimester: Secondary | ICD-10-CM | POA: Diagnosis not present

## 2018-06-22 NOTE — Telephone Encounter (Signed)
Preadmission screen  

## 2018-06-23 ENCOUNTER — Encounter (HOSPITAL_COMMUNITY): Payer: Self-pay | Admitting: *Deleted

## 2018-06-23 NOTE — Telephone Encounter (Signed)
Preadmission screen  

## 2018-06-25 ENCOUNTER — Other Ambulatory Visit: Payer: Self-pay | Admitting: Obstetrics and Gynecology

## 2018-06-25 DIAGNOSIS — O139 Gestational [pregnancy-induced] hypertension without significant proteinuria, unspecified trimester: Secondary | ICD-10-CM | POA: Diagnosis not present

## 2018-06-25 DIAGNOSIS — Z3A16 16 weeks gestation of pregnancy: Secondary | ICD-10-CM | POA: Diagnosis not present

## 2018-06-25 DIAGNOSIS — Z23 Encounter for immunization: Secondary | ICD-10-CM | POA: Diagnosis not present

## 2018-06-25 DIAGNOSIS — Z3A35 35 weeks gestation of pregnancy: Secondary | ICD-10-CM | POA: Diagnosis not present

## 2018-06-25 LAB — OB RESULTS CONSOLE GBS: GBS: NEGATIVE

## 2018-06-29 ENCOUNTER — Encounter (HOSPITAL_COMMUNITY): Payer: Self-pay

## 2018-06-29 ENCOUNTER — Inpatient Hospital Stay (HOSPITAL_COMMUNITY)
Admission: AD | Admit: 2018-06-29 | Discharge: 2018-07-02 | DRG: 806 | Disposition: A | Discharge status: Home / Self Care | Payer: 59 | Attending: Obstetrics & Gynecology | Admitting: Obstetrics & Gynecology

## 2018-06-29 ENCOUNTER — Inpatient Hospital Stay (HOSPITAL_COMMUNITY): Payer: 59 | Admitting: Anesthesiology

## 2018-06-29 ENCOUNTER — Other Ambulatory Visit: Payer: Self-pay

## 2018-06-29 DIAGNOSIS — Z8759 Personal history of other complications of pregnancy, childbirth and the puerperium: Secondary | ICD-10-CM

## 2018-06-29 DIAGNOSIS — Z3A36 36 weeks gestation of pregnancy: Secondary | ICD-10-CM | POA: Diagnosis not present

## 2018-06-29 DIAGNOSIS — A6000 Herpesviral infection of urogenital system, unspecified: Secondary | ICD-10-CM | POA: Diagnosis present

## 2018-06-29 DIAGNOSIS — O139 Gestational [pregnancy-induced] hypertension without significant proteinuria, unspecified trimester: Secondary | ICD-10-CM | POA: Diagnosis not present

## 2018-06-29 DIAGNOSIS — Z23 Encounter for immunization: Secondary | ICD-10-CM

## 2018-06-29 DIAGNOSIS — Z3A00 Weeks of gestation of pregnancy not specified: Secondary | ICD-10-CM | POA: Diagnosis not present

## 2018-06-29 DIAGNOSIS — O9832 Other infections with a predominantly sexual mode of transmission complicating childbirth: Secondary | ICD-10-CM | POA: Diagnosis present

## 2018-06-29 DIAGNOSIS — O134 Gestational [pregnancy-induced] hypertension without significant proteinuria, complicating childbirth: Secondary | ICD-10-CM | POA: Diagnosis present

## 2018-06-29 LAB — COMPREHENSIVE METABOLIC PANEL
ALBUMIN: 3.3 g/dL — AB (ref 3.5–5.0)
ALT: 21 U/L (ref 0–44)
AST: 28 U/L (ref 15–41)
Alkaline Phosphatase: 204 U/L — ABNORMAL HIGH (ref 38–126)
Anion gap: 11 (ref 5–15)
BUN: 11 mg/dL (ref 6–20)
CHLORIDE: 102 mmol/L (ref 98–111)
CO2: 23 mmol/L (ref 22–32)
CREATININE: 0.61 mg/dL (ref 0.44–1.00)
Calcium: 8.9 mg/dL (ref 8.9–10.3)
GFR calc Af Amer: >60 mL/min (ref >60)
GFR calc non Af Amer: >60 mL/min (ref >60)
Glucose, Bld: 104 mg/dL — ABNORMAL HIGH (ref 70–99)
POTASSIUM: 3.8 mmol/L (ref 3.5–5.1)
SODIUM: 136 mmol/L (ref 135–145)
Total Bilirubin: 0.8 mg/dL (ref 0.3–1.2)
Total Protein: 7.1 g/dL (ref 6.5–8.1)

## 2018-06-29 LAB — CBC WITH DIFFERENTIAL/PLATELET
BASOS ABS: 0 10*3/uL (ref 0.0–0.1)
BASOS PCT: 0 %
EOS ABS: 0.1 10*3/uL (ref 0.0–0.7)
Eosinophils Relative: 2 %
HCT: 39.3 % (ref 36.0–46.0)
Hemoglobin: 13.3 g/dL (ref 12.0–15.0)
LYMPHS PCT: 24 %
Lymphs Abs: 1.7 10*3/uL (ref 0.7–4.0)
MCH: 26.8 pg (ref 26.0–34.0)
MCHC: 33.8 g/dL (ref 30.0–36.0)
MCV: 79.1 fL (ref 78.0–100.0)
Monocytes Absolute: 0.3 10*3/uL (ref 0.1–1.0)
Monocytes Relative: 4 %
Neutro Abs: 4.9 10*3/uL (ref 1.7–7.7)
Neutrophils Relative %: 70 %
PLATELETS: 225 10*3/uL (ref 150–400)
RBC: 4.97 MIL/uL (ref 3.87–5.11)
RDW: 14.6 % (ref 11.5–15.5)
WBC: 7.1 10*3/uL (ref 4.0–10.5)

## 2018-06-29 LAB — PROTEIN / CREATININE RATIO, URINE
Creatinine, Urine: 37 mg/dL
Total Protein, Urine: <6 mg/dL

## 2018-06-29 LAB — TYPE AND SCREEN
ABO/RH(D): B POS
ANTIBODY SCREEN: NEGATIVE

## 2018-06-29 LAB — URIC ACID: URIC ACID, SERUM: 6 mg/dL (ref 2.5–7.1)

## 2018-06-29 MED ORDER — LIDOCAINE HCL (PF) 1 % IJ SOLN
INTRAMUSCULAR | Status: DC | PRN
  Administered 2018-06-29: 6 mL via EPIDURAL

## 2018-06-29 MED ORDER — PHENYLEPHRINE 40 MCG/ML (10ML) SYRINGE FOR IV PUSH (FOR BLOOD PRESSURE SUPPORT)
80.0000 ug | PREFILLED_SYRINGE | INTRAVENOUS | Status: DC | PRN
  Filled 2018-06-29: qty 5

## 2018-06-29 MED ORDER — SOD CITRATE-CITRIC ACID 500-334 MG/5ML PO SOLN
30.0000 mL | ORAL | Status: DC | PRN
  Administered 2018-06-29: 30 mL via ORAL
  Filled 2018-06-29: qty 15

## 2018-06-29 MED ORDER — OXYCODONE-ACETAMINOPHEN 5-325 MG PO TABS: 2.0000 | ORAL_TABLET | ORAL | Status: DC | PRN

## 2018-06-29 MED ORDER — FENTANYL 2.5 MCG/ML BUPIVACAINE 1/10 % EPIDURAL INFUSION (WH - ANES)
INTRAMUSCULAR | Status: AC
  Filled 2018-06-29: qty 100

## 2018-06-29 MED ORDER — PHENYLEPHRINE 40 MCG/ML (10ML) SYRINGE FOR IV PUSH (FOR BLOOD PRESSURE SUPPORT): 80.0000 ug | PREFILLED_SYRINGE | INTRAVENOUS | Status: DC | PRN

## 2018-06-29 MED ORDER — ONDANSETRON HCL 4 MG/2ML IJ SOLN
4.0000 mg | Freq: Four times a day (QID) | INTRAMUSCULAR | Status: DC | PRN
  Administered 2018-06-29: 4 mg via INTRAVENOUS
  Filled 2018-06-29: qty 2

## 2018-06-29 MED ORDER — FENTANYL 2.5 MCG/ML BUPIVACAINE 1/10 % EPIDURAL INFUSION (WH - ANES)
14.0000 mL/h | INTRAMUSCULAR | Status: DC | PRN
  Administered 2018-06-29: 14 mL/h via EPIDURAL

## 2018-06-29 MED ORDER — OXYTOCIN 40 UNITS IN LACTATED RINGERS INFUSION - SIMPLE MED
2.5000 [IU]/h | INTRAVENOUS | Status: DC
  Administered 2018-06-30: 2.5 [IU]/h via INTRAVENOUS
  Filled 2018-06-29: qty 1000

## 2018-06-29 MED ORDER — EPHEDRINE 5 MG/ML INJ
10.0000 mg | INTRAVENOUS | Status: DC | PRN
  Filled 2018-06-29: qty 2

## 2018-06-29 MED ORDER — LIDOCAINE HCL (PF) 1 % IJ SOLN
30.0000 mL | INTRAMUSCULAR | Status: DC | PRN
  Filled 2018-06-29: qty 30

## 2018-06-29 MED ORDER — OXYCODONE-ACETAMINOPHEN 5-325 MG PO TABS: 1.0000 | ORAL_TABLET | ORAL | Status: DC | PRN

## 2018-06-29 MED ORDER — EPHEDRINE 5 MG/ML INJ: 10.0000 mg | INTRAVENOUS | Status: DC | PRN

## 2018-06-29 MED ORDER — PHENYLEPHRINE 40 MCG/ML (10ML) SYRINGE FOR IV PUSH (FOR BLOOD PRESSURE SUPPORT)
PREFILLED_SYRINGE | INTRAVENOUS | Status: AC
  Filled 2018-06-29: qty 10

## 2018-06-29 MED ORDER — FENTANYL CITRATE (PF) 100 MCG/2ML IJ SOLN
50.0000 ug | INTRAMUSCULAR | Status: DC | PRN
  Administered 2018-06-29: 50 ug via INTRAVENOUS

## 2018-06-29 MED ORDER — LACTATED RINGERS IV SOLN
500.0000 mL | Freq: Once | INTRAVENOUS | Status: AC
  Administered 2018-06-29: 500 mL via INTRAVENOUS

## 2018-06-29 MED ORDER — DIPHENHYDRAMINE HCL 50 MG/ML IJ SOLN: 12.5000 mg | INTRAMUSCULAR | Status: DC | PRN

## 2018-06-29 MED ORDER — OXYTOCIN BOLUS FROM INFUSION
500.0000 mL | Freq: Once | INTRAVENOUS | Status: AC
  Administered 2018-06-30: 500 mL via INTRAVENOUS

## 2018-06-29 MED ORDER — LACTATED RINGERS IV SOLN: 500.0000 mL | Freq: Once | INTRAVENOUS | Status: DC

## 2018-06-29 MED ORDER — FENTANYL CITRATE (PF) 100 MCG/2ML IJ SOLN
INTRAMUSCULAR | Status: AC
  Filled 2018-06-29: qty 2

## 2018-06-29 MED ORDER — LACTATED RINGERS IV SOLN
INTRAVENOUS | Status: DC
  Administered 2018-06-29: 20:00:00 via INTRAVENOUS

## 2018-06-29 MED ORDER — ACETAMINOPHEN 325 MG PO TABS: 650.0000 mg | ORAL_TABLET | ORAL | Status: DC | PRN

## 2018-06-29 MED ORDER — LACTATED RINGERS IV SOLN: 500.0000 mL | INTRAVENOUS | Status: DC | PRN

## 2018-06-29 NOTE — Anesthesia Preprocedure Evaluation (Signed)
Anesthesia Evaluation  Patient identified by MRN, date of birth, ID band Patient awake    Reviewed: Allergy & Precautions, NPO status , Patient's Chart, lab work & pertinent test results  Airway Mallampati: II  TM Distance: >3 FB Neck ROM: Full    Dental no notable dental hx. (+) Teeth Intact   Pulmonary asthma ,    Pulmonary exam normal breath sounds clear to auscultation       Cardiovascular hypertension, Normal cardiovascular exam Rhythm:Regular Rate:Normal     Neuro/Psych  Headaches,    GI/Hepatic   Endo/Other    Renal/GU      Musculoskeletal   Abdominal   Peds  Hematology  (+) anemia ,   Anesthesia Other Findings   Reproductive/Obstetrics                             Lab Results  Component Value Date   WBC 7.1 06/29/2018   HGB 13.3 06/29/2018   HCT 39.3 06/29/2018   MCV 79.1 06/29/2018   PLT 225 06/29/2018    Anesthesia Physical Anesthesia Plan  ASA: III  Anesthesia Plan: Epidural   Post-op Pain Management:    Induction:   PONV Risk Score and Plan:   Airway Management Planned:   Additional Equipment:   Intra-op Plan:   Post-operative Plan:   Informed Consent:   Plan Discussed with:   Anesthesia Plan Comments: (PT w PIH and anemia)        Anesthesia Quick Evaluation

## 2018-06-29 NOTE — Progress Notes (Signed)
Subjective: Pt has epidural.  Helping with pain  Objective: BP (!) 137/95   Pulse 82   Temp 98 F (36.7 C) (Oral)   Resp 16   Ht 5\' 5"  (1.651 m)   Wt 61.2 kg   LMP 10/17/2017   SpO2 100%   BMI 22.47 kg/m  No intake/output data recorded. No intake/output data recorded.  FHT: Category 1 FHT 120 accel, no decel UC:   regular, every 3-4 minutes SVE:   Dilation: 4.5 Station: -3 Exam by:: Dr Alwyn Pea  Assessment:  17 year G2P1 at 36 weeks 3 days in active labor, gestational hypertension Cat 1 strip  Plan: Monitor bleeding Anticipate SVE  Starla Link CNM, MSN 06/29/2018, 8:29 PM

## 2018-06-29 NOTE — Plan of Care (Signed)
Pt educated on labor and delivery process. Frequent repositioning to facilitate physiologic labor. CNM aware of bleeding.  Will continue to monitor and communicate with team as needed.

## 2018-06-29 NOTE — MAU Note (Signed)
Having contractions all day.  They were far apart and mild during the NST today. Around 1745, saw blood when she wiped.  Contractions have gotten stronger and closer.

## 2018-06-29 NOTE — Anesthesia Procedure Notes (Signed)
Epidural Patient location during procedure: OB Start time: 06/29/2018 7:38 PM End time: 06/29/2018 7:49 PM  Staffing Anesthesiologist: Barnet Glasgow, MD Performed: anesthesiologist   Preanesthetic Checklist Completed: patient identified, site marked, surgical consent, pre-op evaluation, timeout performed, IV checked, risks and benefits discussed and monitors and equipment checked  Epidural Patient position: sitting Prep: site prepped and draped and DuraPrep Patient monitoring: continuous pulse ox and blood pressure Approach: midline Location: L3-L4 Injection technique: LOR air  Needle:  Needle type: Tuohy  Needle gauge: 17 G Needle length: 9 cm and 9 Needle insertion depth: 6 cm Catheter type: closed end flexible Catheter size: 19 Gauge Catheter at skin depth: 11 cm Test dose: negative  Assessment Events: blood not aspirated, injection not painful, no injection resistance, negative IV test and no paresthesia  Additional Notes 1 attempt. Patient tolerated procedure well.

## 2018-06-29 NOTE — Progress Notes (Signed)
Subjective: Pt comfortable with epidural  Objective: BP (!) 137/91   Pulse (!) 115   Temp 98 F (36.7 C) (Oral)   Resp 20   Ht 5\' 5"  (1.651 m)   Wt 61.2 kg   LMP 10/17/2017   SpO2 98%   BMI 22.47 kg/m  No intake/output data recorded. No intake/output data recorded.  FHT: Category 1 FHT 120 accels, no decels UC:   regular, every 3 minutes SVE:   Dilation: 8 Effacement (%): 100 Station: 0 Exam by:: Irene Shipper CNM AROM bloody fluid.  Trickle of blood noted from vagina  Assessment:  G2P1001 at 36.3 IUP in active labor with possible abruption Cat 1 strip  Plan: Monitor FHT Anticipate SVD  Starla Link CNM, MSN 06/29/2018, 10:34 PM

## 2018-06-29 NOTE — MAU Note (Signed)
Urine in lab 

## 2018-06-29 NOTE — H&P (Signed)
Jasmine Flores is a 32 y.o. female G2P1001 at 36 weeks 3 days presenting for painful regular contractions and bloody show.  She denies leaking of fluid, and reports normal fetal movement.  She denies headache, blurry vision, no scotomata, no RUQ pain.   Her prenatal care complicated by: Gestational hypertension - was scheduled for induction of labor at 37 weeks Anemia Migraines HSV 2 Alpha thalassemia trait  OB History    Gravida  2   Para  1   Term  1   Preterm  0   AB  0   Living  1     SAB  0   TAB  0   Ectopic  0   Multiple  0   Live Births  1          Past Medical History:  Diagnosis Date  . Alpha trait thalassemia   . Anemia   . Asthma   . Headache   . HSV infection   . Migraines   . Positive TB test   . Pregnancy induced hypertension   . Vaginal Pap smear, abnormal    Past Surgical History:  Procedure Laterality Date  . 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 TONSILECTOMY, ADENOIDECTOMY, BILATERAL MYRINGOTOMY AND TUBES    . TONSILLECTOMY     and adenoids  . WISDOM TOOTH EXTRACTION     Family History: family history includes Diabetes in her father, maternal grandfather, maternal grandmother, and mother; Hypertension in her father, maternal grandfather, maternal grandmother, and mother; Stroke in her father. Social History:  reports that she has never smoked. She has never used smokeless tobacco. She reports that she does not drink alcohol or use drugs.     Maternal Diabetes: No Genetic Screening: Normal Maternal Ultrasounds/Referrals: Normal Fetal Ultrasounds or other Referrals:  None, Other:  Normal growth 29% Maternal Substance Abuse:  No Significant Maternal Medications:  Meds include: Other: iron, PNV, valtrex Significant Maternal Lab Results:  Lab values include: Group B Strep negative Other Comments:  None  ROS History Dilation: 4.5 Station: -3 Exam by:: Dr Alwyn Pea  + Bloody show  Blood pressure (!) 126/97, pulse 96,  temperature 97.9 F (36.6 C), temperature source Oral, resp. rate 18, weight 61.2 kg, last menstrual period 10/17/2017, SpO2 97 %, unknown if currently breastfeeding. Exam Physical Exam  Prenatal labs: ABO, Rh: B/Positive/-- (03/13 0000) Antibody: n (03/13 0000) Rubella: Immune (03/13 0000) RPR: Nonreactive (03/13 0000)  HBsAg: Negative (03/13 0000)  HIV: Non-reactive (03/13 0000)  GBS:   NEG  Assessment/Plan: 32 year G2P1 at 36 weeks 3 days in active labor, gestational hypertension Bright red blood seen on exam, no hemorrhage but possible abruptio Admit to Labor and Delivery Continuous monitoring PIH labs IV hydration Epidural on demand   Camren Lipsett, [place] 06/29/2018, 7:04 PM

## 2018-06-30 ENCOUNTER — Encounter (HOSPITAL_COMMUNITY): Payer: Self-pay

## 2018-06-30 LAB — CBC
HEMATOCRIT: 34.6 % — AB (ref 36.0–46.0)
Hemoglobin: 11.6 g/dL — ABNORMAL LOW (ref 12.0–15.0)
MCH: 26.4 pg (ref 26.0–34.0)
MCHC: 33.5 g/dL (ref 30.0–36.0)
MCV: 78.8 fL (ref 78.0–100.0)
Platelets: 199 10*3/uL (ref 150–400)
RBC: 4.39 MIL/uL (ref 3.87–5.11)
RDW: 14.4 % (ref 11.5–15.5)
WBC: 9.2 10*3/uL (ref 4.0–10.5)

## 2018-06-30 LAB — PROTEIN / CREATININE RATIO, URINE
Creatinine, Urine: 86 mg/dL
Protein Creatinine Ratio: 0.12 mg/mg{Cre} (ref 0.00–0.15)
Total Protein, Urine: 10 mg/dL

## 2018-06-30 MED ORDER — ONDANSETRON HCL 4 MG/2ML IJ SOLN: 4.0000 mg | INTRAMUSCULAR | Status: DC | PRN

## 2018-06-30 MED ORDER — ONDANSETRON HCL 4 MG PO TABS: 4.0000 mg | ORAL_TABLET | ORAL | Status: DC | PRN

## 2018-06-30 MED ORDER — COCONUT OIL OIL
1.0000 "application " | TOPICAL_OIL | Status: DC | PRN
  Filled 2018-06-30: qty 120

## 2018-06-30 MED ORDER — WITCH HAZEL-GLYCERIN EX PADS: 1.0000 "application " | MEDICATED_PAD | CUTANEOUS | Status: DC | PRN

## 2018-06-30 MED ORDER — BENZOCAINE-MENTHOL 20-0.5 % EX AERO: 1.0000 "application " | INHALATION_SPRAY | CUTANEOUS | Status: DC | PRN

## 2018-06-30 MED ORDER — INFLUENZA VAC SPLIT QUAD 0.5 ML IM SUSY
0.5000 mL | PREFILLED_SYRINGE | INTRAMUSCULAR | Status: AC
  Administered 2018-07-01: 0.5 mL via INTRAMUSCULAR

## 2018-06-30 MED ORDER — TETANUS-DIPHTH-ACELL PERTUSSIS 5-2.5-18.5 LF-MCG/0.5 IM SUSP: 0.5000 mL | Freq: Once | INTRAMUSCULAR | Status: DC

## 2018-06-30 MED ORDER — DIPHENHYDRAMINE HCL 25 MG PO CAPS: 25.0000 mg | ORAL_CAPSULE | Freq: Four times a day (QID) | ORAL | Status: DC | PRN

## 2018-06-30 MED ORDER — PRENATAL MULTIVITAMIN CH
1.0000 | ORAL_TABLET | Freq: Every day | ORAL | Status: DC
  Administered 2018-06-30 – 2018-07-01 (×2): 1 via ORAL
  Filled 2018-06-30 (×2): qty 1

## 2018-06-30 MED ORDER — ACETAMINOPHEN 325 MG PO TABS
650.0000 mg | ORAL_TABLET | ORAL | Status: DC | PRN
  Administered 2018-06-30 – 2018-07-02 (×10): 650 mg via ORAL
  Filled 2018-06-30 (×10): qty 2

## 2018-06-30 MED ORDER — IBUPROFEN 600 MG PO TABS
600.0000 mg | ORAL_TABLET | Freq: Four times a day (QID) | ORAL | Status: DC
  Administered 2018-06-30 – 2018-07-02 (×9): 600 mg via ORAL
  Filled 2018-06-30 (×9): qty 1

## 2018-06-30 MED ORDER — DIBUCAINE 1 % RE OINT: 1.0000 "application " | TOPICAL_OINTMENT | RECTAL | Status: DC | PRN

## 2018-06-30 MED ORDER — SIMETHICONE 80 MG PO CHEW: 80.0000 mg | CHEWABLE_TABLET | ORAL | Status: DC | PRN

## 2018-06-30 MED ORDER — ZOLPIDEM TARTRATE 5 MG PO TABS: 5.0000 mg | ORAL_TABLET | Freq: Every evening | ORAL | Status: DC | PRN

## 2018-06-30 MED ORDER — SENNOSIDES-DOCUSATE SODIUM 8.6-50 MG PO TABS
2.0000 | ORAL_TABLET | ORAL | Status: DC
  Administered 2018-06-30 – 2018-07-02 (×2): 2 via ORAL
  Filled 2018-06-30 (×2): qty 2

## 2018-07-01 DIAGNOSIS — Z8759 Personal history of other complications of pregnancy, childbirth and the puerperium: Secondary | ICD-10-CM

## 2018-07-01 LAB — CBC
HEMATOCRIT: 33.4 % — AB (ref 36.0–46.0)
Hemoglobin: 11 g/dL — ABNORMAL LOW (ref 12.0–15.0)
MCH: 26.2 pg (ref 26.0–34.0)
MCHC: 32.9 g/dL (ref 30.0–36.0)
MCV: 79.5 fL (ref 78.0–100.0)
Platelets: 192 10*3/uL (ref 150–400)
RBC: 4.2 MIL/uL (ref 3.87–5.11)
RDW: 14.7 % (ref 11.5–15.5)
WBC: 7.3 10*3/uL (ref 4.0–10.5)

## 2018-07-01 LAB — COMPREHENSIVE METABOLIC PANEL
ALT: 17 U/L (ref 0–44)
AST: 26 U/L (ref 15–41)
Albumin: 2.8 g/dL — ABNORMAL LOW (ref 3.5–5.0)
Alkaline Phosphatase: 136 U/L — ABNORMAL HIGH (ref 38–126)
Anion gap: 7 (ref 5–15)
BUN: 8 mg/dL (ref 6–20)
CHLORIDE: 107 mmol/L (ref 98–111)
CO2: 26 mmol/L (ref 22–32)
CREATININE: 0.54 mg/dL (ref 0.44–1.00)
Calcium: 8.4 mg/dL — ABNORMAL LOW (ref 8.9–10.3)
GFR calc Af Amer: >60 mL/min (ref >60)
Glucose, Bld: 85 mg/dL (ref 70–99)
Potassium: 4 mmol/L (ref 3.5–5.1)
Sodium: 140 mmol/L (ref 135–145)
Total Bilirubin: 0.5 mg/dL (ref 0.3–1.2)
Total Protein: 5.8 g/dL — ABNORMAL LOW (ref 6.5–8.1)

## 2018-07-01 LAB — URIC ACID: URIC ACID, SERUM: 5.2 mg/dL (ref 2.5–7.1)

## 2018-07-01 LAB — RPR: RPR: NONREACTIVE

## 2018-07-01 MED ORDER — NIFEDIPINE ER OSMOTIC RELEASE 30 MG PO TB24
30.0000 mg | ORAL_TABLET | Freq: Every day | ORAL | Status: DC
  Administered 2018-07-01: 30 mg via ORAL
  Filled 2018-07-01: qty 1

## 2018-07-01 MED ORDER — OXYCODONE HCL 5 MG PO TABS: 5.0000 mg | ORAL_TABLET | ORAL | Status: DC | PRN

## 2018-07-01 NOTE — Discharge Summary (Signed)
SVD OB Discharge Summary     Patient Name: Jasmine Flores DOB: [birth year] MRN: [id number]  Date of admission: 06/29/2018 Delivering MD: Starla Link  Date of delivery: 06/30/2018 Type of delivery: SVD  Newborn Data: Sex: Baby Female Circumcision: No Live born female  Birth Weight: 4 lb 12.7 oz (2175 g) APGAR: 76, 9  Newborn Delivery   Birth date/time:  06/30/2018 00:27:00 Delivery type:  Vaginal, Spontaneous     Feeding: breast Infant being discharge to home with mother in stable condition.   Admitting diagnosis: CTX Intrauterine pregnancy: [redacted]w[redacted]d     Secondary diagnosis:  Active Problems:   SVD (spontaneous vaginal delivery)   Gestational hypertension   Preterm delivery   Normal postpartum course                                Complications: None                                                              Intrapartum Procedures: spontaneous vaginal delivery Postpartum Procedures: none Complications-Operative and Postpartum: none Augmentation: AROM   History of Present Illness: Ms. Jasmine Flores is a 32 y.o. female, [id number], who presents at [redacted]w[redacted]d weeks gestation. The patient has been followed at  [facility] and Gyne[facility]  Her pregnancy has been complicated by: Patient Active Problem List   Diagnosis Date Noted  . Gestational hypertension 07/01/2018  . Preterm delivery 07/01/2018  . Normal postpartum course 07/01/2018  . SVD (spontaneous vaginal delivery) 06/30/2018  . Cough 01/21/2018  . Palpitations 06/22/2017  . 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 Joint pain 06/22/2017  . Lung nodule < 6cm on CT 12/28/2016  . Other chest pain 12/28/2016  . Pituitary adenoma ([place]) 09/08/2016  . Asthma 09/03/2015  . Hx of migraines 09/03/2015  . Allergy to shellfish 09/03/2015   Hospital course:  Onset of Labor With Vaginal Delivery     32 y.o. yo [id number] at [redacted]w[redacted]d was admitted in Active Labor with preterm labor on 06/29/2018. Patient had  an uncomplicated labor course as follows:  Membrane Rupture Time/Date: 10:13 PM ,06/29/2018   Intrapartum Procedures: Episiotomy: None [1]                                         Lacerations:  None [1]  Patient had a delivery of a Viable infant. 06/30/2018  Information for the patient's newborn:  Neysa, Arts [[id number]]  Delivery Method: Vaginal, Spontaneous(Filed from Delivery Summary)    Pateint had an uncomplicated postpartum course.  She is ambulating, tolerating a regular diet, passing flatus, and urinating well. Patient is discharged home in stable condition on 07/02/18.  Postpartum Day # 3 : S/P NSVD due to preterm labor in active labor with GHTN no meds needed during pregnancy. Patient up ad lib, denies syncope or dizziness. Reports consuming regular diet without issues and denies N/V. Patient reports 0 bowel movement + passing flatus.  Denies issues with urination and reports bleeding is "light."  Patient is Breastfeeding and reports going well.  Desires undecided for postpartum contraception.  Pain is being appropriately managed with use  of motrin. Pt require to be started on procardia 30mg  XL daily for increased diastolic BP ranges in the 90s. Today pt still having increased diastolic above 04V therefore I increased procardia to 60mg  XL daily. Pt denies cp, sob, n, v, HA, vision changes, or RUQ pain.   Physical exam  Vitals:   07/01/18 1430 07/01/18 2237 07/02/18 0100 07/02/18 0556  BP: (!) 132/97 (!) 147/102 (!) 137/97 (!) 142/97  Pulse: 83 80    Resp: 18 18    Temp: 98 F (36.7 C) 97.7 F (36.5 C)  98.2 F (36.8 C)  TempSrc: Oral Oral  Oral  SpO2:      Weight:      Height:       General: alert, cooperative and no distress  Resp: CTA Bi-lat Cardiac: RRR Lochia: appropriate Uterine Fundus: firm Perineum: Intact, no hematomas DVT Evaluation: No evidence of DVT seen on physical exam. Negative Homan's sign. No cords or calf tenderness. No significant calf/ankle  edema. DTR 2+, No clonus  Labs: Lab Results  Component Value Date   WBC 7.3 07/01/2018   HGB 11.0 (L) 07/01/2018   HCT 33.4 (L) 07/01/2018   MCV 79.5 07/01/2018   PLT 192 07/01/2018   CMP Latest Ref Rng & Units 07/01/2018  Glucose 70 - 99 mg/dL 85  BUN 6 - 20 mg/dL 8  Creatinine 0.44 - 1.00 mg/dL 0.54  Sodium 135 - 145 mmol/L 140  Potassium 3.5 - 5.1 mmol/L 4.0  Chloride 98 - 111 mmol/L 107  CO2 22 - 32 mmol/L 26  Calcium 8.9 - 10.3 mg/dL 8.4(L)  Total Protein 6.5 - 8.1 g/dL 5.8(L)  Total Bilirubin 0.3 - 1.2 mg/dL 0.5  Alkaline Phos 38 - 126 U/L 136(H)  AST 15 - 41 U/L 26  ALT 0 - 44 U/L 17    Date of discharge: 07/02/2018 Discharge Diagnoses: Premature labor and preterm birth with GHTN.  Discharge instruction: per After Visit Summary and "Baby and Me Booklet".  After visit meds:  Allergies as of 07/02/2018      Reactions   Shellfish Allergy Itching, Swelling, Shortness Of Breath, Anaphylaxis      Medication List    TAKE these medications   acetaminophen 500 MG tablet Commonly known as:  TYLENOL Take 1,000 mg by mouth every 8 (eight) hours as needed for mild pain or headache.   albuterol 108 (90 Base) MCG/ACT inhaler Commonly known as:  PROVENTIL HFA;VENTOLIN HFA Inhale 2 puffs into the lungs every 6 (six) hours as needed for wheezing or shortness of breath.   Butalbital-APAP-Caffeine 50-300-40 MG Caps Take 1 tablet by mouth every 4 (four) hours as needed (headaches).   calcium carbonate 500 MG chewable tablet Commonly known as:  TUMS - dosed in mg elemental calcium Chew 2 tablets by mouth at bedtime as needed for indigestion or heartburn.   cetirizine 10 MG tablet Commonly known as:  ZYRTEC Take 10 mg by mouth daily as needed for allergies.   fluticasone 50 MCG/ACT nasal spray Commonly known as:  FLONASE Place 2 sprays into both nostrils daily.   ibuprofen 600 MG tablet Commonly known as:  ADVIL,MOTRIN Take 1 tablet (600 mg total) by mouth every 6 (six)  hours.   metoCLOPramide 10 MG tablet Commonly known as:  REGLAN Take 10 mg by mouth every 6 (six) hours as needed for nausea.   NIFEdipine 60 MG 24 hr tablet Commonly known as:  PROCARDIA-XL/ADALAT CC Take 1 tablet (60 mg total) by mouth daily.  PRENATAL VITAMIN PO Take 1 tablet by mouth every evening.   promethazine 25 MG tablet Commonly known as:  PHENERGAN TAKE 1 TABLET(25 MG) BY MOUTH EVERY 6 HOURS AS NEEDED FOR NAUSEA OR VOMITING   valACYclovir 500 MG tablet Commonly known as:  VALTREX Take 500 mg by mouth 2 (two) times daily.       Activity:           pelvic rest Advance as tolerated. Pelvic rest for 6 weeks.  Diet:                routine Medications: PNV and Ibuprofen Postpartum contraception: Undecided Condition:  Pt discharge to home with baby in stable GHTN: Continue on procardia 60mg  XL daily, with baby to visit home, and BP check with provider in office in one week.   Meds: Allergies as of 07/02/2018      Reactions   Shellfish Allergy Itching, Swelling, Shortness Of Breath, Anaphylaxis      Medication List    TAKE these medications   acetaminophen 500 MG tablet Commonly known as:  TYLENOL Take 1,000 mg by mouth every 8 (eight) hours as needed for mild pain or headache.   albuterol 108 (90 Base) MCG/ACT inhaler Commonly known as:  PROVENTIL HFA;VENTOLIN HFA Inhale 2 puffs into the lungs every 6 (six) hours as needed for wheezing or shortness of breath.   Butalbital-APAP-Caffeine 50-300-40 MG Caps Take 1 tablet by mouth every 4 (four) hours as needed (headaches).   calcium carbonate 500 MG chewable tablet Commonly known as:  TUMS - dosed in mg elemental calcium Chew 2 tablets by mouth at bedtime as needed for indigestion or heartburn.   cetirizine 10 MG tablet Commonly known as:  ZYRTEC Take 10 mg by mouth daily as needed for allergies.   fluticasone 50 MCG/ACT nasal spray Commonly known as:  FLONASE Place 2 sprays into both nostrils daily.    ibuprofen 600 MG tablet Commonly known as:  ADVIL,MOTRIN Take 1 tablet (600 mg total) by mouth every 6 (six) hours.   metoCLOPramide 10 MG tablet Commonly known as:  REGLAN Take 10 mg by mouth every 6 (six) hours as needed for nausea.   NIFEdipine 60 MG 24 hr tablet Commonly known as:  PROCARDIA-XL/ADALAT CC Take 1 tablet (60 mg total) by mouth daily.   PRENATAL VITAMIN PO Take 1 tablet by mouth every evening.   promethazine 25 MG tablet Commonly known as:  PHENERGAN TAKE 1 TABLET(25 MG) BY MOUTH EVERY 6 HOURS AS NEEDED FOR NAUSEA OR VOMITING   valACYclovir 500 MG tablet Commonly known as:  VALTREX Take 500 mg by mouth 2 (two) times daily.       Discharge Follow Up:  Follow-up [facility] Follow up.   Specialty:  Obstetrics and Gynecology Why:  Pt to make an appointment for 1 week BP check with provder as well as a 6 weeks PPV.  Contact information: [address] [place] [id number] [place], NP-C, [place] 07/02/2018, 6:31 AM  Noralyn Pick, FNP

## 2018-07-01 NOTE — Progress Notes (Addendum)
Subjective: Postpartum Day 1: Vaginal delivery, no laceration Patient up ad lib, reports no syncope or dizziness. Feeding:  Breast Contraceptive plan:  Undecided, prior use of OCPs  Denies HA, visual sx, or epigastric pain.  Notes significant muscle soreness since delivery, taking Tylenol and Ibuprophen ATC with some benefit, but not enough.  Objective: Vital signs in last 24 hours: Temp:  [97.9 F (36.6 C)-98.5 F (36.9 C)] 98 F (36.7 C) (09/26 0500) Pulse Rate:  [74-93] 74 (09/26 0500) Resp:  [18-20] 18 (09/26 0500) BP: (120-140)/(83-97) 130/97 (09/26 0500)   Vitals:   06/30/18 1143 06/30/18 1548 07/01/18 0009 07/01/18 0500  BP: (!) 129/96 (!) 140/96 126/84 (!) 130/97  Pulse: 93 89 74 74  Resp: 18 18 20 18   Temp: 98.4 F (36.9 C) 98 F (36.7 C) 97.9 F (36.6 C) 98 F (36.7 C)  TempSrc: Oral Oral Oral Oral  SpO2:      Weight:      Height:        Physical Exam:  General: alert Lochia: appropriate Uterine Fundus: firm Perineum: Intact DVT Evaluation: No evidence of DVT seen on physical exam. Calf/Ankle edema is present, 1+, DTR 1+, no clonus   CBC Latest Ref Rng & Units 07/01/2018 06/30/2018 06/29/2018  WBC 4.0 - 10.5 K/uL 7.3 9.2 7.1  Hemoglobin 12.0 - 15.0 g/dL 11.0(L) 11.6(L) 13.3  Hematocrit 36.0 - 46.0 % 33.4(L) 34.6(L) 39.3  Platelets 150 - 400 K/uL 192 199 225     Assessment/Plan: Status post vaginal delivery day 1 Gestational hypertension. Stable Continue current care. Per consult with Dr. Cletis Media, start Procardia 30 mg XL daily. Oxycodone 5 mg po q 4 hours prn, as adjunct to Tylenol/ibuprophen. Anticipate d/c tomorrow if BP stable. Discussed contraception--will send home with information for patient to review, per her request. Plan [facility] RN visit early next week for BP f/u.   Donnel Saxon CNM 07/01/2018, 7:50 AM

## 2018-07-01 NOTE — Lactation Note (Signed)
This note was copied from a baby's chart. Lactation Consultation Note Baby 66 hrs old LPI wt. 4.12 lbs. Mom breast/formula feeding 22 cal. Similac. States BF going well, denies painful latching. Mom resting w/baby. Mom states having no difficulty. Gave mom LPI information sheet and care of LPI. Reviewed supplementing and feeding time. Mom BF 1 yr her 1st child. Mom using DEBP at bedside. Mom knows to pump q3h for 15-20 min. Reviewed supply and demand, STS, I&O. Encouraged mom to call for assistance or questions.  [facility] brochure given w/resources, support groups and [facility] services.  Patient Name: Girl Shamiyah Ngu [id number] Date: 07/01/2018 Reason for consult: Initial assessment;Late-preterm 34-36.6wks   Maternal Data Has patient been taught Hand Expression?: Yes Does the patient have breastfeeding experience prior to this delivery?: Yes  Feeding Feeding Type: Breast Fed Length of feed: 15 min  LATCH Score       Type of Nipple: Everted at rest and after stimulation  Comfort (Breast/Nipple): Soft / non-tender  Hold (Positioning): No assistance needed to correctly position infant at breast.     Interventions Interventions: Breast feeding basics reviewed;DEBP;Skin to skin  Lactation Tools Discussed/Used Tools: Pump Breast pump type: Double-Electric Breast Pump   Consult Status Consult Status: Follow-up Date: 07/02/18 Follow-up type: In-patient    Theodoro Kalata 07/01/2018, 3:18 AM

## 2018-07-01 NOTE — Lactation Note (Signed)
This note was copied from a baby's chart. Lactation Consultation Note  Patient Name: Jasmine Flores [id number] Date: 07/01/2018 Reason for consult: Late-preterm 34-36.6wks;Follow-up assessment;Infant < 6lbs  P2 mother whose infant is now 48 hours old.  Mother breast fed her first child (who was an ETI ) for 1 year.  Mother was getting ready to breast feed as I entered.  Offered to observe/assist with latching and mother accepted.  She stated that baby has been showing feeding cues when ready to eat.    Assisted to latch in the cross cradle hold on the left breast without difficulty.  Baby had wide flanged lips and good rhythmic sucking.  Mother's nipples had been tender and I suggested EBM and coconut oil to nipples and areolas.  RN brought in coconut oil for mother.  Mother stated relief with latching after using coconut oil.  Mother denied pain with latching.  Encouraged to feed 8-12 times/24 hours or sooner if baby shows feeding cues.  Mother will awaken at 3 hours if she is still sleeping.  Volume guidelines for supplementation reviewed and mother is feeding appropriate amounts.  She stated her first child was an ETI and she went through the same feeding process with her.  She is also aware that she will automatically be spending an extra night in the hospital.    Mother will call for latch assistance as needed.     Maternal Data Formula Feeding for Exclusion: No Has patient been taught Hand Expression?: Yes Does the patient have breastfeeding experience prior to this delivery?: Yes  Feeding Feeding Type: Breast Fed Nipple Type: Slow - flow Length of feed: 30 min  LATCH Score Latch: Grasps breast easily, tongue down, lips flanged, rhythmical sucking.  Audible Swallowing: A few with stimulation  Type of Nipple: Everted at rest and after stimulation  Comfort (Breast/Nipple): Soft / non-tender  Hold (Positioning): Assistance needed to correctly position infant at breast and  maintain latch.  LATCH Score: 8  Interventions Interventions: Breast feeding basics reviewed;Assisted with latch;Skin to skin;Breast massage;Hand express  Lactation Tools Discussed/Used Tools: Coconut oil   Consult Status Consult Status: Follow-up Date: 07/02/18 Follow-up type: In-patient    Laverne Hursey R Onie Kasparek 07/01/2018, 4:09 PM

## 2018-07-01 NOTE — Discharge Instructions (Signed)
Contraception Choices Contraception, also called birth control, refers to methods or devices that prevent pregnancy. Hormonal methods Contraceptive implant A contraceptive implant is a thin, plastic tube that contains a hormone. It is inserted into the upper part of the arm. It can remain in place for up to 3 years. Progestin-only injections Progestin-only injections are injections of progestin, a synthetic form of the hormone progesterone. They are given every 3 months by a health care provider. Birth control pills Birth control pills are pills that contain hormones that prevent pregnancy. They must be taken once a day, preferably at the same time each day. Birth control patch The birth control patch contains hormones that prevent pregnancy. It is placed on the skin and must be changed once a week for three weeks and removed on the fourth week. A prescription is needed to use this method of contraception. Vaginal ring A vaginal ring contains hormones that prevent pregnancy. It is placed in the vagina for three weeks and removed on the fourth week. After that, the process is repeated with a new ring. A prescription is needed to use this method of contraception. Emergency contraceptive Emergency contraceptives prevent pregnancy after unprotected sex. They come in pill form and can be taken up to 5 days after sex. They work best the sooner they are taken after having sex. Most emergency contraceptives are available without a prescription. This method should not be used as your only form of birth control. Barrier methods Female condom A female condom is a thin sheath that is worn over the penis during sex. Condoms keep sperm from going inside a woman's body. They can be used with a spermicide to increase their effectiveness. They should be disposed after a single use. Female condom A female condom is a soft, loose-fitting sheath that is put into the vagina before sex. The condom keeps sperm from going  inside a woman's body. They should be disposed after a single use. Diaphragm A diaphragm is a soft, dome-shaped barrier. It is inserted into the vagina before sex, along with a spermicide. The diaphragm blocks sperm from entering the uterus, and the spermicide kills sperm. A diaphragm should be left in the vagina for 6-8 hours after sex and removed within 24 hours. A diaphragm is prescribed and fitted by a health care provider. A diaphragm should be replaced every 1-2 years, after giving birth, after gaining more than 15 lb (6.8 kg), and after pelvic surgery. Cervical cap A cervical cap is a round, soft latex or plastic cup that fits over the cervix. It is inserted into the vagina before sex, along with spermicide. It blocks sperm from entering the uterus. The cap should be left in place for 6-8 hours after sex and removed within 48 hours. A cervical cap must be prescribed and fitted by a health care provider. It should be replaced every 2 years. Sponge A sponge is a soft, circular piece of polyurethane foam with spermicide on it. The sponge helps block sperm from entering the uterus, and the spermicide kills sperm. To use it, you make it wet and then insert it into the vagina. It should be inserted before sex, left in for at least 6 hours after sex, and removed and thrown away within 30 hours. Spermicides Spermicides are chemicals that kill or block sperm from entering the cervix and uterus. They can come as a cream, jelly, suppository, foam, or tablet. A spermicide should be inserted into the vagina with an applicator at least 10-27 minutes before  sex to allow time for it to work. The process must be repeated every time you have sex. Spermicides do not require a prescription. Intrauterine contraception Intrauterine device (IUD) An IUD is a T-shaped device that is put in a woman's uterus. There are two types:  Hormone IUD.This type contains progestin, a synthetic form of the hormone progesterone. This  type can stay in place for 3-5 years.  Copper IUD.This type is wrapped in copper wire. It can stay in place for 10 years.  Permanent methods of contraception Female tubal ligation In this method, a woman's fallopian tubes are sealed, tied, or blocked during surgery to prevent eggs from traveling to the uterus. Hysteroscopic sterilization In this method, a small, flexible insert is placed into each fallopian tube. The inserts cause scar tissue to form in the fallopian tubes and block them, so sperm cannot reach an egg. The procedure takes about 3 months to be effective. Another form of birth control must be used during those 3 months. Female sterilization This is a procedure to tie off the tubes that carry sperm (vasectomy). After the procedure, the man can still ejaculate fluid (semen). Natural planning methods Natural family planning In this method, a couple does not have sex on days when the woman could become pregnant. Calendar method This means keeping track of the length of each menstrual cycle, identifying the days when pregnancy can happen, and not having sex on those days. Ovulation method In this method, a couple avoids sex during ovulation. Symptothermal method This method involves not having sex during ovulation. The woman typically checks for ovulation by watching changes in her temperature and in the consistency of cervical mucus. Post-ovulation method In this method, a couple waits to have sex until after ovulation. Summary  Contraception, also called birth control, means methods or devices that prevent pregnancy.  Hormonal methods of contraception include implants, injections, pills, patches, vaginal rings, and emergency contraceptives.  Barrier methods of contraception can include female condoms, female condoms, diaphragms, cervical caps, sponges, and spermicides.  There are two types of IUDs (intrauterine devices). An IUD can be put in a woman's uterus to prevent pregnancy  for 3-5 years.  Permanent sterilization can be done through a procedure for males, females, or both.  Natural family planning methods involve not having sex on days when the woman could become pregnant. This information is not intended to replace advice given to you by your health care provider. Make sure you discuss any questions you have with your health care provider. Document Released: 09/22/2005 Document Revised: 10/25/2016 Document Reviewed: 10/25/2016 Elsevier Interactive Patient Education  2018 [employer].

## 2018-07-02 MED ORDER — IBUPROFEN 600 MG PO TABS: 600.0000 mg | ORAL_TABLET | Freq: Four times a day (QID) | ORAL | 0 refills | Status: DC

## 2018-07-02 MED ORDER — NIFEDIPINE ER 60 MG PO TB24: 60.0000 mg | ORAL_TABLET | Freq: Every day | ORAL | 1 refills | Status: DC

## 2018-07-02 MED ORDER — NIFEDIPINE ER OSMOTIC RELEASE 30 MG PO TB24
60.0000 mg | ORAL_TABLET | Freq: Every day | ORAL | Status: DC
  Administered 2018-07-02: 60 mg via ORAL
  Filled 2018-07-02: qty 2

## 2018-07-02 NOTE — Lactation Note (Signed)
This note was copied from a baby's chart. Lactation Consultation Note  Patient Name: Girl Raelynne Ludwick HYIFO'Y Date: 07/02/2018   Mom reports they are being discharged today.  Mom is a [employer] employee and wants the Medela Freestyle pump.  Mom reports had the medela pump n style with last baby and wants something different this time. Baby girl [name] is LPTI at 3 percent weight loss.  Mom is doing both breastfeeding, pumping and formula feeding. Mom reports she had an early term small baby before and knows what to do. Mom denies need for Lactation Services at this time.  Mom has Breastfeeding [employer] and [employer] Breastfeeding consultation services handouts.  Will f/u as needed  Maternal Data    Feeding Feeding Type: Breast Fed Length of feed: 20 min  LATCH Score Latch: Grasps breast easily, tongue down, lips flanged, rhythmical sucking.  Audible Swallowing: A few with stimulation  Type of Nipple: Everted at rest and after stimulation  Comfort (Breast/Nipple): Soft / non-tender  Hold (Positioning): No assistance needed to correctly position infant at breast.  [facility] Score: 9  Interventions    Lactation Tools Discussed/Used     Consult Status      Lavonta Tillis Thompson Caul 07/02/2018, 10:25 AM

## 2018-07-02 NOTE — Anesthesia Postprocedure Evaluation (Signed)
Anesthesia Post Note  Patient: Jasmine Flores  Procedure(s) Performed: AN AD [place]     Patient location during evaluation: Mother Baby Anesthesia Type: Epidural Level of consciousness: awake and alert Pain management: pain level controlled Vital Signs Assessment: post-procedure vital signs reviewed and stable Respiratory status: spontaneous breathing, nonlabored ventilation and respiratory function stable Cardiovascular status: stable Postop Assessment: no headache, no backache and epidural receding Anesthetic complications: no Comments: Chart review only     Last Vitals:  Vitals:   07/02/18 0100 07/02/18 0556  BP: (!) 137/97 (!) 142/97  Pulse:    Resp:    Temp:  36.8 C  SpO2:      Last Pain:  Vitals:   07/02/18 0725  TempSrc:   PainSc: 0-No pain                 Barnet Glasgow

## 2018-07-03 ENCOUNTER — Inpatient Hospital Stay (HOSPITAL_COMMUNITY): Admission: RE | Admit: 2018-07-03 | Payer: 59 | Source: Ambulatory Visit

## 2018-07-03 ENCOUNTER — Inpatient Hospital Stay (HOSPITAL_COMMUNITY): Payer: 59

## 2018-07-04 ENCOUNTER — Inpatient Hospital Stay (HOSPITAL_COMMUNITY): Payer: 59

## 2018-07-20 DIAGNOSIS — G5602 Carpal tunnel syndrome, left upper limb: Secondary | ICD-10-CM | POA: Diagnosis not present

## 2018-07-20 DIAGNOSIS — R200 Anesthesia of skin: Secondary | ICD-10-CM | POA: Diagnosis not present

## 2018-08-11 DIAGNOSIS — Z113 Encounter for screening for infections with a predominantly sexual mode of transmission: Secondary | ICD-10-CM | POA: Diagnosis not present

## 2018-08-11 DIAGNOSIS — Z3040 Encounter for surveillance of contraceptives, unspecified: Secondary | ICD-10-CM | POA: Diagnosis not present

## 2018-09-18 IMAGING — DX DG CHEST 2V
2 series · 2 of 2 positions shown · non-contrast
Comparison: None.

CLINICAL DATA: Initial evaluation for intermittent chest pain,
shortness of breath.

EXAM:
CHEST  2 VIEW

[chest pa]
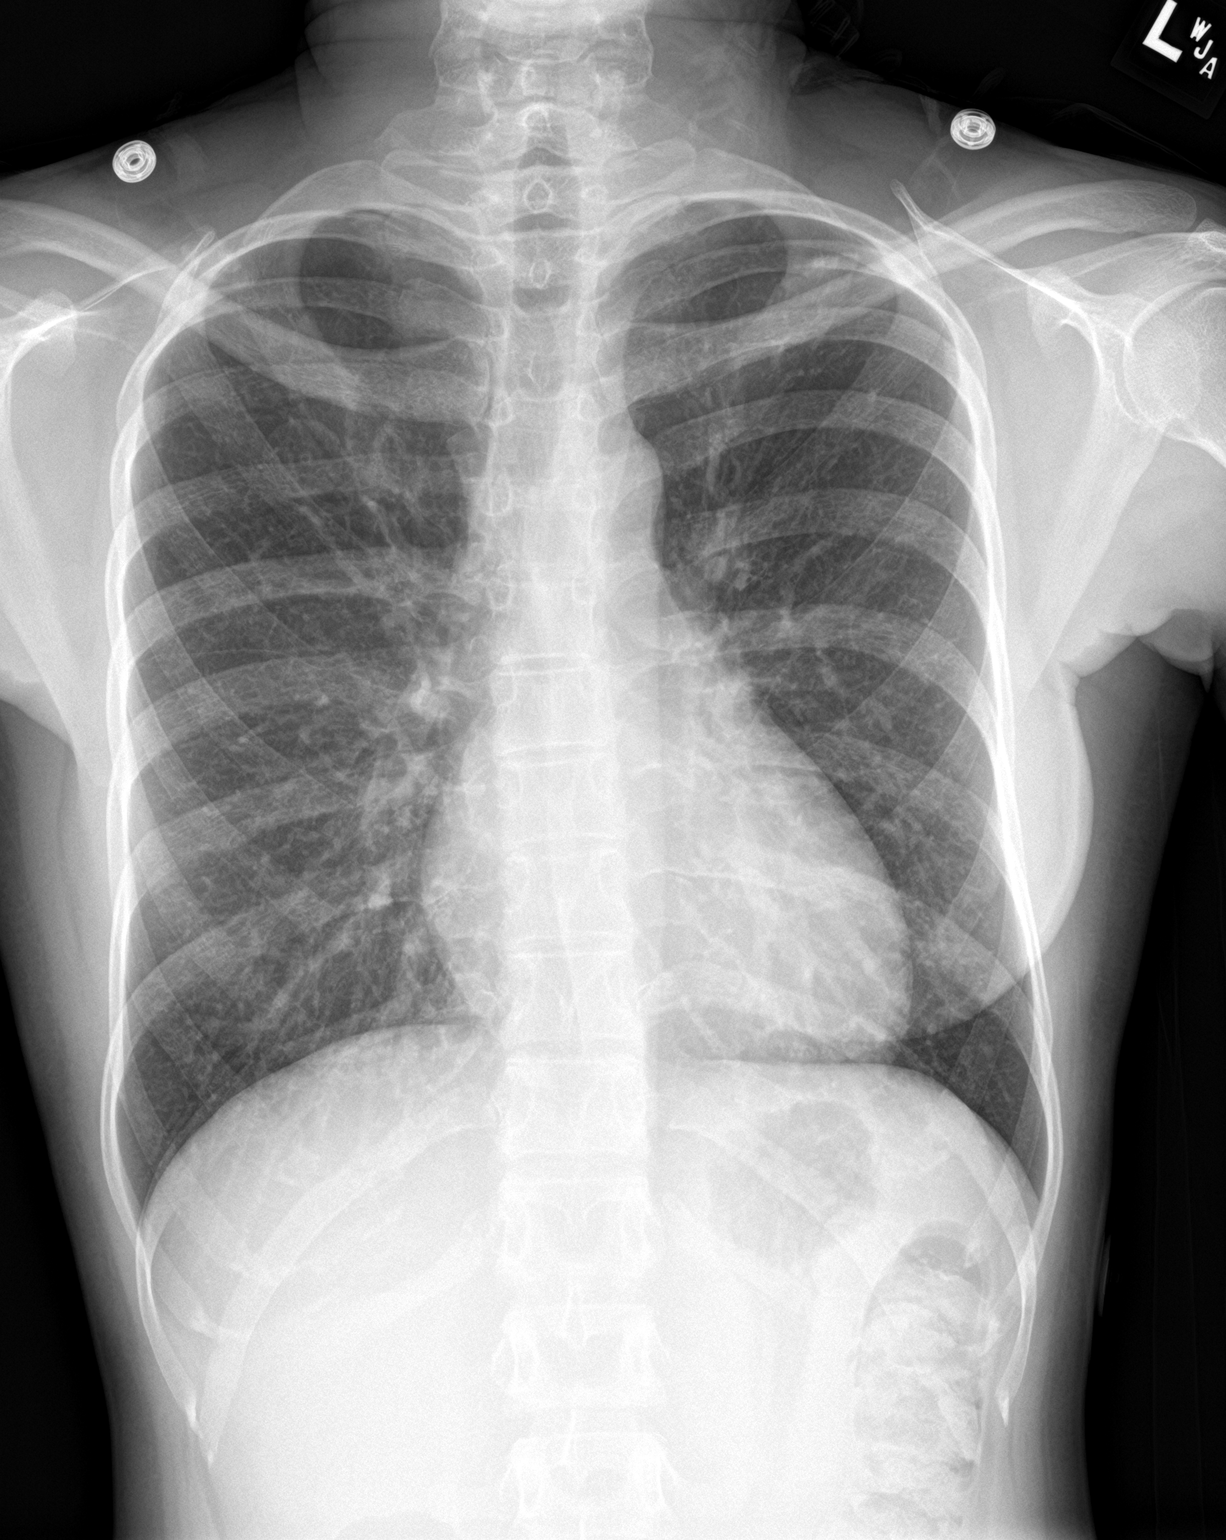

[chest lat]
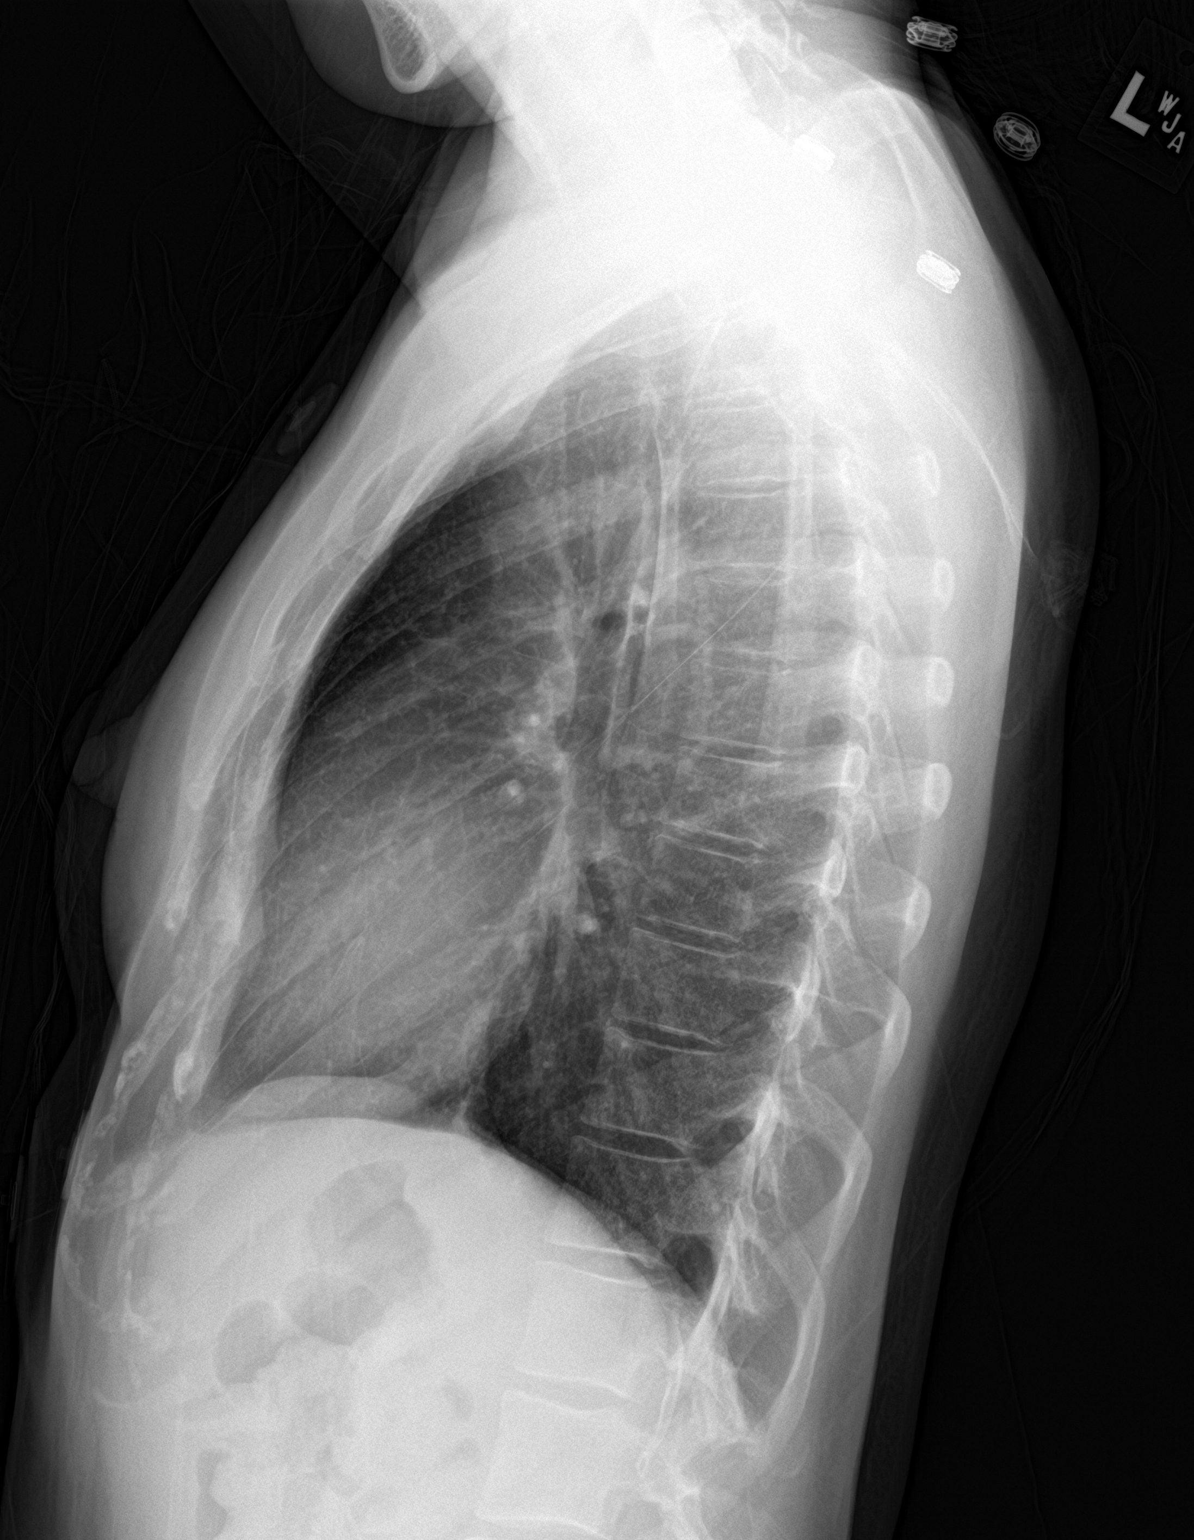

[2 of 2 positions shown; findings below may reference images not displayed]

FINDINGS: The cardiac and mediastinal silhouettes are within normal limits.

The lungs are normally inflated. No airspace consolidation, pleural
effusion, or pulmonary edema is identified. There is no
pneumothorax.

No acute osseous abnormality identified.
IMPRESSION: No active cardiopulmonary disease.

## 2018-09-22 DIAGNOSIS — H52223 Regular astigmatism, bilateral: Secondary | ICD-10-CM | POA: Diagnosis not present

## 2018-10-21 ENCOUNTER — Encounter: Payer: 59 | Admitting: Internal Medicine

## 2018-10-31 NOTE — Progress Notes (Signed)
Subjective:    Patient ID: Jasmine Flores, female    DOB: 1986-02-11, 33 y.o.   MRN: [id number]  HPI She is here for a physical exam.    Elevated blood pressure:  She had gestational hypertension in her third trimester and for 6 weeks postpartum.  She was on nifedipine, but has been off the medication.  She has been monitoring her blood pressure and it has remained in the 120s/80s.  Overall she feels well and has no concerns.   Medications and allergies reviewed with patient and updated if appropriate.  Patient Active Problem List   Diagnosis Date Noted  . Gestational hypertension 07/01/2018  . Cough 01/21/2018  . Palpitations 06/22/2017  . 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 Joint pain 06/22/2017  . Lung nodule < 6cm on CT 12/28/2016  . Other chest pain 12/28/2016  . Pituitary adenoma ([place]) 09/08/2016  . Asthma 09/03/2015  . Hx of migraines 09/03/2015    Current Outpatient Medications on File Prior to Visit  Medication Sig Dispense Refill  . acetaminophen (TYLENOL) 500 MG tablet Take 1,000 mg by mouth every 8 (eight) hours as needed for mild pain or headache.    . albuterol (PROVENTIL HFA;VENTOLIN HFA) 108 (90 Base) MCG/ACT inhaler Inhale 2 puffs into the lungs every 6 (six) hours as needed for wheezing or shortness of breath. 1 Inhaler 0  . Butalbital-APAP-Caffeine 50-300-40 MG CAPS Take 1 tablet by mouth every 4 (four) hours as needed (headaches).   2  . Prenatal Vit-Fe Fumarate-FA (PRENATAL VITAMIN PO) Take 1 tablet by mouth every evening.      No current facility-administered medications on file prior to visit.     Past Medical History:  Diagnosis Date  . Alpha trait thalassemia   . Anemia   . Asthma   . Headache   . HSV infection   . Migraines   . Positive TB test   . Pregnancy induced hypertension   . Vaginal Pap smear, abnormal     Past Surgical History:  Procedure Laterality Date  . 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 TONSILECTOMY,  ADENOIDECTOMY, BILATERAL MYRINGOTOMY AND TUBES    . TONSILLECTOMY     and adenoids  . WISDOM TOOTH EXTRACTION      Social History   Socioeconomic History  . Marital status: Single    Spouse name: Not on file  . Number of children: Not on file  . Years of education: Not on file  . Highest education level: Not on file  Occupational History  . Not on file  Social Needs  . Financial resource strain: Not hard at all  . Food insecurity:    Worry: Never true    Inability: Never true  . Transportation needs:    Medical: No    Non-medical: Not on file  Tobacco Use  . Smoking status: Never Smoker  . Smokeless tobacco: Never Used  Substance and Sexual Activity  . Alcohol use: No  . Drug use: No  . Sexual activity: Yes    Birth control/protection: None  Lifestyle  . Physical activity:    Days per week: 0 days    Minutes per session: 0 min  . Stress: To some extent  Relationships  . Social connections:    Talks on phone: Not on file    Gets together: Not on file    Attends religious service: Not on file    Active member of club or organization: Not on file  Attends meetings of clubs or organizations: Not on file    Relationship status: Not on file  Other Topics Concern  . Not on file  Social History Narrative   Works at [employer]   38 months old     Family History  Problem Relation Age of Onset  . Diabetes Mother   . Hypertension Mother   . Diabetes Father   . Hypertension Father   . Stroke Father   . Hypertension Maternal Grandmother   . Diabetes Maternal Grandmother   . Hypertension Maternal Grandfather   . Diabetes Maternal Grandfather     Review of Systems  Constitutional: Negative for chills and fever.  Eyes: Negative for visual disturbance.  Respiratory: Negative for cough, shortness of breath and wheezing.   Cardiovascular: Positive for palpitations (occ, chronic). Negative for chest pain and leg swelling.  Gastrointestinal: Negative for abdominal pain,  blood in stool, constipation, diarrhea and nausea.       No gerd  Genitourinary: Negative for dysuria and hematuria.  Musculoskeletal: Negative for arthralgias.  Skin: Negative for color change and rash.  Neurological: Positive for headaches. Negative for dizziness and light-headedness.  Psychiatric/Behavioral: Negative for dysphoric mood. The patient is not nervous/anxious.        Objective:   Vitals:   11/01/18 1520  BP: 120/88  Pulse: 88  Resp: 16  Temp: 97.9 F (36.6 C)  SpO2: 99%   BP Readings from Last 3 Encounters:  11/01/18 120/88  07/02/18 138/88  06/16/18 (!) 127/93   Wt Readings from Last 3 Encounters:  11/01/18 121 lb 6.4 oz (55.1 kg)  06/29/18 135 lb (61.2 kg)  06/16/18 136 lb 4 oz (61.8 kg)   Body mass index is 20.2 kg/m.   Physical Exam    Constitutional: She appears well-developed and well-nourished. No distress.  HENT:  Head: Normocephalic and atraumatic.  Right Ear: External ear normal. Normal ear canal and TM Left Ear: External ear normal.  Normal ear canal and TM Mouth/Throat: Oropharynx is clear and moist.  Eyes: Conjunctivae and EOM are normal.  Neck: Neck supple. No tracheal deviation present. No thyromegaly present.  No carotid bruit  Cardiovascular: Normal rate, regular rhythm and normal heart sounds.   No murmur heard.  No edema. Pulmonary/Chest: Effort normal and breath sounds normal. No respiratory distress. She has no wheezes. She has no rales.  Breast: deferred to Gyn Abdominal: Soft. She exhibits no distension. There is no tenderness.  Lymphadenopathy: She has no cervical adenopathy.  Skin: Skin is warm and dry. She is not diaphoretic.  Psychiatric: She has a normal mood and affect. Her behavior is normal.       Assessment & Plan:   Physical exam: Screening blood work   ordered Immunizations up-to-date Gyn   up-to-date Exercise   very active, no regular exercise Weight normal BMI Skin   no concerns Substance abuse    none     See Problem List for Assessment and Plan of chronic medical problems.    Follow-up annually

## 2018-11-01 ENCOUNTER — Other Ambulatory Visit (INDEPENDENT_AMBULATORY_CARE_PROVIDER_SITE_OTHER): Payer: 59

## 2018-11-01 ENCOUNTER — Ambulatory Visit (INDEPENDENT_AMBULATORY_CARE_PROVIDER_SITE_OTHER): Payer: 59 | Admitting: Internal Medicine

## 2018-11-01 ENCOUNTER — Encounter: Payer: Self-pay | Admitting: Internal Medicine

## 2018-11-01 VITALS — BP 120/88 | HR 88 | Temp 97.9°F | Resp 16 | Ht 65.0 in | Wt 121.4 lb

## 2018-11-01 DIAGNOSIS — Z8669 Personal history of other diseases of the nervous system and sense organs: Secondary | ICD-10-CM | POA: Diagnosis not present

## 2018-11-01 DIAGNOSIS — Z0000 Encounter for general adult medical examination without abnormal findings: Secondary | ICD-10-CM

## 2018-11-01 DIAGNOSIS — Z8759 Personal history of other complications of pregnancy, childbirth and the puerperium: Secondary | ICD-10-CM | POA: Diagnosis not present

## 2018-11-01 LAB — LIPID PANEL
CHOL/HDL RATIO: 2
Cholesterol: 196 mg/dL (ref 0–200)
HDL: 86.2 mg/dL (ref >39.00)
LDL CALC: 80 mg/dL (ref 0–99)
NONHDL: 110.06
Triglycerides: 149 mg/dL (ref 0.0–149.0)
VLDL: 29.8 mg/dL (ref 0.0–40.0)

## 2018-11-01 LAB — COMPREHENSIVE METABOLIC PANEL
ALT: 17 U/L (ref 0–35)
AST: 18 U/L (ref 0–37)
Albumin: 4.6 g/dL (ref 3.5–5.2)
Alkaline Phosphatase: 99 U/L (ref 39–117)
BUN: 21 mg/dL (ref 6–23)
CALCIUM: 9.8 mg/dL (ref 8.4–10.5)
CHLORIDE: 99 meq/L (ref 96–112)
CO2: 30 meq/L (ref 19–32)
Creatinine, Ser: 0.78 mg/dL (ref 0.40–1.20)
GFR: 101.75 mL/min (ref >60.00)
GLUCOSE: 93 mg/dL (ref 70–99)
POTASSIUM: 4.3 meq/L (ref 3.5–5.1)
Sodium: 137 mEq/L (ref 135–145)
Total Bilirubin: 0.3 mg/dL (ref 0.2–1.2)
Total Protein: 7.8 g/dL (ref 6.0–8.3)

## 2018-11-01 LAB — CBC WITH DIFFERENTIAL/PLATELET
Basophils Absolute: 0 10*3/uL (ref 0.0–0.1)
Basophils Relative: 0.7 % (ref 0.0–3.0)
EOS PCT: 8.2 % — AB (ref 0.0–5.0)
Eosinophils Absolute: 0.4 10*3/uL (ref 0.0–0.7)
HEMATOCRIT: 37 % (ref 36.0–46.0)
Hemoglobin: 12.1 g/dL (ref 12.0–15.0)
LYMPHS ABS: 1.6 10*3/uL (ref 0.7–4.0)
LYMPHS PCT: 35.5 % (ref 12.0–46.0)
MCHC: 32.7 g/dL (ref 30.0–36.0)
MCV: 79.6 fl (ref 78.0–100.0)
Monocytes Absolute: 0.5 10*3/uL (ref 0.1–1.0)
Monocytes Relative: 11.4 % (ref 3.0–12.0)
NEUTROS ABS: 2 10*3/uL (ref 1.4–7.7)
NEUTROS PCT: 44.2 % (ref 43.0–77.0)
PLATELETS: 278 10*3/uL (ref 150.0–400.0)
RBC: 4.65 Mil/uL (ref 3.87–5.11)
RDW: 13.7 % (ref 11.5–15.5)
WBC: 4.5 10*3/uL (ref 4.0–10.5)

## 2018-11-01 LAB — TSH: TSH: 0.86 u[IU]/mL (ref 0.35–4.50)

## 2018-11-01 NOTE — Assessment & Plan Note (Signed)
History of gestational hypertension with second pregnancy-third trimester through 6 weeks postpartum Was on nifedipine 60 mg daily Off medication and blood pressure has been well controlled at home-120s/80s She will continue to monitor CMP, TSH, CBC

## 2018-11-01 NOTE — Patient Instructions (Signed)
Tests ordered today. Your results will be released to MyChart (or called to you) after review, usually within 72hours after test completion. If any changes need to be made, you will be notified at that same time.  All other Health Maintenance issues reviewed.   All recommended immunizations and age-appropriate screenings are up-to-date or discussed.  No immunizations administered today.   Medications reviewed and updated.  Changes include :  none    Please followup in one year   Health Maintenance, Female Adopting a healthy lifestyle and getting preventive care can go a long way to promote health and wellness. Talk with your health care provider about what schedule of regular examinations is right for you. This is a good chance for you to check in with your provider about disease prevention and staying healthy. In between checkups, there are plenty of things you can do on your own. Experts have done a lot of research about which lifestyle changes and preventive measures are most likely to keep you healthy. Ask your health care provider for more information. Weight and diet Eat a healthy diet  Be sure to include plenty of vegetables, fruits, low-fat dairy products, and lean protein.  Do not eat a lot of foods high in solid fats, added sugars, or salt.  Get regular exercise. This is one of the most important things you can do for your health. ? Most adults should exercise for at least 150 minutes each week. The exercise should increase your heart rate and make you sweat (moderate-intensity exercise). ? Most adults should also do strengthening exercises at least twice a week. This is in addition to the moderate-intensity exercise. Maintain a healthy weight  Body mass index (BMI) is a measurement that can be used to identify possible weight problems. It estimates body fat based on height and weight. Your health care provider can help determine your BMI and help you achieve or maintain a  healthy weight.  For females 20 years of age and older: ? A BMI below 18.5 is considered underweight. ? A BMI of 18.5 to 24.9 is normal. ? A BMI of 25 to 29.9 is considered overweight. ? A BMI of 30 and above is considered obese. Watch levels of cholesterol and blood lipids  You should start having your blood tested for lipids and cholesterol at 35 years of age, then have this test every 5 years.  You may need to have your cholesterol levels checked more often if: ? Your lipid or cholesterol levels are high. ? You are older than 35 years of age. ? You are at high risk for heart disease. Cancer screening Lung Cancer  Lung cancer screening is recommended for adults 55-80 years old who are at high risk for lung cancer because of a history of smoking.  A yearly low-dose CT scan of the lungs is recommended for people who: ? Currently smoke. ? Have quit within the past 15 years. ? Have at least a 30-pack-year history of smoking. A pack year is smoking an average of one pack of cigarettes a day for 1 year.  Yearly screening should continue until it has been 15 years since you quit.  Yearly screening should stop if you develop a health problem that would prevent you from having lung cancer treatment. Breast Cancer  Practice breast self-awareness. This means understanding how your breasts normally appear and feel.  It also means doing regular breast self-exams. Let your health care provider know about any changes, no matter how small.    If you are in your 20s or 30s, you should have a clinical breast exam (CBE) by a health care provider every 1-3 years as part of a regular health exam.  If you are 40 or older, have a CBE every year. Also consider having a breast X-ray (mammogram) every year.  If you have a family history of breast cancer, talk to your health care provider about genetic screening.  If you are at high risk for breast cancer, talk to your health care provider about having  an MRI and a mammogram every year.  Breast cancer gene (BRCA) assessment is recommended for women who have family members with BRCA-related cancers. BRCA-related cancers include: ? Breast. ? Ovarian. ? Tubal. ? Peritoneal cancers.  Results of the assessment will determine the need for genetic counseling and BRCA1 and BRCA2 testing. Cervical Cancer Your health care provider may recommend that you be screened regularly for cancer of the pelvic organs (ovaries, uterus, and vagina). This screening involves a pelvic examination, including checking for microscopic changes to the surface of your cervix (Pap test). You may be encouraged to have this screening done every 3 years, beginning at age 21.  For women ages 30-65, health care providers may recommend pelvic exams and Pap testing every 3 years, or they may recommend the Pap and pelvic exam, combined with testing for human papilloma virus (HPV), every 5 years. Some types of HPV increase your risk of cervical cancer. Testing for HPV may also be done on women of any age with unclear Pap test results.  Other health care providers may not recommend any screening for nonpregnant women who are considered low risk for pelvic cancer and who do not have symptoms. Ask your health care provider if a screening pelvic exam is right for you.  If you have had past treatment for cervical cancer or a condition that could lead to cancer, you need Pap tests and screening for cancer for at least 20 years after your treatment. If Pap tests have been discontinued, your risk factors (such as having a new sexual partner) need to be reassessed to determine if screening should resume. Some women have medical problems that increase the chance of getting cervical cancer. In these cases, your health care provider may recommend more frequent screening and Pap tests. Colorectal Cancer  This type of cancer can be detected and often prevented.  Routine colorectal cancer screening  usually begins at 35 years of age and continues through 35 years of age.  Your health care provider may recommend screening at an earlier age if you have risk factors for colon cancer.  Your health care provider may also recommend using home test kits to check for hidden blood in the stool.  A small camera at the end of a tube can be used to examine your colon directly (sigmoidoscopy or colonoscopy). This is done to check for the earliest forms of colorectal cancer.  Routine screening usually begins at age 50.  Direct examination of the colon should be repeated every 5-10 years through 35 years of age. However, you may need to be screened more often if early forms of precancerous polyps or small growths are found. Skin Cancer  Check your skin from head to toe regularly.  Tell your health care provider about any new moles or changes in moles, especially if there is a change in a mole's shape or color.  Also tell your health care provider if you have a mole that is larger than the   size of a pencil eraser.  Always use sunscreen. Apply sunscreen liberally and repeatedly throughout the day.  Protect yourself by wearing long sleeves, pants, a wide-brimmed hat, and sunglasses whenever you are outside. Heart disease, diabetes, and high blood pressure  High blood pressure causes heart disease and increases the risk of stroke. High blood pressure is more likely to develop in: ? People who have blood pressure in the high end of the normal range (130-139/85-89 mm Hg). ? People who are overweight or obese. ? People who are African American.  If you are 18-39 years of age, have your blood pressure checked every 3-5 years. If you are 40 years of age or older, have your blood pressure checked every year. You should have your blood pressure measured twice-once when you are at a hospital or clinic, and once when you are not at a hospital or clinic. Record the average of the two measurements. To check your  blood pressure when you are not at a hospital or clinic, you can use: ? An automated blood pressure machine at a pharmacy. ? A home blood pressure monitor.  If you are between 55 years and 79 years old, ask your health care provider if you should take aspirin to prevent strokes.  Have regular diabetes screenings. This involves taking a blood sample to check your fasting blood sugar level. ? If you are at a normal weight and have a low risk for diabetes, have this test once every three years after 35 years of age. ? If you are overweight and have a high risk for diabetes, consider being tested at a younger age or more often. Preventing infection Hepatitis B  If you have a higher risk for hepatitis B, you should be screened for this virus. You are considered at high risk for hepatitis B if: ? You were born in a country where hepatitis B is common. Ask your health care provider which countries are considered high risk. ? Your parents were born in a high-risk country, and you have not been immunized against hepatitis B (hepatitis B vaccine). ? You have HIV or AIDS. ? You use needles to inject street drugs. ? You live with someone who has hepatitis B. ? You have had sex with someone who has hepatitis B. ? You get hemodialysis treatment. ? You take certain medicines for conditions, including cancer, organ transplantation, and autoimmune conditions. Hepatitis C  Blood testing is recommended for: ? Everyone born from 1945 through 1965. ? Anyone with known risk factors for hepatitis C. Sexually transmitted infections (STIs)  You should be screened for sexually transmitted infections (STIs) including gonorrhea and chlamydia if: ? You are sexually active and are younger than 35 years of age. ? You are older than 35 years of age and your health care provider tells you that you are at risk for this type of infection. ? Your sexual activity has changed since you were last screened and you are at an  increased risk for chlamydia or gonorrhea. Ask your health care provider if you are at risk.  If you do not have HIV, but are at risk, it may be recommended that you take a prescription medicine daily to prevent HIV infection. This is called pre-exposure prophylaxis (PrEP). You are considered at risk if: ? You are sexually active and do not regularly use condoms or know the HIV status of your partner(s). ? You take drugs by injection. ? You are sexually active with a partner who has HIV.   has HIV. Talk with your health care provider about whether you are at high risk of being infected with HIV. If you choose to begin PrEP, you should first be tested for HIV. You should then be tested every 3 months for as long as you are taking PrEP. Pregnancy  If you are premenopausal and you may become pregnant, ask your health care provider about preconception counseling.  If you may become pregnant, take 400 to 800 micrograms (mcg) of folic acid every day.  If you want to prevent pregnancy, talk to your health care provider about birth control (contraception). Osteoporosis and menopause  Osteoporosis is a disease in which the bones lose minerals and strength with aging. This can result in serious bone fractures. Your risk for osteoporosis can be identified using a bone density scan.  If you are 24 years of age or older, or if you are at risk for osteoporosis and fractures, ask your health care provider if you should be screened.  Ask your health care provider whether you should take a calcium or vitamin D supplement to lower your risk for osteoporosis.  Menopause may have certain physical symptoms and risks.  Hormone replacement therapy may reduce some of these symptoms and risks. Talk to your health care provider about whether hormone replacement therapy is right for you. Follow these instructions at home:  Schedule regular health, dental, and eye exams.  Stay current with your immunizations.  Do not use  any tobacco products including cigarettes, chewing tobacco, or electronic cigarettes.  If you are pregnant, do not drink alcohol.  If you are breastfeeding, limit how much and how often you drink alcohol.  Limit alcohol intake to no more than 1 drink per day for nonpregnant women. One drink equals 12 ounces of beer, 5 ounces of wine, or 1 ounces of hard liquor.  Do not use street drugs.  Do not share needles.  Ask your health care provider for help if you need support or information about quitting drugs.  Tell your health care provider if you often feel depressed.  Tell your health care provider if you have ever been abused or do not feel safe at home. This information is not intended to replace advice given to you by your health care provider. Make sure you discuss any questions you have with your health care provider. Document Released: 04/07/2011 Document Revised: 02/28/2016 Document Reviewed: 06/26/2015 Elsevier Interactive Patient Education  2019 [employer].

## 2018-11-01 NOTE — Assessment & Plan Note (Addendum)
Taking fioricet prn since breastfeeding Migraines frequent recently because she is not getting enough sleep because of her 2 kids and working Optometrist for now

## 2018-11-02 ENCOUNTER — Encounter: Payer: Self-pay | Admitting: Internal Medicine

## 2018-11-25 ENCOUNTER — Encounter: Payer: Self-pay | Admitting: Internal Medicine

## 2018-11-25 DIAGNOSIS — N6459 Other signs and symptoms in breast: Secondary | ICD-10-CM | POA: Diagnosis not present

## 2018-11-25 DIAGNOSIS — N6320 Unspecified lump in the left breast, unspecified quadrant: Secondary | ICD-10-CM | POA: Diagnosis not present

## 2018-11-26 MED ORDER — ALBUTEROL SULFATE HFA 108 (90 BASE) MCG/ACT IN AERS: 2.0000 | INHALATION_SPRAY | Freq: Four times a day (QID) | RESPIRATORY_TRACT | 0 refills | Status: DC | PRN

## 2019-03-01 DIAGNOSIS — Z681 Body mass index (BMI) 19 or less, adult: Secondary | ICD-10-CM | POA: Diagnosis not present

## 2019-03-01 DIAGNOSIS — Z01419 Encounter for gynecological examination (general) (routine) without abnormal findings: Secondary | ICD-10-CM | POA: Diagnosis not present

## 2019-03-01 DIAGNOSIS — Z124 Encounter for screening for malignant neoplasm of cervix: Secondary | ICD-10-CM | POA: Diagnosis not present

## 2019-03-10 ENCOUNTER — Encounter: Payer: Self-pay | Admitting: Internal Medicine

## 2019-03-10 DIAGNOSIS — R109 Unspecified abdominal pain: Secondary | ICD-10-CM

## 2019-03-10 DIAGNOSIS — R110 Nausea: Secondary | ICD-10-CM

## 2019-03-23 ENCOUNTER — Ambulatory Visit (INDEPENDENT_AMBULATORY_CARE_PROVIDER_SITE_OTHER): Payer: 59 | Admitting: Gastroenterology

## 2019-03-23 ENCOUNTER — Other Ambulatory Visit: Payer: Self-pay

## 2019-03-23 VITALS — Ht 65.0 in | Wt 121.0 lb

## 2019-03-23 DIAGNOSIS — R110 Nausea: Secondary | ICD-10-CM | POA: Insufficient documentation

## 2019-03-23 DIAGNOSIS — R1013 Epigastric pain: Secondary | ICD-10-CM

## 2019-03-23 DIAGNOSIS — K5909 Other constipation: Secondary | ICD-10-CM

## 2019-03-23 DIAGNOSIS — R0789 Other chest pain: Secondary | ICD-10-CM | POA: Diagnosis not present

## 2019-03-23 NOTE — Patient Instructions (Addendum)
Your provider has requested that you go to the basement level for labs in the next few days. Press "B" on the elevator. The lab is located at the first door on the left as you exit the elevator.  You have been scheduled for an abdominal ultrasound at [facility] (1st floor of hospital) on 03/28/19 at 11am. Please arrive 15 minutes prior to your appointment for registration. Make certain not to have anything to eat or drink 6 hours prior to your appointment. Should you need to reschedule your appointment, please contact radiology at [phone]. This test typically takes about 30 minutes to perform.  Normal BMI (Body Mass Index- based on height and weight) is between 19 and 25. Your BMI today is There is no height or weight on file to calculate BMI. Marland Kitchen Please consider follow up  regarding your BMI with your Primary Care Provider.  Thank you for entrusting me with your care and choosing [facility].  Dr Rush Landmark

## 2019-03-23 NOTE — Progress Notes (Signed)
[facility]ISIT   Primary Care Provider Binnie Rail, MD [place] [address] [phone]  Referring Provider Binnie Rail, MD [address] [place],  [place] [address] [phone]  Patient Profile: Jasmine Flores is a 33 y.o. female with a pmh significant for Asthma, Pituitary Adenoma (s/p resection), Migraines, Pregnancy-related GERD.  The patient presents to the [facility] for an evaluation and management of problem(s) noted below:  Problem List 1. Non-cardiac chest pain   2. Nausea   3. Epigastric pain   4. Chronic constipation     I connected with  Jasmine Flores on 03/23/19. I verified that I was speaking with the correct person using two identifiers. Due to the COVID-19 Pandemic, this service was provided via telemedicine using Audio/Visual Media. The patient was located at work. The provider was located in the office. The patient did consent to this visit and is aware of charges through their insurance as well as the limitations of evaluation and management by telemedicine. The patient was referred by PCP Other persons participating in this telemedicine service were none. Time spent on visit was 30 minutes in discussion with patient and in overall coordination of care.   History of Present Illness This is the patient's first visit to the outpatient [facility] clinic.  States that over the course of the last few months she has been having chest discomfort with an unclear etiology.  She recalls that during her pregnancy she had issues of chest pain as well as GERD.  She underwent cross-sectional imaging at the time as well as a cardiac evaluation that was unremarkable for an etiology of chest pain.  She did have reflux issues during her pregnancy but after she had her child she did not require any medications.  She is currently breast-feeding her child.  In the past she is tried a PPI but not  recently while she has been breast-feeding.  Chest discomfort is found in the midsternal region and does not radiate to her neck or her arms.  She does have nausea on a daily basis.  She does not take any antiemetics.  During her pregnancy she took Zofran without significant effect.  Compazine did not work either.  There was another medication that was very expensive and started with a B but she cannot recall that medication and it did not work.  Phenergan was the only medication that worked.  To her, the recurrent chest discomfort is different than when she was pregnant with GERD.  She does not have the burning sensation as frequently.  She denies any bloating.  She has bowel movements on an every other day or every 2-day basis and this is been longstanding.  She does have an abdominal discomfort in the midepigastrium at times and has not had formal imaging of her abdomen.  She does not take any laxatives.  Sometimes she recalls that her bowel movements look fatty in appearance and may float but not all the time.  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  She has never had an upper or lower endoscopy.  GI Review of Systems Positive as above Negative for dysphagia, odynophagia, vomiting, early satiety, melena, hematochezia  Review of Systems General: Denies fevers/chills/weight loss HEENT: Denies oral lesions Cardiovascular: As per HPI; denies palpitations Pulmonary: Denies shortness of breath/nocturnal cough Gastroenterological: See HPI Genitourinary: Denies darkened urine Hematological: Denies easy bruising/bleeding Endocrine: Denies temperature intolerance Dermatological: Denies jaundice Psychological: Mood is  stable   Medications Current Outpatient Medications  Medication Sig Dispense Refill   acetaminophen (TYLENOL) 500 MG tablet Take 1,000 mg by mouth every 8 (eight) hours as needed for mild pain or headache.     albuterol (PROVENTIL HFA;VENTOLIN HFA) 108 (90 Base) MCG/ACT inhaler  Inhale 2 puffs into the lungs every 6 (six) hours as needed for wheezing or shortness of breath. 1 Inhaler 0   Butalbital-APAP-Caffeine 50-300-40 MG CAPS Take 1 tablet by mouth every 4 (four) hours as needed (headaches).   2   Prenatal Vit-Fe Fumarate-FA (PRENATAL VITAMIN PO) Take 1 tablet by mouth every evening.      No current facility-administered medications for this visit.     Allergies Allergies  Allergen Reactions   Shellfish Allergy Itching, Swelling, Shortness Of Breath and Anaphylaxis   Sulfa Antibiotics Itching and Swelling    Histories Past Medical History:  Diagnosis Date   Alpha trait thalassemia    Anemia    Asthma    Headache    HSV infection    Migraines    Positive TB test    Pregnancy induced hypertension    Vaginal Pap smear, abnormal    Past Surgical History:  Procedure Laterality Date   PITUITARY SURGERY  2008   for adenoma   TONSILECTOMY, ADENOIDECTOMY, BILATERAL MYRINGOTOMY AND TUBES     TONSILLECTOMY     and adenoids   WISDOM TOOTH EXTRACTION     Social History   Socioeconomic History   Marital status: Single    Spouse name: Not on file   Number of children: Not on file   Years of education: Not on file   Highest education level: Not on file  Occupational History   Not on file  Social Needs   Financial resource strain: Not hard at all   Food insecurity    Worry: Never true    Inability: Never true   Transportation needs    Medical: No    Non-medical: Not on file  Tobacco Use   Smoking status: Never Smoker   Smokeless tobacco: Never Used  Substance and Sexual Activity   Alcohol use: No   Drug use: No   Sexual activity: Yes    Birth control/protection: None  Lifestyle   Physical activity    Days per week: 0 days    Minutes per session: 0 min   Stress: To some extent  Relationships   Social connections    Talks on phone: Not on file    Gets together: Not on file    Attends religious service:  Not on file    Active member of club or organization: Not on file    Attends meetings of clubs or organizations: Not on file    Relationship status: Not on file   Intimate partner violence    Fear of current or ex partner: No    Emotionally abused: No    Physically abused: No    Forced sexual activity: No  Other Topics Concern   Not on file  Social History Narrative   Works at [employer]   20 months old    Family History  Problem Relation Age of Onset   Diabetes Mother    Hypertension Mother    Diabetes Father    Hypertension Father    Stroke Father    Hypertension Maternal Grandmother    Diabetes Maternal Grandmother    Hypertension Maternal Grandfather    Diabetes Maternal Grandfather    Colon cancer Neg Hx  Esophageal cancer Neg Hx    Inflammatory bowel disease Neg Hx    Liver disease Neg Hx    Pancreatic cancer Neg Hx    Stomach cancer Neg Hx    Rectal cancer Neg Hx    I have reviewed her medical, social, and family history in detail and updated the electronic medical record as necessary.    PHYSICAL EXAMINATION  Telehealth visit   REVIEW OF DATA  I reviewed the following data at the time of this encounter:  GI Procedures and Studies  No relevant studies to review  Laboratory Studies  Reviewed in epic  Imaging Studies  No relevant studies to review   ASSESSMENT  Ms. Jasmine Flores is a 33 y.o. female with a pmh significant for Asthma, Pituitary Adenoma (s/p resection), Migraines, Pregnancy-related GERD.  The patient is seen today for evaluation and management of:  1. Non-cardiac chest pain   2. Nausea   3. Epigastric pain   4. Chronic constipation    The patient is hemodynamically stable.  From a clinical perspective the etiology of her noncardiac chest discomfort remains unclear.  With that being said she may have underlying GERD or NERD.  I do think that further evaluation will be reasonable.  Would like to begin this work-up with serological  work-up as well as abdominal imaging.  This does not sound to be biliary pathology but worthwhile to exonerate.  I discussed with the patient that we may consider the role of a diagnostic endoscopy at some point in the future.  Would also like to consider the use of a PPI trial for the patient.  From a breast-feeding perspective there are not great studies for most of the PPIs in regards to potential transmission through breast milk however omeprazole and Protonix have been utilized in the past and it may be worthwhile for [place] to consider this in the future if we do not find a source or etiology of things based on our work-up so far.  Cannot rule out H. pylori yet and so we will plan to look for that as well.  The patient states that she will be breast-feeding for the next few weeks to months but may not be continuing on a long-term basis.  As such we may be able to use a PPI trial something to do in the future.  I offered the patient a prescription for Phenergan but she would like to hold on that at this point in time.  Her bowel habits are longstanding in nature of chronic constipation I will rule out a few etiologies with serological work-up as well.  Not clear that a diagnostic colonoscopy is required as of yet but may need to consider this in regards to her abdominal pain work-up if work-up is unremarkable.  Nominal ultrasound imaging is unremarkable may consider the role of a CT scan but would probably consider diagnostic endoscopic evaluation around that same time.  All patient questions were answered, to the best of my ability, and the patient agrees to the aforementioned plan of action with follow-up as indicated.   PLAN  Laboratories as outlined below H. pylori stool antigen Consider PPI trial in near future (concern in setting of breast-feeding but they have been used in patients during breast-feeding as well-omeprazole/Protonix) We will consider diagnostic upper endoscopy and possible diagnostic  colonoscopy in future Holding on CT scan for now but may consider in future Holding on antiemetic therapy as per patient desire currently   Orders Placed This Encounter  Procedures   US Abdomen Complete   Comprehensive metabolic panel   TSH   Cortisol   High sensitivity CRP   Tissue transglutaminase, IgA   IgA   Helicobacter pylori antigen det, stool    New Prescriptions   No medications on file   Modified Medications   No medications on file    Planned Follow Up No follow-ups on file.   Justice Britain, MD [facility] Office # [phone]

## 2019-03-24 ENCOUNTER — Other Ambulatory Visit (INDEPENDENT_AMBULATORY_CARE_PROVIDER_SITE_OTHER): Payer: 59

## 2019-03-24 DIAGNOSIS — R0789 Other chest pain: Secondary | ICD-10-CM | POA: Diagnosis not present

## 2019-03-24 DIAGNOSIS — R110 Nausea: Secondary | ICD-10-CM | POA: Diagnosis not present

## 2019-03-24 DIAGNOSIS — R109 Unspecified abdominal pain: Secondary | ICD-10-CM

## 2019-03-24 LAB — TSH: TSH: 0.66 u[IU]/mL (ref 0.35–4.50)

## 2019-03-24 LAB — COMPREHENSIVE METABOLIC PANEL
ALT: 10 U/L (ref 0–35)
AST: 13 U/L (ref 0–37)
Albumin: 4.5 g/dL (ref 3.5–5.2)
Alkaline Phosphatase: 68 U/L (ref 39–117)
BUN: 24 mg/dL — ABNORMAL HIGH (ref 6–23)
CO2: 26 mEq/L (ref 19–32)
Calcium: 9.6 mg/dL (ref 8.4–10.5)
Chloride: 102 mEq/L (ref 96–112)
Creatinine, Ser: 0.73 mg/dL (ref 0.40–1.20)
GFR: 109.58 mL/min (ref >60.00)
Glucose, Bld: 84 mg/dL (ref 70–99)
Potassium: 4.1 mEq/L (ref 3.5–5.1)
Sodium: 140 mEq/L (ref 135–145)
Total Bilirubin: 0.6 mg/dL (ref 0.2–1.2)
Total Protein: 7.6 g/dL (ref 6.0–8.3)

## 2019-03-24 LAB — IGA: IgA: 273 mg/dL (ref 68–378)

## 2019-03-24 LAB — HIGH SENSITIVITY CRP: CRP, High Sensitivity: 0.86 mg/L (ref 0.000–5.000)

## 2019-03-24 LAB — CORTISOL: Cortisol, Plasma: 10.3 ug/dL

## 2019-03-25 LAB — TISSUE TRANSGLUTAMINASE, IGA: (tTG) Ab, IgA: 1 U/mL

## 2019-03-26 ENCOUNTER — Encounter: Payer: Self-pay | Admitting: Gastroenterology

## 2019-03-26 DIAGNOSIS — R1013 Epigastric pain: Secondary | ICD-10-CM | POA: Insufficient documentation

## 2019-03-26 DIAGNOSIS — K5909 Other constipation: Secondary | ICD-10-CM | POA: Insufficient documentation

## 2019-03-26 DIAGNOSIS — R0789 Other chest pain: Secondary | ICD-10-CM | POA: Insufficient documentation

## 2019-03-28 ENCOUNTER — Ambulatory Visit (HOSPITAL_COMMUNITY)
Admission: RE | Admit: 2019-03-28 | Discharge: 2019-03-28 | Disposition: A | Discharge status: Home / Self Care | Payer: 59 | Source: Ambulatory Visit | Attending: Gastroenterology | Admitting: Gastroenterology

## 2019-03-28 ENCOUNTER — Other Ambulatory Visit: Payer: 59

## 2019-03-28 ENCOUNTER — Other Ambulatory Visit: Payer: Self-pay

## 2019-03-28 DIAGNOSIS — R110 Nausea: Secondary | ICD-10-CM | POA: Diagnosis not present

## 2019-03-28 DIAGNOSIS — R0789 Other chest pain: Secondary | ICD-10-CM | POA: Diagnosis not present

## 2019-03-28 DIAGNOSIS — R1013 Epigastric pain: Secondary | ICD-10-CM

## 2019-03-28 DIAGNOSIS — R079 Chest pain, unspecified: Secondary | ICD-10-CM

## 2019-03-29 ENCOUNTER — Telehealth: Payer: Self-pay

## 2019-03-29 DIAGNOSIS — R110 Nausea: Secondary | ICD-10-CM

## 2019-03-29 DIAGNOSIS — R935 Abnormal findings on diagnostic imaging of other abdominal regions, including retroperitoneum: Secondary | ICD-10-CM

## 2019-03-29 DIAGNOSIS — R1013 Epigastric pain: Secondary | ICD-10-CM

## 2019-03-29 DIAGNOSIS — K5909 Other constipation: Secondary | ICD-10-CM

## 2019-03-29 LAB — HELICOBACTER PYLORI  SPECIAL ANTIGEN
MICRO NUMBER:: 593490
SPECIMEN QUALITY: ADEQUATE

## 2019-03-29 NOTE — Telephone Encounter (Signed)
I just called and spoke with the patient about the results of the abdominal ultrasound. Slight prominence unlikely to be an issue that is causing her problems currently however due to the findings I think it is reasonable to pursue further cross-sectional imaging as per radiology thoughts as well. I would like to do an MRI/MRCP of the abdomen. If issues come up in regards to ordering this then can consider a contrasted CT abdomen pancreas protocol however MRI would be most ideal to evaluate the ducts. The patient is aware of the results and appreciative for the call back. Also let her know that her H. pylori stool antigen is still pending.

## 2019-03-29 NOTE — Telephone Encounter (Signed)
Recd call from Opal Sidles at [facility] @ 8:59am.  Prominence of Pancreatic Duct. Recommendations of further evaluation with MRI or CT scan w/ and w/o contrast of pancreas.

## 2019-03-30 NOTE — Telephone Encounter (Signed)
You have been scheduled for an MRI/MRCP on 04/11/2019 at [facility]. Your appointment time is 9:00am Please arrive 30 minutes prior to your appointment time for registration purposes. Please make certain not to have anything to eat or drink 4 hours prior to your test. In addition, if you have any metal in your body, have a pacemaker or defibrillator, please be sure to let your ordering physician know. This test typically takes 45 minutes to 1 hour to complete. Should you need to reschedule, please call [phone] to do so.  Pt has been informed. Verbalized understanding of instructions.

## 2019-04-11 ENCOUNTER — Ambulatory Visit (HOSPITAL_COMMUNITY)
Admission: RE | Admit: 2019-04-11 | Discharge: 2019-04-11 | Disposition: A | Discharge status: Home / Self Care | Payer: 59 | Source: Ambulatory Visit | Attending: Gastroenterology | Admitting: Gastroenterology

## 2019-04-11 ENCOUNTER — Other Ambulatory Visit: Payer: Self-pay

## 2019-04-11 ENCOUNTER — Other Ambulatory Visit: Payer: Self-pay | Admitting: Gastroenterology

## 2019-04-11 DIAGNOSIS — R1013 Epigastric pain: Secondary | ICD-10-CM | POA: Insufficient documentation

## 2019-04-11 DIAGNOSIS — K5909 Other constipation: Secondary | ICD-10-CM

## 2019-04-11 DIAGNOSIS — R110 Nausea: Secondary | ICD-10-CM | POA: Insufficient documentation

## 2019-04-11 DIAGNOSIS — R935 Abnormal findings on diagnostic imaging of other abdominal regions, including retroperitoneum: Secondary | ICD-10-CM | POA: Diagnosis not present

## 2019-04-11 DIAGNOSIS — R1010 Upper abdominal pain, unspecified: Secondary | ICD-10-CM | POA: Diagnosis not present

## 2019-04-11 MED ORDER — GADOBUTROL 1 MMOL/ML IV SOLN
5.0000 mL | Freq: Once | INTRAVENOUS | Status: AC | PRN
  Administered 2019-04-11: 5 mL via INTRAVENOUS

## 2019-04-19 ENCOUNTER — Encounter: Payer: Self-pay | Admitting: Gastroenterology

## 2019-04-19 ENCOUNTER — Ambulatory Visit (INDEPENDENT_AMBULATORY_CARE_PROVIDER_SITE_OTHER): Payer: 59 | Admitting: Gastroenterology

## 2019-04-19 DIAGNOSIS — R935 Abnormal findings on diagnostic imaging of other abdominal regions, including retroperitoneum: Secondary | ICD-10-CM | POA: Diagnosis not present

## 2019-04-19 DIAGNOSIS — R0789 Other chest pain: Secondary | ICD-10-CM | POA: Diagnosis not present

## 2019-04-19 DIAGNOSIS — K5909 Other constipation: Secondary | ICD-10-CM | POA: Diagnosis not present

## 2019-04-19 DIAGNOSIS — R9389 Abnormal findings on diagnostic imaging of other specified body structures: Secondary | ICD-10-CM | POA: Insufficient documentation

## 2019-04-19 DIAGNOSIS — K219 Gastro-esophageal reflux disease without esophagitis: Secondary | ICD-10-CM | POA: Diagnosis not present

## 2019-04-19 MED ORDER — PANTOPRAZOLE SODIUM 40 MG PO TBEC: 40.0000 mg | DELAYED_RELEASE_TABLET | Freq: Every day | ORAL | 2 refills | Status: DC

## 2019-04-19 NOTE — Patient Instructions (Signed)
We have sent the following medications to your pharmacy for you to pick up at your convenience: Protonix   Please send [place] a mychart message in about 4 weeks to let us know how you are doing.   Thank you for choosing me and [facility].  Dr. Rush Landmark

## 2019-04-19 NOTE — Progress Notes (Signed)
[place] VISIT   Primary Care Provider Binnie Rail, MD [place] [address] [phone]  Patient Profile: Jasmine Flores is a 33 y.o. female with a pmh significant for Asthma, Pituitary Adenoma (s/p resection), Migraines, Pregnancy-related GERD.  The patient presents to the [facility] for an evaluation and management of problem(s) noted below:  Problem List 1. Non-cardiac chest pain   2. Gastroesophageal reflux disease, esophagitis presence not specified   3. Abnormal findings on diagnostic imaging of abdomen   4. Chronic constipation     I connected with  Jasmine Flores on 04/19/19. I verified that I was speaking with the correct person using two identifiers. Due to the COVID-19 Pandemic, this service was provided via telemedicine using audio/visual media. The patient was located at home. The provider was located in the office. The patient did consent to this visit and is aware of charges through their insurance as well as the limitations of evaluation and management by telemedicine. Other persons participating in this telemedicine service were none. Time spent on visit was 18 minutes in review of MRI and discussion with patient and coordination of care.   History of Present Illness Please see initial consultation note for full details of HPI.    Interval History Patient has undergone imaging as noted below.  Otherwise her noncardiac chest discomfort has begun to feel more like acid reflux.  Last Friday into the weekend she had her more typical GI reflux symptoms which were present previously when she was pregnant.  The symptoms are slightly better since the weekend but still mildly present.  She has stopped breast-feeding and is planning to just proceed with formula moving forward.  No dysphagia.  No odynophagia.  Abdominal upset not significantly different from peer previous.  She is not having any  significant vomiting but has nausea on occasion.  She is wondering about the results of her pancreas and the concern for atrophy versus recent local and inflammation in the region.  No prior history of pancreatitis in her history.  No family history of pancreas issues.  Not taking significant nonsteroidals.  Stress levels are stable.  She remains constipated using the restroom every 2 to 4 days most often 3 days.  No hematochezia or melena.  GI Review of Systems Positive as above Negative for weight loss, early satiety  Review of Systems General: Denies fevers/chills Cardiovascular: Denies current chest pain or palpitations Pulmonary: Denies shortness of breath/nocturnal cough Gastroenterological: See HPI Genitourinary: Denies darkened urine Hematological: Denies easy bruising/bleeding Endocrine: Denies temperature intolerance Dermatological: Denies jaundice Psychological: Mood is stable   Medications Current Outpatient Medications  Medication Sig Dispense Refill   acetaminophen (TYLENOL) 500 MG tablet Take 1,000 mg by mouth every 8 (eight) hours as needed for mild pain or headache.     albuterol (PROVENTIL HFA;VENTOLIN HFA) 108 (90 Base) MCG/ACT inhaler Inhale 2 puffs into the lungs every 6 (six) hours as needed for wheezing or shortness of breath. 1 Inhaler 0   Butalbital-APAP-Caffeine 50-300-40 MG CAPS Take 1 tablet by mouth every 4 (four) hours as needed (headaches).   2   pantoprazole (PROTONIX) 40 MG tablet Take 1 tablet (40 mg total) by mouth daily. 90 tablet 2   Prenatal Vit-Fe Fumarate-FA (PRENATAL VITAMIN PO) Take 1 tablet by mouth every evening.      No current facility-administered medications for this visit.     Allergies Allergies  Allergen Reactions   Shellfish Allergy Itching, Swelling,  Shortness Of Breath and Anaphylaxis   Sulfa Antibiotics Itching and Swelling    Histories Past Medical History:  Diagnosis Date   Alpha trait thalassemia    Anemia      Asthma    Headache    HSV infection    Migraines    Positive TB test    Pregnancy induced hypertension    Vaginal Pap smear, abnormal    Past Surgical History:  Procedure Laterality Date   PITUITARY SURGERY  2008   for adenoma   TONSILECTOMY, ADENOIDECTOMY, BILATERAL MYRINGOTOMY AND TUBES     TONSILLECTOMY     and adenoids   WISDOM TOOTH EXTRACTION     Social History   Socioeconomic History   Marital status: Single    Spouse name: Not on file   Number of children: Not on file   Years of education: Not on file   Highest education level: Not on file  Occupational History   Not on file  Social Needs   Financial resource strain: Not hard at all   Food insecurity    Worry: Never true    Inability: Never true   Transportation needs    Medical: No    Non-medical: Not on file  Tobacco Use   Smoking status: Never Smoker   Smokeless tobacco: Never Used  Substance and Sexual Activity   Alcohol use: No   Drug use: No   Sexual activity: Yes    Birth control/protection: None  Lifestyle   Physical activity    Days per week: 0 days    Minutes per session: 0 min   Stress: To some extent  Relationships   Social connections    Talks on phone: Not on file    Gets together: Not on file    Attends religious service: Not on file    Active member of club or organization: Not on file    Attends meetings of clubs or organizations: Not on file    Relationship status: Not on file   Intimate partner violence    Fear of current or ex partner: No    Emotionally abused: No    Physically abused: No    Forced sexual activity: No  Other Topics Concern   Not on file  Social History Narrative   Works at [employer]   66 months old    Family History  Problem Relation Age of Onset   Diabetes Mother    Hypertension Mother    Diabetes Father    Hypertension Father    Stroke Father    Hypertension Maternal Grandmother    Diabetes Maternal Grandmother     Hypertension Maternal Grandfather    Diabetes Maternal Grandfather    Colon cancer Neg Hx    Esophageal cancer Neg Hx    Inflammatory bowel disease Neg Hx    Liver disease Neg Hx    Pancreatic cancer Neg Hx    Stomach cancer Neg Hx    Rectal cancer Neg Hx    I have reviewed her medical, social, and family history in detail and updated the electronic medical record as necessary.    PHYSICAL EXAMINATION  Telehealth visit   REVIEW OF DATA  I reviewed the following data at the time of this encounter:  GI Procedures and Studies  No relevant studies to review  Laboratory Studies  Reviewed in epic  Imaging Studies  MRICP IMPRESSION: 1. No acute abdominal findings, mass lesions or adenopathy. 2. Mild parenchymal thinning of the pancreas at  the head neck junction region may be normal variant or possibly due to remote inflammation. Normal caliber and course of the main pancreatic duct.   ASSESSMENT  Ms. Jasmine Flores is a 33 y.o. female with a pmh significant for Asthma, Pituitary Adenoma (s/p resection), Migraines, Pregnancy-related GERD.  The patient is seen today for evaluation and management of:  1. Non-cardiac chest pain   2. Gastroesophageal reflux disease, esophagitis presence not specified   3. Abnormal findings on diagnostic imaging of abdomen   4. Chronic constipation    Overall, I still believe that this patient most likely has underlying GERD or nerd.  Based on her symptoms being more typical of her prior GERD I think it is worthwhile for [place] to consider a PPI trial.  As she is no longer breast-feeding our choice of PPIs becomes a little bit easier.  She previously used Protonix with good effect.  We will plan to start her on a moderate dose PPI over the course the next 3 to 4 weeks and she will MyChart [place] based on her symptoms at that point in time.  If things have abated significantly then we will transition her to once daily PPI and monitor symptoms.  If symptoms  do not improve she will increase to 40 mg twice daily and then be set up for an upper endoscopy to evaluate for eosinophilic/lymphocytic esophagitis as well as overt acid reflux esophagitis.  She would be ruled out for other etiologies of chronic gastritis, does not look to be H. pylori based on her most recent stool antigen.  Would benefit from also celiac disease rule out though she has issues with constipation rather than diarrhea.  We will start her on a fiber supplement to try and optimize her stool output and she can also initiate MiraLAX on a daily basis while maintaining good oral fluid intake.  We will follow-up formally in 8 weeks in clinic if she is doing well on PPI dosing if not we will plan for the diagnostic upper endoscopy.  No role for colonoscopy currently.  We discussed briefly the findings of the pancreas as well as the previous prominence noted on ultrasound imaging over the PD.  This not as apparent on her MRI/MRCP.  We discussed that if the patient ends up needing to undergo an endoscopy that she may benefit from moving forward with an ultrasound endoscopy as well to ensure that the pancreas looks okay and we can decide on that based on how she is doing overall.  Otherwise, without a prior history of pancreatitis and without any evidence of significant ductal dilation proximal to the area of "atrophy" I am not as concerned about the overall likelihood of an malignancy or lesion but it is something that we will need to consider and monitor over time.  All patient questions were answered, to the best of my ability, and the patient agrees to the aforementioned plan of action with follow-up as indicated.   PLAN  Begin Protonix 40 mg once daily In 4 weeks patient will MyChart [place] how she is doing -If improvement will maintain for at least 4 to 8 weeks of once daily dosing -If no improvement will increase to 40 mg twice daily and then proceed with a follow-up We will consider role of  diagnostic endoscopy for evaluation We will consider role of addition of endoscopic ultrasound to evaluate pancreas at time of upper endoscopy if indicated Fiber supplementation with FiberCon once daily Initiate MiraLAX once daily up to 2  times daily and monitor symptoms We will consider medications such as Linzess or true Lance in future    No orders of the defined types were placed in this encounter.   New Prescriptions   PANTOPRAZOLE (PROTONIX) 40 MG TABLET    Take 1 tablet (40 mg total) by mouth daily.   Modified Medications   No medications on file    Planned Follow Up No follow-ups on file.   Justice Britain, MD [facility] Advanced Endoscopy Office # [phone]

## 2019-05-10 ENCOUNTER — Other Ambulatory Visit: Payer: Self-pay | Admitting: Internal Medicine

## 2019-05-11 ENCOUNTER — Telehealth: Payer: Self-pay | Admitting: Gastroenterology

## 2019-05-11 NOTE — Telephone Encounter (Signed)
Pt reported that she has been on pantoprazole and is still having the same symptoms.

## 2019-05-11 NOTE — Telephone Encounter (Signed)
Returned pt call. Pt states that her symptoms have not improved since her last office visit. Pt has been taking pantoprazole once daily. Pt was advised today to increase to BID as per your last visit note and scheduled for follow-up office, however the first available appointment isn't until Sept. Also it looks like a nurse forwarded a mychart message to you on 05/06/2019 , but you where at the hospital and then off the next two days. I explained to the patient why there was a delay in the response and she states that she understands.

## 2019-05-12 ENCOUNTER — Telehealth: Payer: Self-pay

## 2019-05-12 MED ORDER — PANTOPRAZOLE SODIUM 40 MG PO TBEC: 40.0000 mg | DELAYED_RELEASE_TABLET | Freq: Two times a day (BID) | ORAL | 3 refills | Status: DC

## 2019-05-12 NOTE — Telephone Encounter (Signed)
I have communicated with patient via MyChart since my return. Thanks. GM

## 2019-05-12 NOTE — Telephone Encounter (Signed)
-----   Message from Irving Copas., MD sent at 05/12/2019  9:29 AM EDT ----- Regarding: EGD Need Jeremey Bascom, Please see my MyChart messaging with patient. Proceed with increased dosing of PPI to 40 BID and also lets try and get her on the books for an EGD in the next few weeks. Thanks. GM

## 2019-05-12 NOTE — Telephone Encounter (Signed)
Sent a message to the pt to call for an EGD in the [facility] and also to notify her pharmacy if she needs refills.  She will increase to BID dose.  I have changed that for her in Epic and new prescription sent.

## 2019-05-23 ENCOUNTER — Encounter: Payer: Self-pay | Admitting: Gastroenterology

## 2019-06-06 ENCOUNTER — Encounter: Payer: Self-pay | Admitting: Internal Medicine

## 2019-06-07 ENCOUNTER — Ambulatory Visit: Payer: Self-pay | Admitting: *Deleted

## 2019-06-07 ENCOUNTER — Other Ambulatory Visit: Payer: Self-pay

## 2019-06-07 ENCOUNTER — Emergency Department (HOSPITAL_COMMUNITY): Payer: 59

## 2019-06-07 ENCOUNTER — Encounter (HOSPITAL_COMMUNITY): Payer: Self-pay | Admitting: Emergency Medicine

## 2019-06-07 ENCOUNTER — Emergency Department (HOSPITAL_COMMUNITY)
Admission: EM | Admit: 2019-06-07 | Discharge: 2019-06-07 | Disposition: A | Discharge status: Home / Self Care | Payer: 59 | Attending: Emergency Medicine | Admitting: Emergency Medicine

## 2019-06-07 DIAGNOSIS — J45909 Unspecified asthma, uncomplicated: Secondary | ICD-10-CM | POA: Insufficient documentation

## 2019-06-07 DIAGNOSIS — B20 Human immunodeficiency virus [HIV] disease: Secondary | ICD-10-CM | POA: Insufficient documentation

## 2019-06-07 DIAGNOSIS — R079 Chest pain, unspecified: Secondary | ICD-10-CM | POA: Diagnosis not present

## 2019-06-07 LAB — CBC
HCT: 44.9 % (ref 36.0–46.0)
Hemoglobin: 14.2 g/dL (ref 12.0–15.0)
MCH: 25.5 pg — ABNORMAL LOW (ref 26.0–34.0)
MCHC: 31.6 g/dL (ref 30.0–36.0)
MCV: 80.6 fL (ref 80.0–100.0)
Platelets: 330 10*3/uL (ref 150–400)
RBC: 5.57 MIL/uL — ABNORMAL HIGH (ref 3.87–5.11)
RDW: 13.2 % (ref 11.5–15.5)
WBC: 4.1 10*3/uL (ref 4.0–10.5)
nRBC: 0 % (ref 0.0–0.2)

## 2019-06-07 LAB — I-STAT BETA HCG BLOOD, ED (NOT ORDERABLE): I-stat hCG, quantitative: <5 m[IU]/mL (ref <5)

## 2019-06-07 LAB — BASIC METABOLIC PANEL
Anion gap: 14 (ref 5–15)
BUN: 16 mg/dL (ref 6–20)
CO2: 21 mmol/L — ABNORMAL LOW (ref 22–32)
Calcium: 9.6 mg/dL (ref 8.9–10.3)
Chloride: 104 mmol/L (ref 98–111)
Creatinine, Ser: 0.64 mg/dL (ref 0.44–1.00)
GFR calc Af Amer: >60 mL/min (ref >60)
GFR calc non Af Amer: >60 mL/min (ref >60)
Glucose, Bld: 81 mg/dL (ref 70–99)
Potassium: 4.2 mmol/L (ref 3.5–5.1)
Sodium: 139 mmol/L (ref 135–145)

## 2019-06-07 LAB — TROPONIN I (HIGH SENSITIVITY): Troponin I (High Sensitivity): <2 ng/L (ref <18)

## 2019-06-07 MED ORDER — SUCRALFATE 1 G PO TABS: 1.0000 g | ORAL_TABLET | Freq: Four times a day (QID) | ORAL | 0 refills | Status: DC | PRN

## 2019-06-07 NOTE — ED Triage Notes (Signed)
Pt c/o sternal pains that radiate to back since Friday and fatigue since yesterday.

## 2019-06-07 NOTE — Discharge Instructions (Addendum)
You were evaluated in the Emergency Department and after careful evaluation, we did not find any emergent condition requiring admission or further testing in the hospital.  Your exam/testing today was overall reassuring.  You can use the Carafate medication as needed for pain.  Please return to the Emergency Department if you experience any worsening of your condition.  We encourage you to follow up with a primary care provider.  Thank you for allowing [place] to be a part of your care.

## 2019-06-07 NOTE — Telephone Encounter (Signed)
Patient states she has on/off chest pain. She is working with GI. Patient states her fatigue is worse- she feels she needs inhaler more.No appetite today.  Per chest pain protocol- advised ED  Reason for Disposition . [1] Chest pain lasts > 5 minutes AND [2] occurred in past 3 days (72 hours)  Answer Assessment - Initial Assessment Questions 1. DESCRIPTION: "Describe how you are feeling."     Fatigued and tired- patient states the chest pain has been constant since Monday 2. SEVERITY: "How bad is it?"  "Can you stand and walk?"   - MILD - Feels weak or tired, but does not interfere with work, school or normal activities   - [place] to stand and walk; weakness interferes with work, school, or normal activities   - SEVERE - Unable to stand or walk     moderate 3. ONSET:  "When did the weakness begin?"     Saturday was the worst day- she really noticed a change 4. CAUSE: "What do you think is causing the weakness?"     Unknown- overall not feeling well 5. MEDICINES: "Have you recently started a new medicine or had a change in the amount of a medicine?"     Protonix is new  6. OTHER SYMPTOMS: "Do you have any other symptoms?" (e.g., chest pain, fever, cough, SOB, vomiting, diarrhea, bleeding, other areas of pain)     Chest pain, not hungry this morning,feels she has to take deeper breaths 7. PREGNANCY: "Is there any chance you are pregnant?" "When was your last menstrual period?"     No- LMP-due next week- using condoms for birth control  Answer Assessment - Initial Assessment Questions 1. LOCATION: "Where does it hurt?"       midsternum 2. RADIATION: "Does the pain go anywhere else?" (e.g., into neck, jaw, arms, back)     Upper back 3. ONSET: "When did the chest pain begin?" (Minutes, hours or days)      Friday- same pain as she is seeing GI 4. PATTERN "Does the pain come and go, or has it been constant since it started?"  "Does it get worse with exertion?"      Constant since  Monday 5. DURATION: "How long does it last" (e.g., seconds, minutes, hours)     hours 6. SEVERITY: "How bad is the pain?"  (e.g., Scale 1-10; mild, moderate, or severe)    - MILD (1-3): doesn't interfere with normal activities     - MODERATE (4-7): interferes with normal activities or awakens from sleep    - SEVERE (8-10): excruciating pain, unable to do any normal activities       moderate 7. CARDIAC RISK FACTORS: "Do you have any history of heart problems or risk factors for heart disease?" (e.g., angina, prior heart attack; diabetes, high blood pressure, high cholesterol, smoker, or strong family history of heart disease)     2018- patient was cleared of heart problems 8. PULMONARY RISK FACTORS: "Do you have any history of lung disease?"  (e.g., blood clots in lung, asthma, emphysema, birth control pills)     asthma 9. CAUSE: "What do you think is causing the chest pain?"     unknown 10. OTHER SYMPTOMS: "Do you have any other symptoms?" (e.g., dizziness, nausea, vomiting, sweating, fever, difficulty breathing, cough)       Fatigue, dizzy last night 11. PREGNANCY: "Is there any chance you are pregnant?" "When was your last menstrual period?"       no  Protocols used: CHEST PAIN-A-AH, WEAKNESS (GENERALIZED) AND FATIGUE-A-AH

## 2019-06-07 NOTE — ED Provider Notes (Signed)
[facility] Emergency Department Provider Note MRN:  [id number]  Arrival date & time: 06/07/19     Chief Complaint   Chest Pain and Fatigue   History of Present Illness   Jasmine Flores is a 33 y.o. year-old female with a history of asthma presenting to the ED with chief complaint of chest pain.  Patient has had chest pain ongoing for years, seems to be increasing in frequency and severity over the past several days.  Also endorsing decreased energy.  She denies dizziness or sweatiness, no shortness of breath, no nausea or vomiting, no abdominal pain, no dysuria, no numbness or weakness to the arms or legs.  Symptoms are moderate, constant, improved with laying flat.  Review of Systems  A complete 10 system review of systems was obtained and all systems are negative except as noted in the HPI and PMH.   Patient's Health History    Past Medical History:  Diagnosis Date  . Alpha trait thalassemia   . Anemia   . Asthma   . Headache   . HSV infection   . Migraines   . Positive TB test   . Pregnancy induced hypertension   . Vaginal Pap smear, abnormal     Past Surgical History:  Procedure Laterality Date  . 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 TONSILECTOMY, ADENOIDECTOMY, BILATERAL MYRINGOTOMY AND TUBES    . TONSILLECTOMY     and adenoids  . WISDOM TOOTH EXTRACTION      Family History  Problem Relation Age of Onset  . Diabetes Mother   . Hypertension Mother   . Diabetes Father   . Hypertension Father   . Stroke Father   . Hypertension Maternal Grandmother   . Diabetes Maternal Grandmother   . Hypertension Maternal Grandfather   . Diabetes Maternal Grandfather   . Colon cancer Neg Hx   . Esophageal cancer Neg Hx   . Inflammatory bowel disease Neg Hx   . Liver disease Neg Hx   . Pancreatic cancer Neg Hx   . Stomach cancer Neg Hx   . Rectal cancer Neg Hx     Social History   Socioeconomic History  . Marital status: Single   Spouse name: Not on file  . Number of children: Not on file  . Years of education: Not on file  . Highest education level: Not on file  Occupational History  . Not on file  Social Needs  . Financial resource strain: Not hard at all  . Food insecurity    Worry: Never true    Inability: Never true  . Transportation needs    Medical: No    Non-medical: Not on file  Tobacco Use  . Smoking status: Never Smoker  . Smokeless tobacco: Never Used  Substance and Sexual Activity  . Alcohol use: No  . Drug use: No  . Sexual activity: Yes    Birth control/protection: None  Lifestyle  . Physical activity    Days per week: 0 days    Minutes per session: 0 min  . Stress: To some extent  Relationships  . Social Herbalist on phone: Not on file    Gets together: Not on file    Attends religious service: Not on file    Active member of club or organization: Not on file    Attends meetings of clubs or organizations: Not on file    Relationship status: Not on file  .  Intimate partner violence    Fear of current or ex partner: No    Emotionally abused: No    Physically abused: No    Forced sexual activity: No  Other Topics Concern  . Not on file  Social History Narrative   Works at [employer]   73 months old      Physical Exam  Vital Signs and Nursing Notes reviewed Vitals:   06/07/19 1048 06/07/19 1627  BP: (!) 139/107 (!) 147/119  Pulse: (!) 102 78  Resp: 16 16  Temp: 99.3 F (37.4 C)   SpO2: 100% 100%    CONSTITUTIONAL: Well-appearing, NAD NEURO:  Alert and oriented x 3, no focal deficits EYES:  eyes equal and reactive ENT/NECK:  no LAD, no JVD CARDIO: Regular rate, well-perfused, normal S1 and S2 PULM:  CTAB no wheezing or rhonchi GI/GU:  normal bowel sounds, non-distended, non-tender MSK/SPINE:  No gross deformities, no edema SKIN:  no rash, atraumatic PSYCH:  Appropriate speech and behavior  Diagnostic and Interventional Summary    EKG Interpretation   Date/Time:  Tuesday June 07 2019 10:52:25 EDT Ventricular Rate:  90 PR Interval:    QRS Duration: 78 QT Interval:  357 QTC Calculation: 437 R Axis:   48 Text Interpretation:  Sinus rhythm Borderline T abnormalities, anterior leads Baseline wander in lead(s) II III aVF V5 Confirmed by Gerlene Fee [phone]) on 06/07/2019 4:36:45 PM      Labs Reviewed  BASIC METABOLIC PANEL - Abnormal; Notable for the following components:      Result Value   CO2 21 (*)    All other components within normal limits  CBC - Abnormal; Notable for the following components:   RBC 5.57 (*)    MCH 25.5 (*)    All other components within normal limits  I-STAT BETA HCG BLOOD, ED ([facility], [facility], [facility] ONLY)  I-STAT BETA HCG BLOOD, ED (NOT ORDERABLE)  TROPONIN I (HIGH SENSITIVITY)    DG Chest 2 View  Final Result      Medications - No data to display   Procedures Critical Care  ED Course and Medical Decision Making  I have reviewed the triage vital signs and the nursing notes.  Pertinent labs & imaging results that were available during my care of the patient were reviewed by me and considered in my medical decision making (see below for details).  Chronic chest pain in this 33 year old female who has had multiple ED visits, has follow-up with GI, has been taking Protonix.  According to chart review she had a CTA done to evaluate for pulmonary embolism 1 or 2 years ago, which was unremarkable.  She has no evidence of DVT today, on my evaluation heart rate in the 70s, no increased work of breathing, little to no concern for PE.  Troponin is negative, chest x-ray and labs are otherwise reassuring.  Nothing to suggest emergent condition, she is appropriate for PCP and/or GI follow-up.  Barth Kirks. Sedonia Small, [place] [email or web address]  Final Clinical Impressions(s) / ED Diagnoses     ICD-10-CM   1. Chest pain, unspecified type  R07.9     ED Discharge Orders          Ordered    sucralfate (CARAFATE) 1 g tablet  4 times daily PRN     06/07/19 1652          Discharge Instructions Discussed with and Provided to Patient:   Discharge Instructions  You were evaluated in the Emergency Department and after careful evaluation, we did not find any emergent condition requiring admission or further testing in the hospital.  Your exam/testing today was overall reassuring.  You can use the Carafate medication as needed for pain.  Please return to the Emergency Department if you experience any worsening of your condition.  We encourage you to follow up with a primary care provider.  Thank you for allowing [place] to be a part of your care.       Maudie Flakes, MD 06/07/19 [phone]

## 2019-06-08 ENCOUNTER — Other Ambulatory Visit: Payer: Self-pay | Admitting: Internal Medicine

## 2019-06-08 ENCOUNTER — Encounter: Payer: Self-pay | Admitting: Internal Medicine

## 2019-06-08 DIAGNOSIS — R918 Other nonspecific abnormal finding of lung field: Secondary | ICD-10-CM

## 2019-06-09 ENCOUNTER — Ambulatory Visit: Payer: 59 | Admitting: Gastroenterology

## 2019-06-09 ENCOUNTER — Encounter: Payer: Self-pay | Admitting: Gastroenterology

## 2019-06-09 VITALS — BP 112/76 | HR 76 | Temp 97.8°F | Ht 65.0 in | Wt 116.2 lb

## 2019-06-09 DIAGNOSIS — K219 Gastro-esophageal reflux disease without esophagitis: Secondary | ICD-10-CM | POA: Diagnosis not present

## 2019-06-09 DIAGNOSIS — R0789 Other chest pain: Secondary | ICD-10-CM

## 2019-06-09 DIAGNOSIS — R1013 Epigastric pain: Secondary | ICD-10-CM

## 2019-06-09 MED ORDER — SUCRALFATE 1 G PO TABS: 1.0000 g | ORAL_TABLET | Freq: Four times a day (QID) | ORAL | 4 refills | Status: DC | PRN

## 2019-06-09 NOTE — Patient Instructions (Addendum)
You have been scheduled for an endoscopy. Please follow written instructions given to you at your visit today. If you use inhalers (even only as needed), please bring them with you on the day of your procedure.  We have sent the following medications to your pharmacy for you to pick up at your convenience: Carafate  Thank you for choosing me and [facility].  Dr. Rush Landmark

## 2019-06-10 NOTE — Progress Notes (Signed)
[facility] VISIT   Primary Care Provider Binnie Rail, MD [place] [address] [phone]  Patient Profile: Jasmine Flores is a 33 y.o. female with a pmh significant for Asthma, Pituitary Adenoma (s/p resection), Migraines, Pregnancy-related GERD.  The patient presents to the [facility] for an evaluation and management of problem(s) noted below:  Problem List 1. Gastroesophageal reflux disease, esophagitis presence not specified   2. Non-cardiac chest pain   3. Epigastric pain     History of Present Illness Please see initial consultation note and prior progress note for full details of HPI.    Interval History The patient is continuing to have recurrent symptoms of chest pain and burning in her mid chest.  The pain got so severe that she ended up going to the ED to be further evaluated.  Chest x-ray was performed of your labs and they were unremarkable.  She was started on Carafate.  For the last 2 and half days she is doing well without recurrence of symptoms.  She remains on twice daily PPI.  She has had some dysphagia.  No melena or hematochezia.  No hematemesis.  Nausea persists.  GI Review of Systems Positive as above Negative for early satiety  Review of Systems General: Denies fevers/chills Cardiovascular: Denies current chest pain or palpitations Pulmonary: Denies shortness of breath Gastroenterological: See HPI Genitourinary: Denies darkened urine Hematological: Denies easy bruising/bleeding Dermatological: Denies jaundice Psychological: Mood is anxious to try and move forward with her life (although excited because she is going to take her younger daughter to get her ears pierced)   Medications Current Outpatient Medications  Medication Sig Dispense Refill   acetaminophen (TYLENOL) 500 MG tablet Take 1,000 mg by mouth every 8 (eight) hours as needed for mild pain or headache.      Butalbital-APAP-Caffeine 50-300-40 MG CAPS Take 1 tablet by mouth every 4 (four) hours as needed (headaches).   2   pantoprazole (PROTONIX) 40 MG tablet Take 1 tablet (40 mg total) by mouth 2 (two) times daily. 60 tablet 3   Prenatal Vit-Fe Fumarate-FA (PRENATAL VITAMIN PO) Take 1 tablet by mouth every evening.      sucralfate (CARAFATE) 1 g tablet Take 1 tablet (1 g total) by mouth 4 (four) times daily as needed. 30 tablet 4   VENTOLIN HFA 108 (90 Base) MCG/ACT inhaler INHALE 2 PUFFS INTO THE LUNGS EVERY 6 (SIX) HOURS AS NEEDED FOR WHEEZING OR SHORTNESS OF BREATH. 18 g 0   No current facility-administered medications for this visit.     Allergies Allergies  Allergen Reactions   Shellfish Allergy Itching, Swelling, Shortness Of Breath and Anaphylaxis   Sulfa Antibiotics Itching and Swelling    Histories Past Medical History:  Diagnosis Date   Alpha trait thalassemia    Anemia    Asthma    Headache    HSV infection    Migraines    Positive TB test    Pregnancy induced hypertension    Vaginal Pap smear, abnormal    Past Surgical History:  Procedure Laterality Date   PITUITARY SURGERY  2008   for adenoma   TONSILECTOMY, ADENOIDECTOMY, BILATERAL MYRINGOTOMY AND TUBES     TONSILLECTOMY     and adenoids   WISDOM TOOTH EXTRACTION     Social History   Socioeconomic History   Marital status: Single    Spouse name: Not on file   Number of children: Not on file   Years  of education: Not on file   Highest education level: Not on file  Occupational History   Not on file  Social Needs   Financial resource strain: Not hard at all   Food insecurity    Worry: Never true    Inability: Never true   Transportation needs    Medical: No    Non-medical: Not on file  Tobacco Use   Smoking status: Never Smoker   Smokeless tobacco: Never Used  Substance and Sexual Activity   Alcohol use: No   Drug use: No   Sexual activity: Yes    Birth  control/protection: None  Lifestyle   Physical activity    Days per week: 0 days    Minutes per session: 0 min   Stress: To some extent  Relationships   Social connections    Talks on phone: Not on file    Gets together: Not on file    Attends religious service: Not on file    Active member of club or organization: Not on file    Attends meetings of clubs or organizations: Not on file    Relationship status: Not on file   Intimate partner violence    Fear of current or ex partner: No    Emotionally abused: No    Physically abused: No    Forced sexual activity: No  Other Topics Concern   Not on file  Social History Narrative   Works at [employer]   2 months old    Family History  Problem Relation Age of Onset   Diabetes Mother    Hypertension Mother    Diabetes Father    Hypertension Father    Stroke Father    Hypertension Maternal Grandmother    Diabetes Maternal Grandmother    Hypertension Maternal Grandfather    Diabetes Maternal Grandfather    Colon cancer Neg Hx    Esophageal cancer Neg Hx    Inflammatory bowel disease Neg Hx    Liver disease Neg Hx    Pancreatic cancer Neg Hx    Stomach cancer Neg Hx    Rectal cancer Neg Hx    I have reviewed her medical, social, and family history in detail and updated the electronic medical record as necessary.    PHYSICAL EXAMINATION  BP 112/76    Pulse 76    Temp 97.8 F (36.6 C)    Ht 5\' 5"  (1.651 m)    Wt 116 lb 3.2 oz (52.7 kg)    LMP 05/31/2019    BMI 19.34 kg/m  GEN: NAD, appears stated age, doesn't appear chronically ill PSYCH: Cooperative, without pressured speech EYE: Conjunctivae pink, sclerae anicteric ENT: MMM, without oral ulcers NECK: Supple CV: RR without R/Gs  RESP: CTAB posteriorly, without wheezing GI: NABS, soft, NT/ND, without rebound or guarding, no HSM appreciated MSK/EXT: No lower extremity edema SKIN: No jaundice NEURO:  Alert & Oriented x 3, no focal deficits   REVIEW OF  DATA  I reviewed the following data at the time of this encounter:  GI Procedures and Studies  No relevant studies to review  Laboratory Studies  Reviewed in epic  Imaging Studies  No new relevant studies to review   ASSESSMENT  Ms. Jasmine Flores is a 33 y.o. female with a pmh significant for Asthma, Pituitary Adenoma (s/p resection), Migraines, Pregnancy-related GERD.  The patient is seen today for evaluation and management of:  1. Gastroesophageal reflux disease, esophagitis presence not specified   2. Non-cardiac chest pain  3. Epigastric pain    The patient is hemodynamically stable but continues to have significant issues.  My prior thought of GERD versus NERD is now still higher my differential.  However, with her symptoms remaining on twice daily PPI the possibility of NERD versus hypersensitive esophagus must be kept in mind.  Functional heartburn could be playing a role also.  At this point in time with the persistence of her symptoms we will move forward with a diagnostic endoscopy for esophageal and gastric biopsies to be obtained at minimum.  Based on how things look at time of endoscopy will also likely plan to transition to a different PPI to see if she has a different effect such as Dexilant.  Bowels are not an issue currently.  If the above work-up is unremarkable or she still has issues then she will need pH impedance and manometry to rule out spasm/jackhammer esophagus as well as other dysmotility's and show [place] whether she could have NERD or hypersensitive esophagus.  The risks and benefits of endoscopic evaluation were discussed with the patient; these include but are not limited to the risk of perforation, infection, bleeding, missed lesions, lack of diagnosis, severe illness requiring hospitalization, as well as anesthesia and sedation related illnesses.  The patient is agreeable to proceed.  All patient questions were answered, to the best of my ability, and the patient agrees to  the aforementioned plan of action with follow-up as indicated.   PLAN  Continue Protonix 40 twice daily for now Diagnostic endoscopy (esophageal/gastric and possibly duodenal biopsies) Consider transitioning PPI to [place] depending on how she is doing and findings on endoscopy Okay to continue Carafate for now If work-up unremarkable above and still having issues will need pH impedance testing (off PPI) and manometry Patient may continue FiberCon fiber supplementation daily May use MiraLAX 1-2 times daily In future consider use of Linzess or Trulance   Orders Placed This Encounter  Procedures   Ambulatory referral to Gastroenterology    New Prescriptions   No medications on file   Modified Medications   Modified Medication Previous Medication   SUCRALFATE (CARAFATE) 1 G TABLET sucralfate (CARAFATE) 1 g tablet      Take 1 tablet (1 g total) by mouth 4 (four) times daily as needed.    Take 1 tablet (1 g total) by mouth 4 (four) times daily as needed.    Planned Follow Up No follow-ups on file.   Justice Britain, MD [facility] # [id number]

## 2019-06-14 ENCOUNTER — Telehealth: Payer: Self-pay

## 2019-06-14 NOTE — Telephone Encounter (Signed)
Covid-19 screening questions   Do you now or have you had a fever in the last 14 days?  Do you have any respiratory symptoms of shortness of breath or cough now or in the last 14 days?  Do you have any family members or close contacts with diagnosed or suspected Covid-19 in the past 14 days?  Have you been tested for Covid-19 and found to be positive?       

## 2019-06-14 NOTE — Telephone Encounter (Signed)
Pt responded "no" to all the screening questions.

## 2019-06-15 ENCOUNTER — Other Ambulatory Visit: Payer: Self-pay

## 2019-06-15 ENCOUNTER — Ambulatory Visit (AMBULATORY_SURGERY_CENTER): Payer: 59 | Admitting: Gastroenterology

## 2019-06-15 ENCOUNTER — Ambulatory Visit (INDEPENDENT_AMBULATORY_CARE_PROVIDER_SITE_OTHER)
Admission: RE | Admit: 2019-06-15 | Discharge: 2019-06-15 | Disposition: A | Discharge status: Home / Self Care | Payer: 59 | Source: Ambulatory Visit | Attending: Internal Medicine | Admitting: Internal Medicine

## 2019-06-15 ENCOUNTER — Encounter: Payer: Self-pay | Admitting: Gastroenterology

## 2019-06-15 VITALS — BP 142/98 | HR 66 | Temp 97.9°F | Resp 16 | Ht 65.0 in | Wt 116.0 lb

## 2019-06-15 DIAGNOSIS — K259 Gastric ulcer, unspecified as acute or chronic, without hemorrhage or perforation: Secondary | ICD-10-CM | POA: Diagnosis not present

## 2019-06-15 DIAGNOSIS — K228 Other specified diseases of esophagus: Secondary | ICD-10-CM

## 2019-06-15 DIAGNOSIS — R911 Solitary pulmonary nodule: Secondary | ICD-10-CM | POA: Diagnosis not present

## 2019-06-15 DIAGNOSIS — K219 Gastro-esophageal reflux disease without esophagitis: Secondary | ICD-10-CM | POA: Diagnosis not present

## 2019-06-15 DIAGNOSIS — K3189 Other diseases of stomach and duodenum: Secondary | ICD-10-CM | POA: Diagnosis not present

## 2019-06-15 DIAGNOSIS — K2970 Gastritis, unspecified, without bleeding: Secondary | ICD-10-CM

## 2019-06-15 DIAGNOSIS — R918 Other nonspecific abnormal finding of lung field: Secondary | ICD-10-CM | POA: Diagnosis not present

## 2019-06-15 MED ORDER — SODIUM CHLORIDE 0.9 % IV SOLN: 500.0000 mL | Freq: Once | INTRAVENOUS | Status: DC

## 2019-06-15 NOTE — Progress Notes (Signed)
Temp taken by JB VS taken by CW 

## 2019-06-15 NOTE — Op Note (Signed)
[facility] Patient Name: Jasmine Flores Procedure Date: 06/15/2019 3:18 PM MRN: [id number] Endoscopist: Justice Britain , MD Age: 33 Referring MD:  Date of Birth: 19-Nov-1985 Gender: Female Account #: [id number] Procedure:                Upper GI endoscopy Indications:              Indigestion, Dysphagia, Heartburn, Suspected                            gastro-esophageal reflux disease, Exclusion of                            peptic ulcer, Chest pain (non cardiac) Medicines:                Monitored Anesthesia Care Procedure:                Pre-Anesthesia Assessment:                           - Prior to the procedure, a History and Physical                            was performed, and patient medications and                            allergies were reviewed. The patient's tolerance of                            previous anesthesia was also reviewed. The risks                            and benefits of the procedure and the sedation                            options and risks were discussed with the patient.                            All questions were answered, and informed consent                            was obtained. Prior Anticoagulants: The patient has                            taken no previous anticoagulant or antiplatelet                            agents. ASA Grade Assessment: II - A patient with                            mild systemic disease. After reviewing the risks                            and benefits, the patient was deemed in  satisfactory condition to undergo the procedure.                           After obtaining informed consent, the endoscope was                            passed under direct vision. Throughout the                            procedure, the patient's blood pressure, pulse, and                            oxygen saturations were monitored continuously. The                            Endoscope was  introduced through the mouth, and                            advanced to the second part of duodenum. The upper                            GI endoscopy was accomplished without difficulty.                            The patient tolerated the procedure. Scope In: Scope Out: Findings:                 No gross lesions were noted in the entire                            esophagus. Biopsies were taken with a cold forceps                            for histology to rule out EoE and LoE.                           A widely patent and non-obstructing Schatzki ring                            was found at the gastroesophageal junction (35 cm).                            Biopsies were taken with a cold forceps for                            histology to disrupt this. Then, a guidewire was                            placed and the scope was withdrawn. Dilation was                            performed with a Savary dilator with no resistance  at 16 mm and 17 mm and mild resistance at 18 mm.                            The dilation site was examined following endoscope                            reinsertion and showed no change.                           A small, 1 cm, hiatal hernia was present.                           Multiple dispersed, medium non-bleeding erosions                            were found in the gastric body, in the gastric                            antrum and in the prepyloric region of the stomach.                           Patchy moderately erythematous mucosa without                            bleeding was found in the entire examined stomach.                           No other gross lesions were noted in the entire                            examined stomach. Biopsies were taken with a cold                            forceps for histology and Helicobacter pylori                            testing.                           No gross lesions were noted in  the duodenal bulb,                            in the first portion of the duodenum and in the                            second portion of the duodenum. Biopsies for                            histology were taken with a cold forceps for                            evaluation of celiac disease and to rule out  enteropathy. Complications:            No immediate complications. Estimated Blood Loss:     Estimated blood loss was minimal. Impression:               - No gross lesions in esophagus. Biopsied.                           - Widely patent and non-obstructing Schatzki ring.                            Biopsied to disrupt. Dilated.                           - Small hiatal hernia.                           - Non-bleeding erosive gastropathy. Erythematous                            mucosa in the stomach. Biopsied.                           - No gross lesions in the duodenal bulb, in the                            first portion of the duodenum and in the second                            portion of the duodenum. Biopsied. Recommendation:           - The patient will be observed post-procedure,                            until all discharge criteria are met.                           - Discharge patient to home.                           - Patient has a contact number available for                            emergencies. The signs and symptoms of potential                            delayed complications were discussed with the                            patient. Return to normal activities tomorrow.                            Written discharge instructions were provided to the                            patient.                           -  Resume previous diet.                           - For now continue BID PPI. Will consider                            transitioning to Dexilant 30 mg BID depending on                            patient's symptoms into next  week.                           - Continue Carafate with meals and before bedtime.                           - Await pathology results.                           - Previously discussed role of Manometry and pH                            impedence testing. Could Could consider manometry                            but would not stop PPI for pH impedence testing                            currently with severity of erosive gastritis                            currently.                           - Observe patient's clinical course.                           - Repeat upper endoscopy in 10 weeks to check                            healing.                           - May consider balloon dilation of area.                           - Follow up in clinic in 3-4 weeks.                           - May consider sending Gastrin level (however                            likely to be elevated as a result of PPI use                            currently.                           -  The findings and recommendations were discussed                            with the patient. Justice Britain, MD 06/15/2019 4:11:27 PM

## 2019-06-15 NOTE — Patient Instructions (Signed)
Please read handouts provided. Continue present medications. Please follow Dilation Diet. Await pathology results Follow-up in clinic in 3-4 weeks.         YOU HAD AN ENDOSCOPIC PROCEDURE TODAY AT [facility]:   Refer to the procedure report that was given to you for any specific questions about what was found during the examination.  If the procedure report does not answer your questions, please call your gastroenterologist to clarify.  If you requested that your care partner not be given the details of your procedure findings, then the procedure report has been included in a sealed envelope for you to review at your convenience later.  YOU SHOULD EXPECT: Some feelings of bloating in the abdomen. Passage of more gas than usual.  Walking can help get rid of the air that was put into your GI tract during the procedure and reduce the bloating. If you had a lower endoscopy (such as a colonoscopy or flexible sigmoidoscopy) you may notice spotting of blood in your stool or on the toilet paper. If you underwent a bowel prep for your procedure, you may not have a normal bowel movement for a few days.  Please Note:  You might notice some irritation and congestion in your nose or some drainage.  This is from the oxygen used during your procedure.  There is no need for concern and it should clear up in a day or so.  SYMPTOMS TO REPORT IMMEDIATELY:    Following upper endoscopy (EGD)  Vomiting of blood or coffee ground material  New chest pain or pain under the shoulder blades  Painful or persistently difficult swallowing  New shortness of breath  Fever of 100F or higher  Black, tarry-looking stools  For urgent or emergent issues, a gastroenterologist can be reached at any hour by calling [phone].   DIET:  We do recommend a small meal at first, but then you may proceed to your regular diet.  Drink plenty of fluids but you should avoid alcoholic beverages for 24  hours.  ACTIVITY:  You should plan to take it easy for the rest of today and you should NOT DRIVE or use heavy machinery until tomorrow (because of the sedation medicines used during the test).    FOLLOW UP: Our staff will call the number listed on your records 48-72 hours following your procedure to check on you and address any questions or concerns that you may have regarding the information given to you following your procedure. If we do not reach you, we will leave a message.  We will attempt to reach you two times.  During this call, we will ask if you have developed any symptoms of COVID 19. If you develop any symptoms (ie: fever, flu-like symptoms, shortness of breath, cough etc.) before then, please call [phone].  If you test positive for Covid 19 in the 2 weeks post procedure, please call and report this information to [place].    If any biopsies were taken you will be contacted by phone or by letter within the next 1-3 weeks.  Please call us at [phone] if you have not heard about the biopsies in 3 weeks.    SIGNATURES/CONFIDENTIALITY: You and/or your care partner have signed paperwork which will be entered into your electronic medical record.  These signatures attest to the fact that that the information above on your After Visit Summary has been reviewed and is understood.  Full responsibility of the confidentiality of this discharge information  lies with you and/or your care-partner.

## 2019-06-15 NOTE — Progress Notes (Signed)
PT taken to PACU. Monitors in place. VSS. Report given to RN. 

## 2019-06-16 ENCOUNTER — Encounter: Payer: Self-pay | Admitting: Internal Medicine

## 2019-06-17 ENCOUNTER — Encounter: Payer: 59 | Admitting: Gastroenterology

## 2019-06-17 ENCOUNTER — Telehealth: Payer: Self-pay | Admitting: *Deleted

## 2019-06-17 NOTE — Telephone Encounter (Signed)
The pt will be called on Monday 9/14 to get an update

## 2019-06-17 NOTE — Telephone Encounter (Signed)
Thanks for letting me know. Let's plan on patient starting Cepacol lozenges + Choloraseptic. Patty, please call again this morning and if this is significantly different discomfort than what she has had previously, then we should proceed with a CXR & Neck X-Ray to ensure that we have no complication from the dilation portion of procedure. I think unlikely based on what she has had previously, but we should see. I'll be away this afternoon, but will be on look out for the imaging if we do end up getting it completed. Thanks. GM

## 2019-06-17 NOTE — Telephone Encounter (Signed)
appt scheduled

## 2019-06-17 NOTE — Telephone Encounter (Signed)
Thanks for update. Can you reach out to her on Monday Patty? GM

## 2019-06-17 NOTE — Telephone Encounter (Signed)
Call placed back to patient and informed  her with Dr. Rush Landmark instructions and she verbalized understanding.

## 2019-06-17 NOTE — Telephone Encounter (Signed)
The pt was called and asked on for a condition update.  She states that she is gradually getting better and better.  The pain is less than yesterday.  She states that she feels that she is improving slowly.

## 2019-06-17 NOTE — Telephone Encounter (Signed)
  Follow up Call-  Call back number 06/15/2019  Post procedure Call Back phone  # [phone]  Permission to leave phone message Yes  Some recent data might be hidden     Patient questions:  Do you have a fever, pain , or abdominal swelling? Yes.   Pain Score  7 *  Have you tolerated food without any problems? No.  Have you been able to return to your normal activities? Yes.    Do you have any questions about your discharge instructions: Diet   No. Medications  No. Follow up visit  No.  Do you have questions or concerns about your Care? No.  Actions: * If pain score is 4 or above: Physician/ provider Notified : Justice Britain, MD    Pt. Complaining of pain level of 7 since dilation,she stated "I don't know how to describe it but it is more intense" when I swallow food.she did admit having discomfort prior to procedure,please advise.   1. Have you developed a fever since your procedure? no  2.   Have you had an respiratory symptoms (SOB or cough) since your procedure? no  3.   Have you tested positive for COVID 19 since your procedure no  4.   Have you had any family members/close contacts diagnosed with the COVID 19 since your procedure?  no   If yes to any of these questions please route to Joylene John, RN and Alphonsa Gin, Therapist, sports. Marland Kitchen

## 2019-06-20 ENCOUNTER — Telehealth: Payer: Self-pay

## 2019-06-20 ENCOUNTER — Ambulatory Visit: Payer: 59 | Admitting: Internal Medicine

## 2019-06-20 NOTE — Telephone Encounter (Signed)
I spoke with the pt and she states she is much better and will call if she has any questions or concerns

## 2019-06-20 NOTE — Telephone Encounter (Signed)
-----   Message from Timothy Lasso, RN sent at 06/17/2019 11:25 AM EDT ----- Call pt on Monday to get an update

## 2019-06-20 NOTE — Telephone Encounter (Signed)
Thank you for the update Patty. GM

## 2019-06-20 NOTE — Progress Notes (Signed)
Subjective:    Patient ID: Jasmine Flores, female    DOB: 01/30/1986, 33 y.o.   MRN: [id number]  HPI The patient is here for an acute visit for migraines and elevated blood pressure.   Elevated blood pressure: She does check it regularly and it has been 130-140s/90s-118.  It has been elevated for the past 2 weeks.  She is unsure why it has been elevated.  Her stress level is lower than it was previously.  She denies any major changes in diet or medications.  She denies palpitations, shortness of breath and leg swelling.  She has had some chest pain, but knows this is related to her gastrointestinal issues.  She has had increased headaches/migraines.  Migraine headaches: She has had increased migraines for several months-possibly February.  At one point she was waking up with a daily migraine, but now she is having approximately 4 migraines a week.  She has been taking the Fioricet 1 pill as needed, but has needed multiple doses.  She is also taking Excedrin Migraine, which has been very helpful in the most effective medication, but she thinks it is upsetting her stomach.  She thinks she may have tried Imitrex in the past, but does not recall if she had any side effects.  She is unsure why she has been experiencing increased migraines.     Medications and allergies reviewed with patient and updated if appropriate.  Patient Active Problem List   Diagnosis Date Noted  . Gastroesophageal reflux disease 04/19/2019  . Abnormal MRI 04/19/2019  . Abnormal findings on diagnostic imaging of abdomen 04/19/2019  . Non-cardiac chest pain 03/26/2019  . Epigastric pain 03/26/2019  . Chronic constipation 03/26/2019  . Nausea 03/23/2019  . History of gestational hypertension 07/01/2018  . Palpitations 06/22/2017  . 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 Joint pain 06/22/2017  . Lung nodules, stable no f/u needed 12/28/2016  . Other chest pain 12/28/2016  . Pituitary adenoma ([place]) 09/08/2016  . Asthma  09/03/2015  . Hx of migraines 09/03/2015    Current Outpatient Medications on File Prior to Visit  Medication Sig Dispense Refill  . acetaminophen (TYLENOL) 500 MG tablet Take 1,000 mg by mouth every 8 (eight) hours as needed for mild pain or headache.    . Prenatal Vit-Fe Fumarate-FA (PRENATAL VITAMIN PO) Take 1 tablet by mouth every evening.     . sucralfate (CARAFATE) 1 g tablet Take 1 tablet (1 g total) by mouth 4 (four) times daily as needed. 30 tablet 4  . VENTOLIN HFA 108 (90 Base) MCG/ACT inhaler INHALE 2 PUFFS INTO THE LUNGS EVERY 6 (SIX) HOURS AS NEEDED FOR WHEEZING OR SHORTNESS OF BREATH. 18 g 0  . pantoprazole (PROTONIX) 40 MG tablet Take 1 tablet (40 mg total) by mouth 2 (two) times daily. 60 tablet 3   No current facility-administered medications on file prior to visit.     Past Medical History:  Diagnosis Date  . Alpha trait thalassemia   . Anemia   . Asthma   . Headache   . HSV infection   . Migraines   . Positive TB test   . Pregnancy induced hypertension   . Vaginal Pap smear, abnormal     Past Surgical History:  Procedure Laterality Date  . 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 TONSILECTOMY, ADENOIDECTOMY, BILATERAL MYRINGOTOMY AND TUBES    . TONSILLECTOMY     and adenoids  . WISDOM TOOTH EXTRACTION  Social History   Socioeconomic History  . Marital status: Single    Spouse name: Not on file  . Number of children: Not on file  . Years of education: Not on file  . Highest education level: Not on file  Occupational History  . Not on file  Social Needs  . Financial resource strain: Not hard at all  . Food insecurity    Worry: Never true    Inability: Never true  . Transportation needs    Medical: No    Non-medical: Not on file  Tobacco Use  . Smoking status: Never Smoker  . Smokeless tobacco: Never Used  Substance and Sexual Activity  . Alcohol use: No  . Drug use: No  . Sexual activity: Yes    Birth control/protection: None   Lifestyle  . Physical activity    Days per week: 0 days    Minutes per session: 0 min  . Stress: To some extent  Relationships  . Social Herbalist on phone: Not on file    Gets together: Not on file    Attends religious service: Not on file    Active member of club or organization: Not on file    Attends meetings of clubs or organizations: Not on file    Relationship status: Not on file  Other Topics Concern  . Not on file  Social History Narrative   Works at [employer]   36 months old     Family History  Problem Relation Age of Onset  . Diabetes Mother   . Hypertension Mother   . Diabetes Father   . Hypertension Father   . Stroke Father   . Hypertension Maternal Grandmother   . Diabetes Maternal Grandmother   . Hypertension Maternal Grandfather   . Diabetes Maternal Grandfather   . Colon cancer Neg Hx   . Esophageal cancer Neg Hx   . Inflammatory bowel disease Neg Hx   . Liver disease Neg Hx   . Pancreatic cancer Neg Hx   . Stomach cancer Neg Hx   . Rectal cancer Neg Hx     Review of Systems  Constitutional: Negative for fever.  Eyes: Negative for visual disturbance.  Respiratory: Negative for shortness of breath.   Cardiovascular: Positive for chest pain (GI related). Negative for palpitations and leg swelling.  Gastrointestinal: Negative for nausea.  Neurological: Positive for dizziness (once) and headaches. Negative for weakness, light-headedness and numbness.       Objective:   Vitals:   06/21/19 1052  BP: (!) 144/100  Pulse: 80  Resp: 16  Temp: 97.8 F (36.6 C)  SpO2: 99%   BP Readings from Last 3 Encounters:  06/21/19 (!) 144/100  06/15/19 (!) 142/98  06/09/19 112/76   Wt Readings from Last 3 Encounters:  06/21/19 120 lb 6.4 oz (54.6 kg)  06/15/19 116 lb (52.6 kg)  06/09/19 116 lb 3.2 oz (52.7 kg)   Body mass index is 20.04 kg/m.   Physical Exam    Constitutional: Appears well-developed and well-nourished. No distress.  HENT:   Head: Normocephalic and atraumatic.  Neck: Neck supple. No tracheal deviation present. No thyromegaly present.  No cervical lymphadenopathy Cardiovascular: Normal rate, regular rhythm and normal heart sounds.   No murmur heard. No carotid bruit .  No edema Pulmonary/Chest: Effort normal and breath sounds normal. No respiratory distress. No has no wheezes. No rales.  Neurological: Nonfocal Skin: Skin is warm and dry. Not diaphoretic.  Psychiatric: Normal mood  and affect. Behavior is normal.       Assessment & Plan:    See Problem List for Assessment and Plan of chronic medical problems.

## 2019-06-21 ENCOUNTER — Ambulatory Visit (INDEPENDENT_AMBULATORY_CARE_PROVIDER_SITE_OTHER): Payer: 59 | Admitting: Internal Medicine

## 2019-06-21 ENCOUNTER — Encounter: Payer: Self-pay | Admitting: Internal Medicine

## 2019-06-21 ENCOUNTER — Other Ambulatory Visit: Payer: Self-pay

## 2019-06-21 VITALS — BP 144/100 | HR 80 | Temp 97.8°F | Resp 16 | Ht 65.0 in | Wt 120.4 lb

## 2019-06-21 DIAGNOSIS — Z23 Encounter for immunization: Secondary | ICD-10-CM | POA: Diagnosis not present

## 2019-06-21 DIAGNOSIS — I10 Essential (primary) hypertension: Secondary | ICD-10-CM

## 2019-06-21 DIAGNOSIS — G43009 Migraine without aura, not intractable, without status migrainosus: Secondary | ICD-10-CM | POA: Diagnosis not present

## 2019-06-21 MED ORDER — BUTALBITAL-APAP-CAFFEINE 50-300-40 MG PO CAPS: 1.0000 | ORAL_CAPSULE | ORAL | 2 refills | Status: DC | PRN

## 2019-06-21 MED ORDER — PROPRANOLOL HCL 40 MG PO TABS: 40.0000 mg | ORAL_TABLET | Freq: Two times a day (BID) | ORAL | 5 refills | Status: DC

## 2019-06-21 NOTE — Assessment & Plan Note (Signed)
Blood pressure has been elevated for the past 2 weeks Unknown cause for elevation, but we both agree she needs something to help bring that down We will start propranolol 40 mg twice daily-hopefully this will help her blood pressure and migraines May need to titrate dose She will monitor at home and update me via my chart Low-sodium diet stressed

## 2019-06-21 NOTE — Assessment & Plan Note (Signed)
Having more frequent migraines-approximately 4-week Needs to decrease Excedrin Migraine use because of her gastritis Will continue Fioricet as needed-advised to take 2 pills as needed, which may be more effective and ultimately decrease her as needed medication Also started on propanolol for her blood pressure and will hopefully help with migraine prophylaxis-start 40 mg twice daily-we will titrate dose depending on blood pressure and migraines We can try Imitrex or Zomig/Maxalt if needed

## 2019-06-21 NOTE — Patient Instructions (Signed)
Start propranolol 40 mg twice daily.   Take 2 fioricet if needed when you have a headache.  Decrease the Excedrin.    We can try a triptan.

## 2019-06-28 ENCOUNTER — Encounter: Payer: Self-pay | Admitting: Gastroenterology

## 2019-07-07 ENCOUNTER — Encounter: Payer: Self-pay | Admitting: Internal Medicine

## 2019-07-09 ENCOUNTER — Other Ambulatory Visit: Payer: Self-pay | Admitting: Internal Medicine

## 2019-07-09 MED ORDER — PROPRANOLOL HCL 40 MG PO TABS: 80.0000 mg | ORAL_TABLET | Freq: Two times a day (BID) | ORAL | 5 refills | Status: DC

## 2019-07-21 ENCOUNTER — Ambulatory Visit: Payer: 59 | Admitting: Gastroenterology

## 2019-07-22 ENCOUNTER — Other Ambulatory Visit: Payer: Self-pay | Admitting: Internal Medicine

## 2019-07-26 ENCOUNTER — Encounter: Payer: Self-pay | Admitting: Internal Medicine

## 2019-07-27 ENCOUNTER — Other Ambulatory Visit: Payer: Self-pay

## 2019-07-27 MED ORDER — PROPRANOLOL HCL 40 MG PO TABS: 80.0000 mg | ORAL_TABLET | Freq: Two times a day (BID) | ORAL | 1 refills | Status: DC

## 2019-07-29 ENCOUNTER — Other Ambulatory Visit: Payer: Self-pay | Admitting: Gastroenterology

## 2019-08-25 ENCOUNTER — Encounter: Payer: Self-pay | Admitting: Gastroenterology

## 2019-08-25 ENCOUNTER — Ambulatory Visit: Payer: 59 | Admitting: Gastroenterology

## 2019-08-25 VITALS — BP 142/84 | HR 75 | Temp 97.9°F | Ht 65.0 in | Wt 121.0 lb

## 2019-08-25 DIAGNOSIS — R0789 Other chest pain: Secondary | ICD-10-CM | POA: Diagnosis not present

## 2019-08-25 DIAGNOSIS — K2930 Chronic superficial gastritis without bleeding: Secondary | ICD-10-CM | POA: Diagnosis not present

## 2019-08-25 DIAGNOSIS — K5909 Other constipation: Secondary | ICD-10-CM

## 2019-08-25 MED ORDER — SUCRALFATE 1 G PO TABS: 1.0000 g | ORAL_TABLET | Freq: Four times a day (QID) | ORAL | 2 refills | Status: DC

## 2019-08-25 MED ORDER — PANTOPRAZOLE SODIUM 40 MG PO TBEC: 40.0000 mg | DELAYED_RELEASE_TABLET | Freq: Every day | ORAL | 4 refills | Status: DC

## 2019-08-25 NOTE — Progress Notes (Signed)
[facility] VISIT   Primary Care Provider Binnie Rail, MD [place] [address] [phone]  Patient Profile: Jasmine Flores is a 33 y.o. female with a pmh significant for Asthma, Pituitary Adenoma (s/p resection), Migraines, Pregnancy-related GERD, PUD (negative for H. pylori via biopsies).  The patient presents to the [facility] for an evaluation and management of problem(s) noted below:  Problem List 1. Non-cardiac chest pain   2. Chronic superficial gastritis without bleeding   3. Chronic constipation     History of Present Illness Please see initial consultation note and prior progress note for full details of HPI.    Interval History The patient returns today and has been doing well.  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  Her stress levels have decreased as result of being out of the administrative portion of her prior role.  She is happy at home.  Overall she has not had recurrence of her chest pain at this time by taking her Carafate as well as her PPI.  The patient's weight is stable.  She is happy with where things are but hopes to 1 day come off of medications, which is good.  GI Review of Systems Positive as above Negative for dysphagia, odynophagia, abdominal pain, nausea, vomiting  Review of Systems General: Denies fevers/chills Cardiovascular: Denies current chest pain Pulmonary: Denies shortness of breath Gastroenterological: See HPI Genitourinary: Denies darkened urine Hematological: Denies easy bruising/bleeding Dermatological: Denies jaundice Psychological: Mood is stable   Medications Current Outpatient Medications  Medication Sig Dispense Refill  . acetaminophen (TYLENOL) 500 MG tablet Take 1,000 mg by mouth every 8 (eight) hours as needed for mild pain or headache.    .  albuterol (VENTOLIN HFA) 108 (90 Base) MCG/ACT inhaler INHALE 2 PUFFS BY MOUTH INTO THE LUNGS EVERY 6 HOURS AS NEEDED FOR WHEEZING OR SHORTNESS OF BREATH. 18 g 0  . Butalbital-APAP-Caffeine 50-300-40 MG CAPS Take 1-2 tablets by mouth every 4 (four) hours as needed (headaches). 90 capsule 2  . Prenatal Vit-Fe Fumarate-FA (PRENATAL VITAMIN PO) Take 1 tablet by mouth every evening.     . propranolol (INDERAL) 40 MG tablet Take 2 tablets (80 mg total) by mouth 2 (two) times daily. 120 tablet 1  . pantoprazole (PROTONIX) 40 MG tablet Take 1 tablet (40 mg total) by mouth daily. 30 tablet 4  . sucralfate (CARAFATE) 1 g tablet Take 1 tablet (1 g total) by mouth 4 (four) times daily. 120 tablet 2   No current facility-administered medications for this visit.     Allergies Allergies  Allergen Reactions  . Shellfish Allergy Itching, Swelling, Shortness Of Breath and Anaphylaxis  . Sulfa Antibiotics Itching and Swelling    Histories Past Medical History:  Diagnosis Date  . Alpha trait thalassemia   . Anemia   . Asthma   . Headache   . HSV infection   . Migraines   . Positive TB test   . Pregnancy induced hypertension   . Vaginal Pap smear, abnormal    Past Surgical History:  Procedure Laterality Date  . 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 TONSILECTOMY, ADENOIDECTOMY, BILATERAL MYRINGOTOMY AND TUBES    . TONSILLECTOMY     and adenoids  . WISDOM TOOTH EXTRACTION     Social History   Socioeconomic History  . Marital status: Single  Spouse name: Not on file  . Number of children: Not on file  . Years of education: Not on file  . Highest education level: Not on file  Occupational History  . Not on file  Social Needs  . Financial resource strain: Not hard at all  . Food insecurity    Worry: Never true    Inability: Never true  . Transportation needs    Medical: No    Non-medical: Not on file  Tobacco Use  . Smoking status: Never Smoker  . Smokeless tobacco: Never Used   Substance and Sexual Activity  . Alcohol use: No  . Drug use: No  . Sexual activity: Yes    Birth control/protection: None  Lifestyle  . Physical activity    Days per week: 0 days    Minutes per session: 0 min  . Stress: To some extent  Relationships  . Social Herbalist on phone: Not on file    Gets together: Not on file    Attends religious service: Not on file    Active member of club or organization: Not on file    Attends meetings of clubs or organizations: Not on file    Relationship status: Not on file  . Intimate partner violence    Fear of current or ex partner: No    Emotionally abused: No    Physically abused: No    Forced sexual activity: No  Other Topics Concern  . Not on file  Social History Narrative   Works at [employer]   69 months old    Family History  Problem Relation Age of Onset  . Diabetes Mother   . Hypertension Mother   . Diabetes Father   . Hypertension Father   . Stroke Father   . Hypertension Maternal Grandmother   . Diabetes Maternal Grandmother   . Hypertension Maternal Grandfather   . Diabetes Maternal Grandfather   . Colon cancer Neg Hx   . Esophageal cancer Neg Hx   . Inflammatory bowel disease Neg Hx   . Liver disease Neg Hx   . Pancreatic cancer Neg Hx   . Stomach cancer Neg Hx   . Rectal cancer Neg Hx    I have reviewed her medical, social, and family history in detail and updated the electronic medical record as necessary.    PHYSICAL EXAMINATION  BP (!) 142/84   Pulse 75   Temp 97.9 F (36.6 C) (Temporal)   Ht 5\' 5"  (1.651 m)   Wt 121 lb (54.9 kg)   BMI 20.14 kg/m  GEN: NAD, appears stated age, doesn't appear chronically ill PSYCH: Cooperative, without pressured speech EYE: Conjunctivae pink, sclerae anicteric ENT: MMM, without oral ulcers NECK: Supple CV: RR without R/Gs  RESP: CTAB posteriorly, without wheezing GI: NABS, soft, NT/ND, without rebound or guarding, no HSM appreciated MSK/EXT: No lower  extremity edema SKIN: No jaundice NEURO:  Alert & Oriented x 3, no focal deficits   REVIEW OF DATA  I reviewed the following data at the time of this encounter:  GI Procedures and Studies  No relevant studies to review  Laboratory Studies  Reviewed in epic  Imaging Studies  No new relevant studies to review   ASSESSMENT  Ms. Jasmine Flores is a 33 y.o. female with a pmh significant for Asthma, Pituitary Adenoma (s/p resection), Migraines, Pregnancy-related GERD, PUD (negative for H. pylori via biopsies).  The patient is seen today for evaluation and management of:  1. Non-cardiac  chest pain   2. Chronic superficial gastritis without bleeding   3. Chronic constipation    The patient is hemodynamically and clinically stable.  She is doing well on her current dosing of PPI as well as Carafate.  We briefly discussed consideration of repeat endoscopy and for now we are going to hold on that.  I think I had like to go ahead and see how she does by trying to decrease her PPI to once daily.  We will maintain her Carafate at current dosing of before every meal plus nightly through the end of the year/holidays.  She will then try to transition to twice daily dosing of Carafate.  If the patient has recurrence of symptoms when we see her back in follow-up we will consider the role of a repeat endoscopy to evaluate for healing of the gastric erosions/gastritis that were found on recent endoscopy.  And we will have her repeat increase of her Carafate.  There is a possibility that she actually did have or does have a functional heartburn and we may consider the role of TCAs down the road if she has recurrent chest pain but I would also plan on doing a manometry and pH impedance test on her if this were the case in the future off PPI.  Hopefully will have to go down this road.  Her constipation is improved with FiberCon and she will continue that at this time.  We will see her back in the future (2021) or sooner  as needed.  All patient questions were answered, to the best of my ability, and the patient agrees to the aforementioned plan of action with follow-up as indicated. plan of action with follow-up as indicated.   PLAN  Transition to Protonix 40 mg once daily-refills given Continue Carafate before every meal plus nightly-refills given Hold on follow-up endoscopy for now If patient has worsening symptoms we will need to repeat endoscopy for surveillance purposes and increase Carafate back up Chest pain and heartburn symptoms better and will consider medication as needed always manometry (off PPI) Continue FiberCon daily May use MiraLAX 1-2 times daily Future consider use of Linzess or Trulance    No orders of the defined types were placed in this encounter.   New Prescriptions   PANTOPRAZOLE (PROTONIX) 40 MG TABLET    Take 1 tablet (40 mg total) by mouth daily.   SUCRALFATE (CARAFATE) 1 G TABLET    Take 1 tablet (1 g total) by mouth 4 (four) times daily.   Modified Medications   No medications on file    Planned Follow Up Return in about 4 years (around 08/25/2023).   Justice Britain, MD [facility] # [phone]

## 2019-08-25 NOTE — Patient Instructions (Signed)
If you are age 35 or older, your body mass index should be between 23-30. Your Body mass index is 20.14 kg/m. If this is out of the aforementioned range listed, please consider follow up with your Primary Care Provider.  If you are age 59 or younger, your body mass index should be between 19-25. Your Body mass index is 20.14 kg/m. If this is out of the aformentioned range listed, please consider follow up with your Primary Care Provider.    We have sent the following medications to your pharmacy for you to pick up at your convenience: Pantoprazole and Carafate .  Decrease pantoprazole to once daily.   Continue Carafate 4 times daily through Dec 2020, then decrease to twice daily starting Jan 2021.   Thank you for choosing me and [facility].  Dr. Rush Landmark

## 2019-08-26 DIAGNOSIS — K2970 Gastritis, unspecified, without bleeding: Secondary | ICD-10-CM | POA: Insufficient documentation

## 2019-09-09 ENCOUNTER — Other Ambulatory Visit: Payer: Self-pay | Admitting: Internal Medicine

## 2019-09-29 ENCOUNTER — Other Ambulatory Visit: Payer: Self-pay | Admitting: Internal Medicine

## 2019-10-11 ENCOUNTER — Other Ambulatory Visit: Payer: Self-pay | Admitting: Internal Medicine

## 2019-10-12 NOTE — Telephone Encounter (Signed)
Tried to locate on [facility] pt could not be located.Jasmine KitchenJohny Chess

## 2019-11-04 ENCOUNTER — Telehealth: Payer: Self-pay | Admitting: Internal Medicine

## 2019-11-04 ENCOUNTER — Other Ambulatory Visit: Payer: Self-pay | Admitting: Internal Medicine

## 2019-11-07 MED ORDER — PROPRANOLOL HCL 40 MG PO TABS: 80.0000 mg | ORAL_TABLET | Freq: Two times a day (BID) | ORAL | 0 refills | Status: DC

## 2019-11-07 NOTE — Telephone Encounter (Signed)
This is a Dr.Burns patient. Patient was calling to follow up on refill.

## 2019-11-07 NOTE — Addendum Note (Signed)
Addended by: Delice Bison E on: 11/07/2019 03:47 PM   Modules accepted: Orders

## 2019-11-09 NOTE — Progress Notes (Deleted)
Subjective:    Patient ID: Jasmine Flores, female    DOB: 1986/06/05, 34 y.o.   MRN: [id number]  HPI The patient is here for follow up of their chronic medical problems, including asthma, hypertension, migraine headaches.  She is taking all of her medications as prescribed.   She is exercising regularly.       Medications and allergies reviewed with patient and updated if appropriate.  Patient Active Problem List   Diagnosis Date Noted  . Gastritis without bleeding 08/26/2019  . Hypertension 06/21/2019  . Gastroesophageal reflux disease 04/19/2019  . Abnormal MRI 04/19/2019  . Abnormal findings on diagnostic imaging of abdomen 04/19/2019  . Non-cardiac chest pain 03/26/2019  . Epigastric pain 03/26/2019  . Chronic constipation 03/26/2019  . Nausea 03/23/2019  . History of gestational hypertension 07/01/2018  . Palpitations 06/22/2017  . 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 Joint pain 06/22/2017  . Lung nodules, stable no f/u needed 12/28/2016  . Other chest pain 12/28/2016  . Pituitary adenoma ([place]) 09/08/2016  . Asthma 09/03/2015  . Migraine headache 09/03/2015    Current Outpatient Medications on File Prior to Visit  Medication Sig Dispense Refill  . acetaminophen (TYLENOL) 500 MG tablet Take 1,000 mg by mouth every 8 (eight) hours as needed for mild pain or headache.    . albuterol (VENTOLIN HFA) 108 (90 Base) MCG/ACT inhaler INHALE 2 PUFFS BY MOUTH INTO THE LUNGS EVERY 6 HOURS AS NEEDED FOR WHEEZING OR SHORTNESS OF BREATH. 18 g 0  . Butalbital-APAP-Caffeine 50-300-40 MG CAPS TAKE 1-2 CAPSULES BY MOUTH EVERY 4 (FOUR) HOURS AS NEEDED FOR (HEADACHES). 30 capsule 2  . pantoprazole (PROTONIX) 40 MG tablet Take 1 tablet (40 mg total) by mouth daily. 30 tablet 4  . Prenatal Vit-Fe Fumarate-FA (PRENATAL VITAMIN PO) Take 1 tablet by mouth every evening.     . propranolol (INDERAL) 40 MG tablet Take 2 tablets (80 mg total) by mouth 2 (two) times daily. 120 tablet 0  .  sucralfate (CARAFATE) 1 g tablet Take 1 tablet (1 g total) by mouth 4 (four) times daily. 120 tablet 2   No current facility-administered medications on file prior to visit.    Past Medical History:  Diagnosis Date  . Alpha trait thalassemia   . Anemia   . Asthma   . Headache   . HSV infection   . Migraines   . Positive TB test   . Pregnancy induced hypertension   . Vaginal Pap smear, abnormal     Past Surgical History:  Procedure Laterality Date  . 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 TONSILECTOMY, ADENOIDECTOMY, BILATERAL MYRINGOTOMY AND TUBES    . TONSILLECTOMY     and adenoids  . WISDOM TOOTH EXTRACTION      Social History   Socioeconomic History  . Marital status: Single    Spouse name: Not on file  . Number of children: Not on file  . Years of education: Not on file  . Highest education level: Not on file  Occupational History  . Not on file  Tobacco Use  . Smoking status: Never Smoker  . Smokeless tobacco: Never Used  Substance and Sexual Activity  . Alcohol use: No  . Drug use: No  . Sexual activity: Yes    Birth control/protection: None  Other Topics Concern  . Not on file  Social History Narrative   Works at [employer]   37 months old    Social Determinants of Health  Financial Resource Strain:   . Difficulty of Paying Living Expenses: Not on file  Food Insecurity:   . Worried About Charity fundraiser in the Last Year: Not on file  . Ran Out of Food in the Last Year: Not on file  Transportation Needs:   . Lack of Transportation (Medical): Not on file  . Lack of Transportation (Non-Medical): Not on file  Physical Activity:   . Days of Exercise per Week: Not on file  . Minutes of Exercise per Session: Not on file  Stress:   . Feeling of Stress : Not on file  Social Connections:   . Frequency of Communication with Friends and Family: Not on file  . Frequency of Social Gatherings with Friends and Family: Not on file  . Attends Religious  Services: Not on file  . Active Member of Clubs or Organizations: Not on file  . Attends Archivist Meetings: Not on file  . Marital Status: Not on file    Family History  Problem Relation Age of Onset  . Diabetes Mother   . Hypertension Mother   . Diabetes Father   . Hypertension Father   . Stroke Father   . Hypertension Maternal Grandmother   . Diabetes Maternal Grandmother   . Hypertension Maternal Grandfather   . Diabetes Maternal Grandfather   . Colon cancer Neg Hx   . Esophageal cancer Neg Hx   . Inflammatory bowel disease Neg Hx   . Liver disease Neg Hx   . Pancreatic cancer Neg Hx   . Stomach cancer Neg Hx   . Rectal cancer Neg Hx     Review of Systems     Objective:  There were no vitals filed for this visit. BP Readings from Last 3 Encounters:  08/25/19 (!) 142/84  06/21/19 (!) 144/100  06/15/19 (!) 142/98   Wt Readings from Last 3 Encounters:  08/25/19 121 lb (54.9 kg)  06/21/19 120 lb 6.4 oz (54.6 kg)  06/15/19 116 lb (52.6 kg)   There is no height or weight on file to calculate BMI.   Physical Exam    Constitutional: Appears well-developed and well-nourished. No distress.  HENT:  Head: Normocephalic and atraumatic.  Neck: Neck supple. No tracheal deviation present. No thyromegaly present.  No cervical lymphadenopathy Cardiovascular: Normal rate, regular rhythm and normal heart sounds.   No murmur heard. No carotid bruit .  No edema Pulmonary/Chest: Effort normal and breath sounds normal. No respiratory distress. No has no wheezes. No rales.  Skin: Skin is warm and dry. Not diaphoretic.  Psychiatric: Normal mood and affect. Behavior is normal.      Assessment & Plan:    See Problem List for Assessment and Plan of chronic medical problems.    This visit occurred during the SARS-CoV-2 public health emergency.  Safety protocols were in place, including screening questions prior to the visit, additional usage of staff PPE, and  extensive cleaning of exam room while observing appropriate contact time as indicated for disinfecting solutions.

## 2019-11-10 ENCOUNTER — Ambulatory Visit: Payer: 59 | Admitting: Internal Medicine

## 2019-12-05 ENCOUNTER — Other Ambulatory Visit: Payer: Self-pay | Admitting: Internal Medicine

## 2019-12-22 ENCOUNTER — Other Ambulatory Visit: Payer: Self-pay | Admitting: Gastroenterology

## 2020-01-05 ENCOUNTER — Other Ambulatory Visit: Payer: Self-pay | Admitting: Internal Medicine

## 2020-01-05 NOTE — Telephone Encounter (Signed)
Last routine visit was 11/01/18 I can not find her name in the data base. I thought this was controlled.

## 2020-01-10 ENCOUNTER — Other Ambulatory Visit: Payer: Self-pay | Admitting: Internal Medicine

## 2020-01-12 NOTE — Progress Notes (Signed)
Subjective:    Patient ID: Jasmine Flores, female    DOB: 08/01/1986, 34 y.o.   MRN: [id number]  HPI She is here for a physical exam.   Since she was here last she has a new job, which is much less stressful.  She continues to have frequent migraines.  She can average 16 migraines a month-when she has her menses she has them almost continuously.  She is taking Fioricet as needed.  We did try adding propranolol for her blood pressure and hopefully for prevention of migraines, but she does not think it helped with migraines.  She monitors her BP at home and has been 120-130s/80s.  Medications and allergies reviewed with patient and updated if appropriate.  Patient Active Problem List   Diagnosis Date Noted  . Gastritis without bleeding 08/26/2019  . Hypertension 06/21/2019  . Gastroesophageal reflux disease 04/19/2019  . Abnormal MRI 04/19/2019  . Abnormal findings on diagnostic imaging of abdomen 04/19/2019  . Non-cardiac chest pain 03/26/2019  . Chronic constipation 03/26/2019  . History of gestational hypertension 07/01/2018  . Palpitations 06/22/2017  . 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 Joint pain 06/22/2017  . Lung nodules, stable no f/u needed 12/28/2016  . Pituitary adenoma ([place]) 09/08/2016  . Asthma 09/03/2015  . Migraine headache 09/03/2015    Current Outpatient Medications on File Prior to Visit  Medication Sig Dispense Refill  . acetaminophen (TYLENOL) 500 MG tablet Take 1,000 mg by mouth every 8 (eight) hours as needed for mild pain or headache.    . albuterol (VENTOLIN HFA) 108 (90 Base) MCG/ACT inhaler INHALE 2 PUFFS BY MOUTH INTO THE LUNGS EVERY 6 HOURS AS NEEDED FOR WHEEZING OR SHORTNESS OF BREATH. 18 g 0  . Butalbital-APAP-Caffeine 50-300-40 MG CAPS Take 1-2 capsules by mouth every 4 hours as needed for headaches. Need office visit for more refills. 30 capsule 0  . pantoprazole (PROTONIX) 40 MG tablet Take 1 tablet (40 mg total) by mouth daily. 30 tablet 4    . Prenatal Vit-Fe Fumarate-FA (PRENATAL VITAMIN PO) Take 1 tablet by mouth every evening.     . propranolol (INDERAL) 40 MG tablet TAKE 2 TABLETS (80 MG TOTAL) BY MOUTH 2 (TWO) TIMES DAILY. NEED OFFICE VISIT FOR MORE REFILLS. 120 tablet 0  . sucralfate (CARAFATE) 1 g tablet TAKE 1 TABLET (1 G TOTAL) BY MOUTH 4 TIMES DAILY. 120 tablet 2   No current facility-administered medications on file prior to visit.    Past Medical History:  Diagnosis Date  . Alpha trait thalassemia   . Anemia   . Asthma   . Headache   . HSV infection   . Migraines   . Positive TB test   . Pregnancy induced hypertension   . Vaginal Pap smear, abnormal     Past Surgical History:  Procedure Laterality Date  . 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 TONSILECTOMY, ADENOIDECTOMY, BILATERAL MYRINGOTOMY AND TUBES    . TONSILLECTOMY     and adenoids  . WISDOM TOOTH EXTRACTION      Social History   Socioeconomic History  . Marital status: Single    Spouse name: Not on file  . Number of children: Not on file  . Years of education: Not on file  . Highest education level: Not on file  Occupational History  . Not on file  Tobacco Use  . Smoking status: Never Smoker  . Smokeless tobacco: Never Used  Substance and Sexual Activity  . Alcohol  use: No  . Drug use: No  . Sexual activity: Yes    Birth control/protection: None  Other Topics Concern  . Not on file  Social History Narrative   Works at [employer]   71 months old    Social Determinants of Radio broadcast assistant Strain:   . Difficulty of Paying Living Expenses:   Food Insecurity:   . Worried About Charity fundraiser in the Last Year:   . Arboriculturist in the Last Year:   Transportation Needs:   . Film/video editor (Medical):   Marland Kitchen Lack of Transportation (Non-Medical):   Physical Activity:   . Days of Exercise per Week:   . Minutes of Exercise per Session:   Stress:   . Feeling of Stress :   Social Connections:   . Frequency  of Communication with Friends and Family:   . Frequency of Social Gatherings with Friends and Family:   . Attends Religious Services:   . Active Member of Clubs or Organizations:   . Attends Archivist Meetings:   Marland Kitchen Marital Status:     Family History  Problem Relation Age of Onset  . Diabetes Mother   . Hypertension Mother   . Diabetes Father   . Hypertension Father   . Stroke Father   . Hypertension Maternal Grandmother   . Diabetes Maternal Grandmother   . Hypertension Maternal Grandfather   . Diabetes Maternal Grandfather   . Colon cancer Neg Hx   . Esophageal cancer Neg Hx   . Inflammatory bowel disease Neg Hx   . Liver disease Neg Hx   . Pancreatic cancer Neg Hx   . Stomach cancer Neg Hx   . Rectal cancer Neg Hx     Review of Systems  Constitutional: Negative for chills and fever.  Eyes: Negative for visual disturbance.  Respiratory: Negative for cough, shortness of breath and wheezing.   Cardiovascular: Negative for chest pain, palpitations and leg swelling.  Gastrointestinal: Positive for nausea (with migraines). Negative for abdominal pain, blood in stool, constipation and diarrhea.       Gerd controlled  Genitourinary: Negative for dysuria and hematuria.  Musculoskeletal: Positive for back pain (lower back intermittently). Negative for arthralgias.  Skin: Negative for color change and rash.  Neurological: Positive for headaches. Negative for dizziness and light-headedness.  Psychiatric/Behavioral: Negative for dysphoric mood. The patient is not nervous/anxious.        Objective:   Vitals:   01/13/20 0841  BP: 122/90  Pulse: 72  Temp: 97.9 F (36.6 C)  SpO2: 98%   Filed Weights   01/13/20 0841  Weight: 122 lb (55.3 kg)   Body mass index is 20.3 kg/m.  BP Readings from Last 3 Encounters:  01/13/20 122/90  08/25/19 (!) 142/84  06/21/19 (!) 144/100    Wt Readings from Last 3 Encounters:  01/13/20 122 lb (55.3 kg)  08/25/19 121 lb  (54.9 kg)  06/21/19 120 lb 6.4 oz (54.6 kg)     Physical Exam Constitutional: She appears well-developed and well-nourished. No distress.  HENT:  Head: Normocephalic and atraumatic.  Right Ear: External ear normal. Normal ear canal and TM Left Ear: External ear normal.  Normal ear canal and TM Mouth/Throat: Oropharynx is clear and moist.  Eyes: Conjunctivae and EOM are normal.  Neck: Neck supple. No tracheal deviation present. No thyromegaly present.  No carotid bruit  Cardiovascular: Normal rate, regular rhythm and normal heart sounds.   No murmur  heard.  No edema. Pulmonary/Chest: Effort normal and breath sounds normal. No respiratory distress. She has no wheezes. She has no rales.  Breast: deferred   Abdominal: Soft. She exhibits no distension. There is no tenderness.  Lymphadenopathy: She has no cervical adenopathy.  Skin: Skin is warm and dry. She is not diaphoretic.  Psychiatric: She has a normal mood and affect. Her behavior is normal.        Assessment & Plan:   Physical exam: Screening blood work    ordered Immunizations  Up to date  Gyn  Up to date   Exercise  Not regular Weight  Normal BMI Substance abuse  none  See Problem List for Assessment and Plan of chronic medical problems.      This visit occurred during the SARS-CoV-2 public health emergency.  Safety protocols were in place, including screening questions prior to the visit, additional usage of staff PPE, and extensive cleaning of exam room while observing appropriate contact time as indicated for disinfecting solutions.

## 2020-01-12 NOTE — Patient Instructions (Addendum)
Think about Aimovig for your migraines.    Blood work was ordered.    All other Health Maintenance issues reviewed.   All recommended immunizations and age-appropriate screenings are up-to-date or discussed.  No immunization administered today.   Medications reviewed and updated.  Changes include :  zofran as needed   Your prescription(s) have been submitted to your pharmacy. Please take as directed and contact our office if you believe you are having problem(s) with the medication(s).  A referral was ordered for neurology   Please followup in 1 year    Health Maintenance, Female Adopting a healthy lifestyle and getting preventive care are important in promoting health and wellness. Ask your health care provider about:  The right schedule for you to have regular tests and exams.  Things you can do on your own to prevent diseases and keep yourself healthy. What should I know about diet, weight, and exercise? Eat a healthy diet   Eat a diet that includes plenty of vegetables, fruits, low-fat dairy products, and lean protein.  Do not eat a lot of foods that are high in solid fats, added sugars, or sodium. Maintain a healthy weight Body mass index (BMI) is used to identify weight problems. It estimates body fat based on height and weight. Your health care provider can help determine your BMI and help you achieve or maintain a healthy weight. Get regular exercise Get regular exercise. This is one of the most important things you can do for your health. Most adults should:  Exercise for at least 150 minutes each week. The exercise should increase your heart rate and make you sweat (moderate-intensity exercise).  Do strengthening exercises at least twice a week. This is in addition to the moderate-intensity exercise.  Spend less time sitting. Even light physical activity can be beneficial. Watch cholesterol and blood lipids Have your blood tested for lipids and cholesterol at 36  years of age, then have this test every 5 years. Have your cholesterol levels checked more often if:  Your lipid or cholesterol levels are high.  You are older than 36 years of age.  You are at high risk for heart disease. What should I know about cancer screening? Depending on your health history and family history, you may need to have cancer screening at various ages. This may include screening for:  Breast cancer.  Cervical cancer.  Colorectal cancer.  Skin cancer.  Lung cancer. What should I know about heart disease, diabetes, and high blood pressure? Blood pressure and heart disease  High blood pressure causes heart disease and increases the risk of stroke. This is more likely to develop in people who have high blood pressure readings, are of African descent, or are overweight.  Have your blood pressure checked: ? Every 3-5 years if you are 45-59 years of age. ? Every year if you are 13 years old or older. Diabetes Have regular diabetes screenings. This checks your fasting blood sugar level. Have the screening done:  Once every three years after age 81 if you are at a normal weight and have a low risk for diabetes.  More often and at a younger age if you are overweight or have a high risk for diabetes. What should I know about preventing infection? Hepatitis B If you have a higher risk for hepatitis B, you should be screened for this virus. Talk with your health care provider to find out if you are at risk for hepatitis B infection. Hepatitis C Testing is  recommended for:  Everyone born from [birth year] through 1967.  Anyone with known risk factors for hepatitis C. Sexually transmitted infections (STIs)  Get screened for STIs, including gonorrhea and chlamydia, if: ? You are sexually active and are younger than 36 years of age. ? You are older than 36 years of age and your health care provider tells you that you are at risk for this type of infection. ? Your sexual  activity has changed since you were last screened, and you are at increased risk for chlamydia or gonorrhea. Ask your health care provider if you are at risk.  Ask your health care provider about whether you are at high risk for HIV. Your health care provider may recommend a prescription medicine to help prevent HIV infection. If you choose to take medicine to prevent HIV, you should first get tested for HIV. You should then be tested every 3 months for as long as you are taking the medicine. Pregnancy  If you are about to stop having your period (premenopausal) and you may become pregnant, seek counseling before you get pregnant.  Take 400 to 800 micrograms (mcg) of folic acid every day if you become pregnant.  Ask for birth control (contraception) if you want to prevent pregnancy. Osteoporosis and menopause Osteoporosis is a disease in which the bones lose minerals and strength with aging. This can result in bone fractures. If you are 19 years old or older, or if you are at risk for osteoporosis and fractures, ask your health care provider if you should:  Be screened for bone loss.  Take a calcium or vitamin D supplement to lower your risk of fractures.  Be given hormone replacement therapy (HRT) to treat symptoms of menopause. Follow these instructions at home: Lifestyle  Do not use any products that contain nicotine or tobacco, such as cigarettes, e-cigarettes, and chewing tobacco. If you need help quitting, ask your health care provider.  Do not use street drugs.  Do not share needles.  Ask your health care provider for help if you need support or information about quitting drugs. Alcohol use  Do not drink alcohol if: ? Your health care provider tells you not to drink. ? You are pregnant, may be pregnant, or are planning to become pregnant.  If you drink alcohol: ? Limit how much you use to 0-1 drink a day. ? Limit intake if you are breastfeeding.  Be aware of how much  alcohol is in your drink. In the [place] one drink equals one 12 oz bottle of beer (355 mL), one 5 oz glass of wine (148 mL), or one 1 oz glass of hard liquor (44 mL). General instructions  Schedule regular health, dental, and eye exams.  Stay current with your vaccines.  Tell your health care provider if: ? You often feel depressed. ? You have ever been abused or do not feel safe at home. Summary  Adopting a healthy lifestyle and getting preventive care are important in promoting health and wellness.  Follow your health care provider's instructions about healthy diet, exercising, and getting tested or screened for diseases.  Follow your health care provider's instructions on monitoring your cholesterol and blood pressure. This information is not intended to replace advice given to you by your health care provider. Make sure you discuss any questions you have with your health care provider. Document Revised: 09/15/2018 Document Reviewed: 09/15/2018 Elsevier Patient Education  2020 [employer].

## 2020-01-13 ENCOUNTER — Encounter: Payer: Self-pay | Admitting: Internal Medicine

## 2020-01-13 ENCOUNTER — Ambulatory Visit (INDEPENDENT_AMBULATORY_CARE_PROVIDER_SITE_OTHER): Payer: 59 | Admitting: Internal Medicine

## 2020-01-13 ENCOUNTER — Other Ambulatory Visit: Payer: Self-pay

## 2020-01-13 VITALS — BP 122/90 | HR 72 | Temp 97.9°F | Ht 65.0 in | Wt 122.0 lb

## 2020-01-13 DIAGNOSIS — Z0000 Encounter for general adult medical examination without abnormal findings: Secondary | ICD-10-CM | POA: Diagnosis not present

## 2020-01-13 DIAGNOSIS — K219 Gastro-esophageal reflux disease without esophagitis: Secondary | ICD-10-CM

## 2020-01-13 DIAGNOSIS — I10 Essential (primary) hypertension: Secondary | ICD-10-CM | POA: Diagnosis not present

## 2020-01-13 DIAGNOSIS — J4520 Mild intermittent asthma, uncomplicated: Secondary | ICD-10-CM | POA: Diagnosis not present

## 2020-01-13 DIAGNOSIS — G43009 Migraine without aura, not intractable, without status migrainosus: Secondary | ICD-10-CM | POA: Diagnosis not present

## 2020-01-13 LAB — CBC WITH DIFFERENTIAL/PLATELET
Basophils Absolute: 0 10*3/uL (ref 0.0–0.1)
Basophils Relative: 0.7 % (ref 0.0–3.0)
Eosinophils Absolute: 0.3 10*3/uL (ref 0.0–0.7)
Eosinophils Relative: 7.3 % — ABNORMAL HIGH (ref 0.0–5.0)
HCT: 33.8 % — ABNORMAL LOW (ref 36.0–46.0)
Hemoglobin: 11 g/dL — ABNORMAL LOW (ref 12.0–15.0)
Lymphocytes Relative: 31.7 % (ref 12.0–46.0)
Lymphs Abs: 1.3 10*3/uL (ref 0.7–4.0)
MCHC: 32.5 g/dL (ref 30.0–36.0)
MCV: 75.9 fl — ABNORMAL LOW (ref 78.0–100.0)
Monocytes Absolute: 0.4 10*3/uL (ref 0.1–1.0)
Monocytes Relative: 9.2 % (ref 3.0–12.0)
Neutro Abs: 2.1 10*3/uL (ref 1.4–7.7)
Neutrophils Relative %: 51.1 % (ref 43.0–77.0)
Platelets: 299 10*3/uL (ref 150.0–400.0)
RBC: 4.46 Mil/uL (ref 3.87–5.11)
RDW: 14.4 % (ref 11.5–15.5)
WBC: 4.1 10*3/uL (ref 4.0–10.5)

## 2020-01-13 LAB — COMPREHENSIVE METABOLIC PANEL
ALT: 14 U/L (ref 0–35)
AST: 15 U/L (ref 0–37)
Albumin: 4.2 g/dL (ref 3.5–5.2)
Alkaline Phosphatase: 65 U/L (ref 39–117)
BUN: 22 mg/dL (ref 6–23)
CO2: 31 mEq/L (ref 19–32)
Calcium: 8.9 mg/dL (ref 8.4–10.5)
Chloride: 102 mEq/L (ref 96–112)
Creatinine, Ser: 0.62 mg/dL (ref 0.40–1.20)
GFR: 131.71 mL/min (ref >60.00)
Glucose, Bld: 88 mg/dL (ref 70–99)
Potassium: 4.5 mEq/L (ref 3.5–5.1)
Sodium: 138 mEq/L (ref 135–145)
Total Bilirubin: 0.4 mg/dL (ref 0.2–1.2)
Total Protein: 7 g/dL (ref 6.0–8.3)

## 2020-01-13 LAB — LIPID PANEL
Cholesterol: 154 mg/dL (ref 0–200)
HDL: 58.6 mg/dL (ref >39.00)
LDL Cholesterol: 79 mg/dL (ref 0–99)
NonHDL: 95.48
Total CHOL/HDL Ratio: 3
Triglycerides: 80 mg/dL (ref 0.0–149.0)
VLDL: 16 mg/dL (ref 0.0–40.0)

## 2020-01-13 LAB — TSH: TSH: 0.65 u[IU]/mL (ref 0.35–4.50)

## 2020-01-13 MED ORDER — ONDANSETRON 4 MG PO TBDP: 4.0000 mg | ORAL_TABLET | Freq: Three times a day (TID) | ORAL | 3 refills | Status: DC | PRN

## 2020-01-13 NOTE — Assessment & Plan Note (Signed)
Chronic History of nonbleeding erosive gastritis on EGD 06/2019 Taking Protonix daily GERD controlled Continue

## 2020-01-13 NOTE — Assessment & Plan Note (Signed)
Chronic Blood pressure well controlled at home Discussed possibly increasing propranolol to see if it would help with the migraines and keep her diastolic out of the 0000000, but she feels most of her diastolic numbers have been in the 80s She will continue to monitor at home Continue propranolol for now-if her migraines are controlled with a different migraine medication we can consider changing propranolol to a first-line hypertensive medication CMP

## 2020-01-13 NOTE — Assessment & Plan Note (Addendum)
Chronic Having 16 migraines a month - has them back to back with menses She usually wakes up with them Taking propranolol - not helping much-discussed possibly increasing it, but she would like to hold off for now Discussed Topamax, nortriptyline, Aimovig-she is hesitant She is interested in seeing a neurologist-we will refer Continue Fioricet as needed She does get nausea with her migraines-Zofran sent to pharmacy to use as needed

## 2020-02-08 ENCOUNTER — Other Ambulatory Visit: Payer: Self-pay | Admitting: Internal Medicine

## 2020-02-09 NOTE — Telephone Encounter (Signed)
Last OV 01/13/20

## 2020-02-14 ENCOUNTER — Other Ambulatory Visit: Payer: Self-pay | Admitting: Internal Medicine

## 2020-02-20 ENCOUNTER — Encounter: Payer: Self-pay | Admitting: Neurology

## 2020-02-20 ENCOUNTER — Ambulatory Visit: Payer: 59 | Admitting: Neurology

## 2020-02-20 ENCOUNTER — Other Ambulatory Visit: Payer: Self-pay

## 2020-02-20 ENCOUNTER — Other Ambulatory Visit: Payer: Self-pay | Admitting: Neurology

## 2020-02-20 VITALS — BP 122/87 | HR 67 | Temp 97.1°F | Ht 65.0 in | Wt 121.0 lb

## 2020-02-20 DIAGNOSIS — R51 Headache with orthostatic component, not elsewhere classified: Secondary | ICD-10-CM

## 2020-02-20 DIAGNOSIS — R519 Headache, unspecified: Secondary | ICD-10-CM | POA: Diagnosis not present

## 2020-02-20 DIAGNOSIS — D352 Benign neoplasm of pituitary gland: Secondary | ICD-10-CM | POA: Diagnosis not present

## 2020-02-20 DIAGNOSIS — G4440 Drug-induced headache, not elsewhere classified, not intractable: Secondary | ICD-10-CM

## 2020-02-20 DIAGNOSIS — G43709 Chronic migraine without aura, not intractable, without status migrainosus: Secondary | ICD-10-CM | POA: Diagnosis not present

## 2020-02-20 MED ORDER — AJOVY 225 MG/1.5ML ~~LOC~~ SOAJ: 225.0000 mg | SUBCUTANEOUS | 11 refills | Status: DC

## 2020-02-20 MED ORDER — RIZATRIPTAN BENZOATE 10 MG PO TBDP: 10.0000 mg | ORAL_TABLET | ORAL | 11 refills | Status: DC | PRN

## 2020-02-20 NOTE — Progress Notes (Addendum)
[id number]    Provider:  Dr Jaynee Eagles Requesting Provider: Binnie Rail, MD Primary Care Provider:  Binnie Rail, MD  CC:  Migraines  HPI:  Jasmine Flores is a 34 y.o. female here as requested by Binnie Rail, MD for migraines.  I reviewed Dr. Quay Burow notes: Patient is having 16 migraines a month, worse during her menses), she usually wakes up with renal colic much, we discussed Topamax nortriptyline Aimovig, she is on Fioricet for acute management and Zofran for nausea with her migraines.  Blood pressures are well controlled.  She was continued on.  At prior appointments she also discussed migraines, reporting at least 4/week, she decreased her Excedrin Migraine because of her gastritis, she was started on propranolol in September 2022 also help with blood pressure as well as migraine prophylaxis.  They also discussed Imitrex or Zomig/Maxalt if needed.  She is here alone and reports migraines diagnosed in her 8a, she tried topamax, nortriptyline, propranolol. Stopped taking excedrin or OTC medications, her stomach getting gastritis. Migraines have been worsening since 2019, after her second child, also in the setting of stress. Worse during the menses, the week before that is the worst. She usually wakes up with them every day, skipping meals can trigger, unilateral and usually start on the right and spreads, pounding/pulsating/throbbing, nausea, vomiting, photo/phonophobia, turnig off the light helps, movement makes it worse and has to be still if bending over it makes it worse and keeps getting worse. Daily headaches and more than 15 migraine days that are moderately severe to severe for > 1 year. She is  A light sleeper but no snoring and no significant fatigue during the day or dry mouth in the morning, she does have young kids and she doesn't have a night where she doesn't sleep throughout the night. She is using condoms and no pregnancy plans. No other focal  neurologic deficits, associated symptoms, inciting events or modifiable factors.  Reviewed notes, labs and imaging from outside physicians, which showed:  MRi brain 2017: reviewed images, agree with the following  Postop pituitary resection. No prior studies available. Complex appearance of the sella. 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 The pituitary appears to be up lifted. There is low signal material in the anterior sella which may be graft material from prior trans-sphenoidal resection. Less likely but possible is flow void related to aneurysm or vascular abnormality or gas. For this reason, CTA head is recommended for further evaluation to rule out aneurysm and evaluate the wall of the sphenoid sinus.   Reviewed CTA report only, as below:  IMPRESSION: 1. The unusual low T1 signal area along the anterior sella turcica described on the recent MRI corresponds to a 12 mm mildly lobulated focus of extremely hyperdense material which I favor is either operative glue or cement. This does abut the right ICA siphon, but there is no associated aneurysm or ICA stenosis. 2. Otherwise negative intracranial CTA, and normal CT appearance of the brain. TSH, CMP normal 01/13/2020  Review of Systems: Patient complains of symptoms per HPI as well as the following symptoms: headache, fatigue Pertinent negatives and positives per HPI. All others negative.   Social History   Socioeconomic History  . Marital status: Single    Spouse name: Not on file  . Number of children: 2  . Years of education: Not on file  . Highest education level: Master's degree (  e.g., MA, MS, MEng, MEd, MSW, MBA)  Occupational History  . Not on file  Tobacco Use  . Smoking status: Never Smoker  . Smokeless tobacco: Never Used  Substance and Sexual Activity  . Alcohol use: No  . Drug use: No  . Sexual activity: Yes    Birth  control/protection: None  Other Topics Concern  . Not on file  Social History Narrative   Works for [employer] at home with her fiance and her children   Right handed   Caffeine: has one mini can of soda/day   Social Determinants of Radio broadcast assistant Strain:   . Difficulty of Paying Living Expenses:   Food Insecurity:   . Worried About Charity fundraiser in the Last Year:   . Arboriculturist in the Last Year:   Transportation Needs:   . Film/video editor (Medical):   Marland Kitchen Lack of Transportation (Non-Medical):   Physical Activity:   . Days of Exercise per Week:   . Minutes of Exercise per Session:   Stress:   . Feeling of Stress :   Social Connections:   . Frequency of Communication with Friends and Family:   . Frequency of Social Gatherings with Friends and Family:   . Attends Religious Services:   . Active Member of Clubs or Organizations:   . Attends Archivist Meetings:   Marland Kitchen Marital Status:   Intimate Partner Violence:   . Fear of Current or Ex-Partner:   . Emotionally Abused:   Marland Kitchen Physically Abused:   . Sexually Abused:     Family History  Problem Relation Age of Onset  . Diabetes Mother   . Hypertension Mother   . Diabetes Father   . Hypertension Father   . Stroke Father   . Hypertension Maternal Grandmother   . Diabetes Maternal Grandmother   . Hypertension Maternal Grandfather   . Diabetes Maternal Grandfather   . Colon cancer Neg Hx   . Esophageal cancer Neg Hx   . Inflammatory bowel disease Neg Hx   . Liver disease Neg Hx   . Pancreatic cancer Neg Hx   . Stomach cancer Neg Hx   . Rectal cancer Neg Hx   . Migraines Neg Hx     Past Medical History:  Diagnosis Date  . Alpha trait thalassemia   . Anemia   . Asthma   . Headache   . HSV infection   . Migraines   . Positive TB test   . Pregnancy induced hypertension   . Vaginal Pap smear, abnormal     Patient Active Problem List   Diagnosis Date Noted  .  Gastritis without bleeding 08/26/2019  . Hypertension 06/21/2019  . Gastroesophageal reflux disease 04/19/2019  . Abnormal MRI 04/19/2019  . Abnormal findings on diagnostic imaging of abdomen 04/19/2019  . Non-cardiac chest pain 03/26/2019  . Chronic constipation 03/26/2019  . History of gestational hypertension 07/01/2018  . Palpitations 06/22/2017  . 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 Joint pain 06/22/2017  . Lung nodules, stable no f/u needed 12/28/2016  . Pituitary adenoma ([place]) 09/08/2016  . Asthma 09/03/2015  . Migraine headache 09/03/2015    Past Surgical History:  Procedure Laterality Date  . 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 TONSILECTOMY, ADENOIDECTOMY, BILATERAL MYRINGOTOMY AND TUBES    . TONSILLECTOMY     and adenoids  . WISDOM TOOTH EXTRACTION      Current Outpatient Medications  Medication Sig Dispense Refill  . albuterol (VENTOLIN HFA) 108 (90 Base) MCG/ACT inhaler INHALE 2 PUFFS BY MOUTH INTO THE LUNGS EVERY 6 HOURS AS NEEDED FOR WHEEZING OR SHORTNESS OF BREATH. 18 g 0  . Butalbital-APAP-Caffeine 50-300-40 MG CAPS TAKE 1-2 CAPSULES BY MOUTH EVERY 4 HOURS AS NEEDED FOR HEADACHES. NEED OFFICE VISIT FOR MORE REFILLS. 30 capsule 0  . ondansetron (ZOFRAN ODT) 4 MG disintegrating tablet Take 1 tablet (4 mg total) by mouth every 8 (eight) hours as needed (for nausea associated with migraines). 30 tablet 3  . pantoprazole (PROTONIX) 40 MG tablet Take 1 tablet (40 mg total) by mouth daily. 30 tablet 4  . Prenatal Vit-Fe Fumarate-FA (PRENATAL VITAMIN PO) Take 1 tablet by mouth every evening.     . propranolol (INDERAL) 40 MG tablet Take 2 tablets (80 mg total) by mouth 2 (two) times daily. 120 tablet 5  . sucralfate (CARAFATE) 1 g tablet TAKE 1 TABLET (1 G TOTAL) BY MOUTH 4 TIMES DAILY. 120 tablet 2  . Fremanezumab-vfrm (AJOVY) 225 MG/1.5ML SOAJ Inject 225 mg into the skin every 30 (thirty) days. 1 pen 11  . rizatriptan (MAXALT-MLT) 10 MG disintegrating tablet Take 1 tablet  (10 mg total) by mouth as needed for migraine. May repeat in 2 hours if needed 9 tablet 11   No current facility-administered medications for this visit.    Allergies as of 02/20/2020 - Review Complete 02/20/2020  Allergen Reaction Noted  . Shellfish allergy Itching, Swelling, Shortness Of Breath, and Anaphylaxis 11/13/2014  . Sulfa antibiotics Itching and Swelling 11/13/2014    Vitals: BP 122/87 (BP Location: Right Arm, Patient Position: Sitting)   Pulse 67   Temp (!) 97.1 F (36.2 C) Comment: taken at front  Ht 5\' 5"  (1.651 m)   Wt 121 lb (54.9 kg)   Breastfeeding No   BMI 20.14 kg/m  Last Weight:  Wt Readings from Last 1 Encounters:  02/20/20 121 lb (54.9 kg)   Last Height:   Ht Readings from Last 1 Encounters:  02/20/20 5\' 5"  (1.651 m)     Physical exam: Exam: Gen: NAD, conversant, well nourised, obese, well groomed                     CV: RRR, no MRG. No Carotid Bruits. No peripheral edema, warm, nontender Eyes: Conjunctivae clear without exudates or hemorrhage  Neuro: Detailed Neurologic Exam  Speech:    Speech is normal; fluent and spontaneous with normal comprehension.  Cognition:    The patient is oriented to person, place, and time;     recent and remote memory intact;     language fluent;     normal attention, concentration,     fund of knowledge Cranial Nerves:    The pupils are equal, round, and reactive to light. The fundi are normal and spontaneous venous pulsations are present. Visual fields are full to finger confrontation. Extraocular movements are intact. Trigeminal sensation is intact and the muscles of mastication are normal. The face is symmetric. The palate elevates in the midline. Hearing intact. Voice is normal. Shoulder shrug is normal. The tongue has normal motion without fasciculations.   Coordination:    Normal finger to nose and heel to shin. Normal rapid alternating movements.   Gait:    Heel-toe and tandem gait are normal.    Motor Observation:    No asymmetry, no atrophy, and no involuntary movements noted. Tone:    Normal muscle tone.    Posture:  Posture is normal. normal erect    Strength:    Strength is V/V in the upper and lower limbs.      Sensation: intact to LT     Reflex Exam:  DTR's:    Deep tendon reflexes in the upper and lower extremities are normal bilaterally.   Toes:    The toes are downgoing bilaterally.   Clonus:    Clonus is absent.    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 They only thing to do is to stop the medication unfortunately. In the timeframe after stopping at her headaches may get much worse.  Do not use these medications more than 2 times in a week.  Migraines: Discussed migraine management including acute management and preventative management. We'll start patient on both. Discussed Topamax side effects especially teratogenicity do not get pregnant and use birth control.  MRI brain due to concerning symptoms of morning headaches, positional headaches,vision changes, pituitary adenoma to look for space occupying mass, chiari or intracranial hypertension (pseudotumor).  Limit Fioricet to < 5 days a month, discussed medication rebound and oversue Try Maxalt for acute management She has tried and failed nortriptyline, topamax, propranolol and doesn't want another pill we can try Ajovy for preventative    Orders Placed This Encounter  Procedures  . MR BRAIN W WO CONTRAST   Meds ordered this encounter  Medications  . Fremanezumab-vfrm (AJOVY) 225 MG/1.5ML SOAJ    Sig: Inject 225 mg into the skin every 30 (thirty) days.    Dispense:  1 pen    Refill:  11  . rizatriptan (MAXALT-MLT) 10 MG disintegrating tablet    Sig: Take 1 tablet (10 mg total) by mouth as needed for migraine. May repeat in 2 hours if needed    Dispense:  9 tablet    Refill:  11     Discussed: To prevent or relieve headaches, try the following: Cool Compress. Lie down and place a cool compress on your head.  Avoid headache triggers. If certain foods or odors seem to have triggered your migraines in the past, avoid them. A headache diary might help you identify triggers.  Include physical activity in your daily routine. Try a daily walk or other moderate aerobic exercise.  Manage stress. Find healthy ways to cope with the stressors, such as delegating tasks on your to-do list.  Practice relaxation techniques. Try deep breathing, yoga, massage and visualization.  Eat regularly. Eating regularly scheduled meals and maintaining a healthy diet might help prevent headaches. Also, drink plenty of fluids.  Follow a regular sleep schedule. Sleep deprivation might contribute to headaches Consider biofeedback. With this mind-body technique, you learn to control certain bodily functions - such as muscle tension, heart rate and blood pressure - to prevent headaches or reduce headache pain.    Proceed to emergency room if you experience new or worsening symptoms or symptoms do not resolve, if you have new neurologic symptoms or if headache is severe, or for any concerning symptom.   Provided education and documentation from American headache Society toolbox including articles on: chronic migraine medication overuse headache, chronic migraines, prevention of migraines, behavioral and other nonpharmacologic treatments for headache.  Cc: Binnie Rail, MD,    Sarina Ill, MD  [facility] [address] [place], [place] [address]  Phone [phone] Fax [phone]

## 2020-02-20 NOTE — Patient Instructions (Signed)
Start Ajovy for preventative Start Maxalt/Riztriptan: Please take one tablet at the onset of your headache. If it does not improve the symptoms please take one additional tablet. Do not take more then 2 tablets in 24hrs. Do not take use more then 2 to 3 times in a week. Fioricet < 5 times a month Take the maxalt/Riztriptan with zofran/ondansetron  Rizatriptan disintegrating tablets What is this medicine? RIZATRIPTAN (rye za TRIP tan) is used to treat migraines with or without aura. An aura is a strange feeling or visual disturbance that warns you of an attack. It is not used to prevent migraines. This medicine may be used for other purposes; ask your health care provider or pharmacist if you have questions. COMMON BRAND NAME(S): Maxalt-MLT What should I tell my health care provider before I take this medicine? They need to know if you have any of these conditions:  cigarette smoker  circulation problems in fingers and toes  diabetes  heart disease  high blood pressure  high cholesterol  history of irregular heartbeat  history of stroke  kidney disease  liver disease  stomach or intestine problems  an unusual or allergic reaction to rizatriptan, other medicines, foods, dyes, or preservatives  pregnant or trying to get pregnant  breast-feeding How should I use this medicine? Take this medicine by mouth. Follow the directions on the prescription label. Leave the tablet in the sealed blister pack until you are ready to take it. With dry hands, open the blister and gently remove the tablet. If the tablet breaks or crumbles, throw it away and take a new tablet out of the blister pack. Place the tablet in the mouth and allow it to dissolve, and then swallow. Do not cut, crush, or chew this medicine. You do not need water to take this medicine. Do not take it more often than directed. Talk to your pediatrician regarding the use of this medicine in children. While this drug may be  prescribed for children as young as 6 years for selected conditions, precautions do apply. Overdosage: If you think you have taken too much of this medicine contact a poison control center or emergency room at once. NOTE: This medicine is only for you. Do not share this medicine with others. What if I miss a dose? This does not apply. This medicine is not for regular use. What may interact with this medicine? Do not take this medicine with any of the following medicines:  certain medicines for migraine headache like almotriptan, eletriptan, frovatriptan, naratriptan, rizatriptan, sumatriptan, zolmitriptan  ergot alkaloids like dihydroergotamine, ergonovine, ergotamine, methylergonovine  MAOIs like Carbex, Eldepryl, Marplan, Nardil, and Parnate This medicine may also interact with the following medications:  certain medicines for depression, anxiety, or psychotic disorders  propranolol This list may not describe all possible interactions. Give your health care provider a list of all the medicines, herbs, non-prescription drugs, or dietary supplements you use. Also tell them if you smoke, drink alcohol, or use illegal drugs. Some items may interact with your medicine. What should I watch for while using this medicine? Visit your healthcare professional for regular checks on your progress. Tell your healthcare professional if your symptoms do not start to get better or if they get worse. You may get drowsy or dizzy. Do not drive, use machinery, or do anything that needs mental alertness until you know how this medicine affects you. Do not stand up or sit up quickly, especially if you are an older patient. This reduces the risk  of dizzy or fainting spells. Alcohol may interfere with the effect of this medicine. Your mouth may get dry. Chewing sugarless gum or sucking hard candy and drinking plenty of water may help. Contact your healthcare professional if the problem does not go away or is  severe. If you take migraine medicines for 10 or more days a month, your migraines may get worse. Keep a diary of headache days and medicine use. Contact your healthcare professional if your migraine attacks occur more frequently. What side effects may I notice from receiving this medicine? Side effects that you should report to your doctor or health care professional as soon as possible:  allergic reactions like skin rash, itching or hives, swelling of the face, lips, or tongue  chest pain or chest tightness  signs and symptoms of a dangerous change in heartbeat or heart rhythm like chest pain; dizziness; fast, irregular heartbeat; palpitations; feeling faint or lightheaded; falls; breathing problems  signs and symptoms of a stroke like changes in vision; confusion; trouble speaking or understanding; severe headaches; sudden numbness or weakness of the face, arm or leg; trouble walking; dizziness; loss of balance or coordination  signs and symptoms of serotonin syndrome like irritable; confusion; diarrhea; fast or irregular heartbeat; muscle twitching; stiff muscles; trouble walking; sweating; high fever; seizures; chills; vomiting Side effects that usually do not require medical attention (report to your doctor or health care professional if they continue or are bothersome):  diarrhea  dizziness  drowsiness  dry mouth  headache  nausea, vomiting  pain, tingling, numbness in the hands or feet  stomach pain This list may not describe all possible side effects. Call your doctor for medical advice about side effects. You may report side effects to FDA at 1-800-FDA-1088. Where should I keep my medicine? Keep out of the reach of children. Store at room temperature between 15 and 30 degrees C (59 and 86 degrees F). Protect from light and moisture. Throw away any unused medicine after the expiration date. NOTE: This sheet is a summary. It may not cover all possible information. If you  have questions about this medicine, talk to your doctor, pharmacist, or health care provider.  2020 Elsevier/Gold Standard (2018-04-06 14:58:08)   Rolanda Lundborg injection What is this medicine? FREMANEZUMAB (fre ma NEZ ue mab) is used to prevent migraine headaches. This medicine may be used for other purposes; ask your health care provider or pharmacist if you have questions. COMMON BRAND NAME(S): AJOVY What should I tell my health care provider before I take this medicine? They need to know if you have any of these conditions:  an unusual or allergic reaction to fremanezumab, other medicines, foods, dyes, or preservatives  pregnant or trying to get pregnant  breast-feeding How should I use this medicine? This medicine is for injection under the skin. You will be taught how to prepare and give this medicine. Use exactly as directed. Take your medicine at regular intervals. Do not take your medicine more often than directed. It is important that you put your used needles and syringes in a special sharps container. Do not put them in a trash can. If you do not have a sharps container, call your pharmacist or healthcare provider to get one. Talk to your pediatrician regarding the use of this medicine in children. Special care may be needed. Overdosage: If you think you have taken too much of this medicine contact a poison control center or emergency room at once. NOTE: This medicine is only for you.  Do not share this medicine with others. What if I miss a dose? If you miss a dose, take it as soon as you can. If it is almost time for your next dose, take only that dose. Do not take double or extra doses. What may interact with this medicine? Interactions are not expected. This list may not describe all possible interactions. Give your health care provider a list of all the medicines, herbs, non-prescription drugs, or dietary supplements you use. Also tell them if you smoke, drink alcohol, or  use illegal drugs. Some items may interact with your medicine. What should I watch for while using this medicine? Tell your doctor or healthcare professional if your symptoms do not start to get better or if they get worse. What side effects may I notice from receiving this medicine? Side effects that you should report to your doctor or health care professional as soon as possible:  allergic reactions like skin rash, itching or hives, swelling of the face, lips, or tongue Side effects that usually do not require medical attention (report these to your doctor or health care professional if they continue or are bothersome):  pain, redness, or irritation at site where injected This list may not describe all possible side effects. Call your doctor for medical advice about side effects. You may report side effects to FDA at 1-800-FDA-1088. Where should I keep my medicine? Keep out of the reach of children. You will be instructed on how to store this medicine. Throw away any unused medicine after the expiration date on the label. NOTE: This sheet is a summary. It may not cover all possible information. If you have questions about this medicine, talk to your doctor, pharmacist, or health care provider.  2020 Elsevier/Gold Standard (2017-06-22 17:22:56)

## 2020-02-21 ENCOUNTER — Ambulatory Visit: Payer: Self-pay | Admitting: Neurology

## 2020-02-21 ENCOUNTER — Telehealth: Payer: Self-pay | Admitting: *Deleted

## 2020-02-21 NOTE — Telephone Encounter (Signed)
I received a denial letter from [place]. The Ajovy is not a covered benefit and is excluded from coverage. If we should choose to appeal, fax to [phone]. Reference # [id number].

## 2020-02-21 NOTE — Telephone Encounter (Signed)
Completed Ajovy PA on Cover My Meds. [id number]. Awaiting determination from [facility].

## 2020-02-27 ENCOUNTER — Telehealth: Payer: Self-pay | Admitting: Neurology

## 2020-02-27 NOTE — Telephone Encounter (Signed)
LVM for pt to call back about scheduling [facility] Auth: [id number] (exp. 02/27/20 to 04/12/20)

## 2020-03-06 NOTE — Telephone Encounter (Signed)
Spoke to the patient she is scheduled at [facility] for 03/07/20.

## 2020-03-07 ENCOUNTER — Ambulatory Visit: Payer: 59

## 2020-03-07 ENCOUNTER — Other Ambulatory Visit: Payer: Self-pay

## 2020-03-07 DIAGNOSIS — D352 Benign neoplasm of pituitary gland: Secondary | ICD-10-CM

## 2020-03-07 DIAGNOSIS — R519 Headache, unspecified: Secondary | ICD-10-CM

## 2020-03-07 DIAGNOSIS — R51 Headache with orthostatic component, not elsewhere classified: Secondary | ICD-10-CM | POA: Diagnosis not present

## 2020-03-07 MED ORDER — GADOBENATE DIMEGLUMINE 529 MG/ML IV SOLN
10.0000 mL | Freq: Once | INTRAVENOUS | Status: AC | PRN
Start: 2020-03-07 — End: 2020-03-07
  Administered 2020-03-07: 10 mL via INTRAVENOUS

## 2020-03-13 ENCOUNTER — Telehealth: Payer: Self-pay | Admitting: *Deleted

## 2020-03-13 NOTE — Telephone Encounter (Signed)
Spoke with pt. She saw results of MRI brain on mychart. We discussed result comments from Dr. Leta Baptist. Pt verbalized appreciation and had no questions. F/u appt in August.

## 2020-03-13 NOTE — Telephone Encounter (Signed)
-----   Message from Penni Bombard, MD sent at 03/13/2020 12:56 PM EDT ----- Stable study. No new findings. Please call patient. Continue current plan. -VRP

## 2020-03-19 ENCOUNTER — Other Ambulatory Visit: Payer: Self-pay | Admitting: Internal Medicine

## 2020-03-20 ENCOUNTER — Telehealth: Payer: Self-pay | Admitting: *Deleted

## 2020-03-20 NOTE — Telephone Encounter (Signed)
Rec'd call from pharmacist w/[facility] outpatient. She states we recovered refill for pt Ventolin inhaler. Requesting verbal to change to generate Pro air which is covered by pt insurance. Gave verbal to change.Marland KitchenJohny Chess

## 2020-05-03 ENCOUNTER — Other Ambulatory Visit: Payer: Self-pay | Admitting: Gastroenterology

## 2020-05-10 ENCOUNTER — Ambulatory Visit: Payer: 59 | Admitting: Gastroenterology

## 2020-05-10 ENCOUNTER — Encounter: Payer: Self-pay | Admitting: Gastroenterology

## 2020-05-10 VITALS — BP 112/72 | HR 86 | Ht 65.0 in | Wt 127.5 lb

## 2020-05-10 DIAGNOSIS — K5909 Other constipation: Secondary | ICD-10-CM | POA: Diagnosis not present

## 2020-05-10 DIAGNOSIS — Z8711 Personal history of peptic ulcer disease: Secondary | ICD-10-CM | POA: Diagnosis not present

## 2020-05-10 DIAGNOSIS — R0789 Other chest pain: Secondary | ICD-10-CM | POA: Diagnosis not present

## 2020-05-10 MED ORDER — PANTOPRAZOLE SODIUM 40 MG PO TBEC: 40.0000 mg | DELAYED_RELEASE_TABLET | Freq: Every day | ORAL | 1 refills | Status: DC

## 2020-05-10 NOTE — Patient Instructions (Addendum)
Decrease your Pantoprazole to 20mg  daily. After 2 months, you can stop PPI- Pantoprazole.   Samples of Linzess 67mcg has been given- Take once daily for 12 days. Call office and let us know if this is effective and we will send prescription to pharmacy for you.   Follow-up as needed.   If you are age 36 or older, your body mass index should be between 23-30. Your Body mass index is 21.22 kg/m. If this is out of the aforementioned range listed, please consider follow up with your Primary Care Provider.  If you are age 40 or younger, your body mass index should be between 19-25. Your Body mass index is 21.22 kg/m. If this is out of the aformentioned range listed, please consider follow up with your Primary Care Provider.   Thank you for choosing me and [facility].  Dr. Rush Landmark

## 2020-05-10 NOTE — Progress Notes (Signed)
[facility] VISIT   Primary Care Provider Binnie Rail, MD [place] [address] [phone]  Patient Profile: Jasmine Flores is a 34 y.o. female with a pmh significant for Asthma, Pituitary Adenoma (s/p resection), Migraines, Pregnancy-related GERD, PUD (negative for H. pylori via biopsies), chronic constipation.  The patient presents to the [facility] for an evaluation and management of problem(s) noted below:  Problem List 1. Non-cardiac chest pain   2. Chronic constipation   3. History of peptic ulcer disease     History of Present Illness Please see initial consultation note and prior progress notes for full details of HPI.    Interval History Today, the patient returns for scheduled follow-up.  She has been doing well.  Over the course of the last 6 months she has been able to have complete cessation of her atypical chest discomfort/chest pain.  Her bowel habits remain slightly towards the constipation balance.  The patient was able to stop Carafate approximately 4 months ago.  She remains on 40 mg of Protonix once daily.  She wonders if she can decrease her dose over the course of the coming months or she will need to be on this lifelong.  She describes no significant GERD symptoms.  She did denies any dyspepsia.  No nausea or vomiting.  The patient's transition from her prior job to her current job has been significant in decreasing her life stressors.  GI Review of Systems Positive as above Negative for odynophagia, dysphagia, early satiety, melena, hematochezia  Review of Systems General: Denies fevers/chills/unintentional weight loss Cardiovascular: Denies chest pain/palpitations Pulmonary: Denies shortness of breath Gastroenterological: See HPI Genitourinary: Denies darkened urine Hematological: Denies easy bruising/bleeding Dermatological: Denies jaundice Psychological: Mood is  stable   Medications Current Outpatient Medications  Medication Sig Dispense Refill  . albuterol (VENTOLIN HFA) 108 (90 Base) MCG/ACT inhaler Inhale into the lungs every 6 (six) hours as needed for wheezing or shortness of breath.    . Fremanezumab-vfrm (AJOVY) 225 MG/1.5ML SOAJ Inject 225 mg into the skin every 30 (thirty) days. 1 pen 11  . Multiple Vitamin (MULTIVITAMIN) tablet Take 1 tablet by mouth daily.    . ondansetron (ZOFRAN ODT) 4 MG disintegrating tablet Take 1 tablet (4 mg total) by mouth every 8 (eight) hours as needed (for nausea associated with migraines). 30 tablet 3  . pantoprazole (PROTONIX) 40 MG tablet TAKE 1 TABLET (40 MG TOTAL) BY MOUTH DAILY. 30 tablet 2  . propranolol (INDERAL) 40 MG tablet Take 2 tablets (80 mg total) by mouth 2 (two) times daily. 120 tablet 5  . rizatriptan (MAXALT-MLT) 10 MG disintegrating tablet Take 1 tablet (10 mg total) by mouth as needed for migraine. May repeat in 2 hours if needed 9 tablet 11   No current facility-administered medications for this visit.    Allergies Allergies  Allergen Reactions  . Shellfish Allergy Itching, Swelling, Shortness Of Breath and Anaphylaxis  . Sulfa Antibiotics Itching and Swelling    Patient doesn't ever recall when this happened.    Histories Past Medical History:  Diagnosis Date  . Alpha trait thalassemia   . Anemia   . Asthma   . Headache   . HSV infection   . Migraines   . Positive TB test   . Pregnancy induced hypertension   . Vaginal Pap smear, abnormal    Past Surgical History:  Procedure Laterality Date  . 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  TONSILECTOMY, ADENOIDECTOMY, BILATERAL MYRINGOTOMY AND TUBES    . TONSILLECTOMY     and adenoids  . WISDOM TOOTH EXTRACTION     Social History   Socioeconomic History  . Marital status: Single    Spouse name: Not on file  . Number of children: 2  . Years of education: Not on file  . Highest education level: Master's degree (e.g.,  MA, MS, MEng, MEd, MSW, MBA)  Occupational History  . Not on file  Tobacco Use  . Smoking status: Never Smoker  . Smokeless tobacco: Never Used  Vaping Use  . Vaping Use: Never used  Substance and Sexual Activity  . Alcohol use: No  . Drug use: No  . Sexual activity: Yes    Birth control/protection: None  Other Topics Concern  . Not on file  Social History Narrative   Works for [employer] at home with her fiance and her children   Right handed   Caffeine: has one mini can of soda/day   Social Determinants of Radio broadcast assistant Strain:   . Difficulty of Paying Living Expenses:   Food Insecurity:   . Worried About Charity fundraiser in the Last Year:   . Arboriculturist in the Last Year:   Transportation Needs:   . Film/video editor (Medical):   Marland Kitchen Lack of Transportation (Non-Medical):   Physical Activity:   . Days of Exercise per Week:   . Minutes of Exercise per Session:   Stress:   . Feeling of Stress :   Social Connections:   . Frequency of Communication with Friends and Family:   . Frequency of Social Gatherings with Friends and Family:   . Attends Religious Services:   . Active Member of Clubs or Organizations:   . Attends Archivist Meetings:   Marland Kitchen Marital Status:   Intimate Partner Violence:   . Fear of Current or Ex-Partner:   . Emotionally Abused:   Marland Kitchen Physically Abused:   . Sexually Abused:    Family History  Problem Relation Age of Onset  . Diabetes Mother   . Hypertension Mother   . Diabetes Father   . Hypertension Father   . Stroke Father   . Hypertension Maternal Grandmother   . Diabetes Maternal Grandmother   . Hypertension Maternal Grandfather   . Diabetes Maternal Grandfather   . Colon cancer Neg Hx   . Esophageal cancer Neg Hx   . Inflammatory bowel disease Neg Hx   . Liver disease Neg Hx   . Pancreatic cancer Neg Hx   . Stomach cancer Neg Hx   . Rectal cancer Neg Hx   . Migraines Neg Hx    I have  reviewed her medical, social, and family history in detail and updated the electronic medical record as necessary.    PHYSICAL EXAMINATION  BP 112/72 (BP Location: Left Arm, Patient Position: Sitting, Cuff Size: Normal)   Pulse 86   Ht 5\' 5"  (1.651 m)   Wt 127 lb 8 oz (57.8 kg)   SpO2 100%   BMI 21.22 kg/m  GEN: NAD, appears stated age, doesn't appear chronically ill PSYCH: Cooperative, without pressured speech EYE: Conjunctivae pink, sclerae anicteric ENT: MMM CV: Nontachycardic RESP: No audible wheezing GI: Soft, nondistended, without rebound MSK/EXT: No lower extremity edema SKIN: No jaundice NEURO:  Alert & Oriented x 3, no focal deficits   REVIEW OF DATA  I reviewed the following data at the  time of this encounter:  GI Procedures and Studies  Previously reviewed  Laboratory Studies  Reviewed those in epic  Imaging Studies  No new relevant studies to review   ASSESSMENT  Ms. Jasmine Flores is a 34 y.o. female with a pmh significant for Asthma, Pituitary Adenoma (s/p resection), Migraines, Pregnancy-related GERD, PUD (negative for H. pylori via biopsies), chronic constipation.  The patient is seen today for evaluation and management of:  1. Non-cardiac chest pain   2. Chronic constipation   3. History of peptic ulcer disease    The patient is clinically and hemodynamically stable.  She has done well over the course of the last few months.  We are going to work on decreasing her PPI dose.  We will plan to decrease it to 20 mg daily.  This is in an effort of trying to decrease gastric hypersecretion of acid if we were to stop it too quickly.  We will maintain this for the next 2 months and then come off.  If the patient has any recurrence of symptoms then she will go back up to 40 mg and she can use Carafate on an as-needed basis.  If symptoms were to recur, although she did not have severe/deep ulcers in the stomach she did have significant gastritis, I would recommend a repeat  evaluation.  Since she has done so well we are going to hold off on that.  We previously thought she could have functional heartburn and were considering the potential role of esophageal manometry and pH impedance as well as TCA use but we are going to hold on that as she is doing well.  Her constipation continues to be present even with FiberCon.  I will trial her on Linzess samples and see how she does over the course of the next few weeks.  No red flag symptoms to need colonoscopy at this time.  All patient questions were answered, to the best of my ability, and the patient agrees to the aforementioned plan of action with follow-up as indicated.   PLAN  Transition to Protonix 20 mg daily with 1 refill -Patient may stop medication thereafter If any recurrence of symptoms then increase Protonix back and add Carafate back as needed If any recurrence of symptoms then will consider repeat endoscopy Continue FiberCon daily Linzess 72 mcg samples daily as trial (samples given) and if she does well she will let us know or we will give more samples to find correct dosing for her   No orders of the defined types were placed in this encounter.   New Prescriptions   No medications on file   Modified Medications   No medications on file    Planned Follow Up Return if symptoms worsen or fail to improve.   Total Time in Face-to-Face and in Coordination of Care for patient including independent/personal interpretation/review of prior testing, medical history, examination, medication adjustment, communicating results with the patient directly, and documentation with the EHR is 25 minutes.   Justice Britain, MD [facility] Advanced Endoscopy Office # [phone]

## 2020-06-03 NOTE — Progress Notes (Deleted)
[facility]    Provider:  Dr Jaynee Eagles Requesting Provider: Binnie Rail, MD Primary Care Provider:  Binnie Rail, MD  CC:  Migraines  Interval history June 04, 2020: Patient here for follow-up of migraines, at last appointment she was started on Ajovy.  MRI of the brain was stable in regards to her postoperative pituitary resection otherwise brain normal/unremarkable.   MRi brain 03/12/2020; personally reviewed images and agree with the following: IMPRESSION:   MRI brain (with and without) demonstrating: -Stable postoperative pituitary resection findings; no significant change compared to 2017 MRI. -Remainder of MRI brain is unremarkable.  HPI:  Jasmine Flores is a 34 y.o. female here as requested by Binnie Rail, MD for migraines.  I reviewed Dr. Quay Burow notes: Patient is having 16 migraines a month, worse during her menses), she usually wakes up with renal colic much, we discussed Topamax nortriptyline Aimovig, she is on Fioricet for acute management and Zofran for nausea with her migraines.  Blood pressures are well controlled.  She was continued on.  At prior appointments she also discussed migraines, reporting at least 4/week, she decreased her Excedrin Migraine because of her gastritis, she was started on propranolol in September 2022 also help with blood pressure as well as migraine prophylaxis.  They also discussed Imitrex or Zomig/Maxalt if needed.  She is here alone and reports migraines diagnosed in her 85a, she tried topamax, nortriptyline, propranolol. Stopped taking excedrin or OTC medications, her stomach getting gastritis. Migraines have been worsening since 2019, after her second child, also in the setting of stress. Worse during the menses, the week before that is the worst. She usually wakes up with them every day, skipping meals can trigger, unilateral and usually start on the right and spreads, pounding/pulsating/throbbing, nausea, vomiting,  photo/phonophobia, turnig off the light helps, movement makes it worse and has to be still if bending over it makes it worse and keeps getting worse. Daily headaches and more than 15 migraine days that are moderately severe to severe for > 1 year. She is  A light sleeper but no snoring and no significant fatigue during the day or dry mouth in the morning, she does have young kids and she doesn't have a night where she doesn't sleep throughout the night. She is using condoms and no pregnancy plans. No other focal neurologic deficits, associated symptoms, inciting events or modifiable factors.  Reviewed notes, labs and imaging from outside physicians, which showed:  MRi brain 2017: reviewed images, agree with the following  Postop pituitary resection. No prior studies available. Complex appearance of the sella. 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 The pituitary appears to be up lifted. There is low signal material in the anterior sella which may be graft material from prior trans-sphenoidal resection. Less likely but possible is flow void related to aneurysm or vascular abnormality or gas. For this reason, CTA head is recommended for further evaluation to rule out aneurysm and evaluate the wall of the sphenoid sinus.   Reviewed CTA report only, as below:  IMPRESSION: 1. The unusual low T1 signal area along the anterior sella turcica described on the recent MRI corresponds to a 12 mm mildly lobulated focus of extremely hyperdense material which I favor is either operative glue or cement. This does abut the right ICA siphon, but there is no associated aneurysm or ICA stenosis. 2. Otherwise negative intracranial CTA, and normal CT  appearance of the brain. TSH, CMP normal 01/13/2020  Review of Systems: Patient complains of symptoms per HPI as well as the following symptoms: headache, fatigue Pertinent negatives and  positives per HPI. All others negative.   Social History   Socioeconomic History   Marital status: Single    Spouse name: Not on file   Number of children: 2   Years of education: Not on file   Highest education level: Master's degree (e.g., MA, MS, MEng, MEd, MSW, MBA)  Occupational History   Not on file  Tobacco Use   Smoking status: Never Smoker   Smokeless tobacco: Never Used  Vaping Use   Vaping Use: Never used  Substance and Sexual Activity   Alcohol use: No   Drug use: No   Sexual activity: Yes    Birth control/protection: None  Other Topics Concern   Not on file  Social History Narrative   Works for [employer] at home with her fiance and her children   Right handed   Caffeine: has one mini can of soda/day   Social Determinants of Radio broadcast assistant Strain:    Difficulty of Paying Living Expenses: Not on file  Food Insecurity:    Worried About Charity fundraiser in the Last Year: Not on file   [employer] of Food in the Last Year: Not on file  Transportation Needs:    Lack of Transportation (Medical): Not on file   Lack of Transportation (Non-Medical): Not on file  Physical Activity:    Days of Exercise per Week: Not on file   Minutes of Exercise per Session: Not on file  Stress:    Feeling of Stress : Not on file  Social Connections:    Frequency of Communication with Friends and Family: Not on file   Frequency of Social Gatherings with Friends and Family: Not on file   Attends Religious Services: Not on file   Active Member of Clubs or Organizations: Not on file   Attends Archivist Meetings: Not on file   Marital Status: Not on file  Intimate Partner Violence:    Fear of Current or Ex-Partner: Not on file   Emotionally Abused: Not on file   Physically Abused: Not on file   Sexually Abused: Not on file    Family History  Problem Relation Age of Onset   Diabetes Mother    Hypertension  Mother    Diabetes Father    Hypertension Father    Stroke Father    Hypertension Maternal Grandmother    Diabetes Maternal Grandmother    Hypertension Maternal Grandfather    Diabetes Maternal Grandfather    Colon cancer Neg Hx    Esophageal cancer Neg Hx    Inflammatory bowel disease Neg Hx    Liver disease Neg Hx    Pancreatic cancer Neg Hx    Stomach cancer Neg Hx    Rectal cancer Neg Hx    Migraines Neg Hx     Past Medical History:  Diagnosis Date   Alpha trait thalassemia    Anemia    Asthma    Headache    HSV infection    Migraines    Positive TB test    Pregnancy induced hypertension    Vaginal Pap smear, abnormal     Patient Active Problem List   Diagnosis Date Noted   History of peptic ulcer disease 05/10/2020   Gastritis without bleeding 08/26/2019   Hypertension  06/21/2019   Gastroesophageal reflux disease 04/19/2019   Abnormal MRI 04/19/2019   Abnormal findings on diagnostic imaging of abdomen 04/19/2019   Non-cardiac chest pain 03/26/2019   Chronic constipation 03/26/2019   History of gestational hypertension 07/01/2018   Palpitations 06/22/2017   Thyromegaly 06/22/2017   Joint pain 06/22/2017   Lung nodules, stable no f/u needed 12/28/2016   Pituitary adenoma ([place]) 09/08/2016   Asthma 09/03/2015   Migraine headache 09/03/2015    Past Surgical History:  Procedure Laterality Date   PITUITARY SURGERY  2008   for adenoma   TONSILECTOMY, ADENOIDECTOMY, BILATERAL MYRINGOTOMY AND TUBES     TONSILLECTOMY     and adenoids   WISDOM TOOTH EXTRACTION      Current Outpatient Medications  Medication Sig Dispense Refill   albuterol (VENTOLIN HFA) 108 (90 Base) MCG/ACT inhaler Inhale into the lungs every 6 (six) hours as needed for wheezing or shortness of breath.     Fremanezumab-vfrm (AJOVY) 225 MG/1.5ML SOAJ Inject 225 mg into the skin every 30 (thirty) days. 1 pen 11   Multiple Vitamin (MULTIVITAMIN)  tablet Take 1 tablet by mouth daily.     ondansetron (ZOFRAN ODT) 4 MG disintegrating tablet Take 1 tablet (4 mg total) by mouth every 8 (eight) hours as needed (for nausea associated with migraines). 30 tablet 3   pantoprazole (PROTONIX) 40 MG tablet Take 1 tablet (40 mg total) by mouth daily. (Patient taking differently: Take 40 mg by mouth daily. 05/10/20-Decrease to 20mg  once daily for 2 months- then stop.) 30 tablet 1   propranolol (INDERAL) 40 MG tablet Take 2 tablets (80 mg total) by mouth 2 (two) times daily. 120 tablet 5   rizatriptan (MAXALT-MLT) 10 MG disintegrating tablet Take 1 tablet (10 mg total) by mouth as needed for migraine. May repeat in 2 hours if needed 9 tablet 11   No current facility-administered medications for this visit.    Allergies as of 06/04/2020 - Review Complete 05/10/2020  Allergen Reaction Noted   Shellfish allergy Itching, Swelling, Shortness Of Breath, and Anaphylaxis 11/13/2014   Sulfa antibiotics Itching and Swelling 11/13/2014    Vitals: There were no vitals taken for this visit. Last Weight:  Wt Readings from Last 1 Encounters:  05/10/20 127 lb 8 oz (57.8 kg)   Last Height:   Ht Readings from Last 1 Encounters:  05/10/20 5\' 5"  (1.651 m)     Physical exam: Exam: Gen: NAD, conversant, well nourised, obese, well groomed                     CV: RRR, no MRG. No Carotid Bruits. No peripheral edema, warm, nontender Eyes: Conjunctivae clear without exudates or hemorrhage  Neuro: Detailed Neurologic Exam  Speech:    Speech is normal; fluent and spontaneous with normal comprehension.  Cognition:    The patient is oriented to person, place, and time;     recent and remote memory intact;     language fluent;     normal attention, concentration,     fund of knowledge Cranial Nerves:    The pupils are equal, round, and reactive to light. The fundi are normal and spontaneous venous pulsations are present. Visual fields are full to finger  confrontation. Extraocular movements are intact. Trigeminal sensation is intact and the muscles of mastication are normal. The face is symmetric. The palate elevates in the midline. Hearing intact. Voice is normal. Shoulder shrug is normal. The tongue has normal motion without fasciculations.  Coordination:    Normal finger to nose and heel to shin. Normal rapid alternating movements.   Gait:    Heel-toe and tandem gait are normal.   Motor Observation:    No asymmetry, no atrophy, and no involuntary movements noted. Tone:    Normal muscle tone.    Posture:    Posture is normal. normal erect    Strength:    Strength is V/V in the upper and lower limbs.      Sensation: intact to LT     Reflex Exam:  DTR's:    Deep tendon reflexes in the upper and lower extremities are normal bilaterally.   Toes:    The toes are downgoing bilaterally.   Clonus:    Clonus is absent.    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 They only thing to do is to stop the medication unfortunately. In the timeframe after stopping at her headaches may get much worse.  Do not use these medications more than 2 times in a week.  Migraines: Discussed migraine management including acute management and preventative management. We'll start patient on both. Discussed Topamax side effects especially teratogenicity do not get pregnant and use birth control.  MRI brain due to concerning symptoms of morning headaches, positional headaches,vision changes, pituitary adenoma to look for space occupying mass, chiari or intracranial hypertension (pseudotumor).  Limit Fioricet to < 5 days a month, discussed medication rebound and oversue Try Maxalt for acute management She has tried and failed nortriptyline, topamax, propranolol and doesn't want another pill we can try Ajovy for preventative    No orders of the  defined types were placed in this encounter.  No orders of the defined types were placed in this encounter.   Discussed: To prevent or relieve headaches, try the following: Cool Compress. Lie down and place a cool compress on your head.  Avoid headache triggers. If certain foods or odors seem to have triggered your migraines in the past, avoid them. A headache diary might help you identify triggers.  Include physical activity in your daily routine. Try a daily walk or other moderate aerobic exercise.  Manage stress. Find healthy ways to cope with the stressors, such as delegating tasks on your to-do list.  Practice relaxation techniques. Try deep breathing, yoga, massage and visualization.  Eat regularly. Eating regularly scheduled meals and maintaining a healthy diet might help prevent headaches. Also, drink plenty of fluids.  Follow a regular sleep schedule. Sleep deprivation might contribute to headaches Consider biofeedback. With this mind-body technique, you learn to control certain bodily functions -- such as muscle tension, heart rate and blood pressure -- to prevent headaches or reduce headache pain.    Proceed to emergency room if you experience new or worsening symptoms or symptoms do not resolve, if you have new neurologic symptoms or if headache is severe, or for any concerning symptom.   Provided education and documentation from American headache Society toolbox including articles on: chronic migraine medication overuse headache, chronic migraines, prevention of migraines, behavioral and other nonpharmacologic treatments for headache.  Cc: Binnie Rail, MD,    Sarina Ill, MD  [facility] [address] [place], [place] [address]  Phone [phone] Fax [phone]

## 2020-06-04 ENCOUNTER — Ambulatory Visit: Payer: 59 | Admitting: Neurology

## 2020-06-12 ENCOUNTER — Ambulatory Visit
Admission: EM | Admit: 2020-06-12 | Discharge: 2020-06-12 | Disposition: A | Discharge status: Home / Self Care | Payer: 59 | Attending: Emergency Medicine | Admitting: Emergency Medicine

## 2020-06-12 ENCOUNTER — Emergency Department: Admission: EM | Admit: 2020-06-12 | Discharge: 2020-06-12 | Discharge status: Against Medical Advice | Payer: 59

## 2020-06-12 DIAGNOSIS — R110 Nausea: Secondary | ICD-10-CM

## 2020-06-12 DIAGNOSIS — K5900 Constipation, unspecified: Secondary | ICD-10-CM

## 2020-06-12 MED ORDER — PROMETHAZINE HCL 25 MG PO TABS
25.0000 mg | ORAL_TABLET | Freq: Four times a day (QID) | ORAL | 0 refills | Status: DC | PRN
Start: 2020-06-12 — End: 2020-08-08

## 2020-06-12 NOTE — ED Triage Notes (Addendum)
Pt presents with complaints of nausea. Reports she is 2 months pregnant. States that she has been constipated and having dull abdominal pain with that. Patient states she plans to get an abortion next week.

## 2020-06-12 NOTE — ED Provider Notes (Signed)
[name]    CSN: [id number] Arrival date & time: 06/12/20  1923      History   Chief Complaint Chief Complaint  Patient presents with  . Abdominal Pain    HPI Jasmine Flores is a 34 y.o. female Presenting for nausea, diffuse lower abdominal pain and constipation.  Patient is currently 2 months pregnant: Electing to terminate next week.  Has been constipated for the last week with only 1 bowel movement.  Still passing flatus: Denied vomiting, early satiety or abdominal distention.  No vaginal pain or bleeding.  Denies pelvic pain.  Does endorse nausea: Zofran not helping.  No fever, trauma, change in urination.  Past Medical History:  Diagnosis Date  . Alpha trait thalassemia   . Anemia   . Asthma   . Headache   . HSV infection   . Migraines   . Positive TB test   . Pregnancy induced hypertension   . Vaginal Pap smear, abnormal     Patient Active Problem List   Diagnosis Date Noted  . History of peptic ulcer disease 05/10/2020  . Gastritis without bleeding 08/26/2019  . Hypertension 06/21/2019  . Gastroesophageal reflux disease 04/19/2019  . Abnormal MRI 04/19/2019  . Abnormal findings on diagnostic imaging of abdomen 04/19/2019  . Non-cardiac chest pain 03/26/2019  . Chronic constipation 03/26/2019  . History of gestational hypertension 07/01/2018  . Palpitations 06/22/2017  . 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 Joint pain 06/22/2017  . Lung nodules, stable no f/u needed 12/28/2016  . Pituitary adenoma ([place]) 09/08/2016  . Asthma 09/03/2015  . Migraine headache 09/03/2015    Past Surgical History:  Procedure Laterality Date  . 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 TONSILECTOMY, ADENOIDECTOMY, BILATERAL MYRINGOTOMY AND TUBES    . TONSILLECTOMY     and adenoids  . WISDOM TOOTH EXTRACTION      OB History    Gravida  3   Para  2   Term  1   Preterm  1   AB  0   Living  2     SAB  0   TAB  0   Ectopic  0   Multiple  0    Live Births  2            Home Medications    Prior to Admission medications   Medication Sig Start Date End Date Taking? Authorizing Provider  Fremanezumab-vfrm (AJOVY) 225 MG/1.5ML SOAJ Inject 225 mg into the skin every 30 (thirty) days. 02/20/20  Yes Melvenia Beam, MD  Multiple Vitamin (MULTIVITAMIN) tablet Take 1 tablet by mouth daily.   Yes [provider]  ondansetron (ZOFRAN ODT) 4 MG disintegrating tablet Take 1 tablet (4 mg total) by mouth every 8 (eight) hours as needed (for nausea associated with migraines). 01/13/20  Yes Burns, Claudina Lick, MD  propranolol (INDERAL) 40 MG tablet Take 2 tablets (80 mg total) by mouth 2 (two) times daily. 02/14/20  Yes Burns, Claudina Lick, MD  promethazine (PHENERGAN) 25 MG tablet Take 1 tablet (25 mg total) by mouth every 6 (six) hours as needed for nausea or vomiting. 06/12/20   Hall-Potvin, [place], PA-C  albuterol (VENTOLIN HFA) 108 (90 Base) MCG/ACT inhaler Inhale into the lungs every 6 (six) hours as needed for wheezing or shortness of breath.  06/12/20  [provider]  pantoprazole (PROTONIX) 40 MG tablet Take 1 tablet (40 mg total) by mouth daily. Patient taking differently: Take 40 mg by mouth  daily. 05/10/20-Decrease to 20mg  once daily for 2 months- then stop. 05/10/20 06/12/20  Mansouraty, Telford Nab., MD  rizatriptan (MAXALT-MLT) 10 MG disintegrating tablet Take 1 tablet (10 mg total) by mouth as needed for migraine. May repeat in 2 hours if needed 02/20/20 06/12/20  Melvenia Beam, MD    Family History Family History  Problem Relation Age of Onset  . Diabetes Mother   . Hypertension Mother   . Diabetes Father   . Hypertension Father   . Stroke Father   . Hypertension Maternal Grandmother   . Diabetes Maternal Grandmother   . Hypertension Maternal Grandfather   . Diabetes Maternal Grandfather   . Colon cancer Neg Hx   . Esophageal cancer Neg Hx   . Inflammatory bowel disease Neg Hx   . Liver disease Neg Hx   . Pancreatic  cancer Neg Hx   . Stomach cancer Neg Hx   . Rectal cancer Neg Hx   . Migraines Neg Hx     Social History Social History   Tobacco Use  . Smoking status: Never Smoker  . Smokeless tobacco: Never Used  Vaping Use  . Vaping Use: Never used  Substance Use Topics  . Alcohol use: No  . Drug use: No     Allergies   Shellfish allergy and Sulfa antibiotics   Review of Systems As per HPI   Physical Exam Triage Vital Signs ED Triage Vitals  Enc Vitals Group     BP 06/12/20 2045 (!) 135/96     Pulse Rate 06/12/20 2045 65     Resp 06/12/20 2045 18     Temp 06/12/20 2045 98.6 F (37 C)     Temp src --      SpO2 06/12/20 2045 100 %     Weight --      Height --      Head Circumference --      Peak Flow --      Pain Score 06/12/20 2041 3     Pain Loc --      Pain Edu? --      Excl. in [place]? --    No data found.  Updated Vital Signs BP (!) 135/96   Pulse 65   Temp 98.6 F (37 C)   Resp 18   LMP 12/29/2019 (Within Days)   SpO2 100%   Visual Acuity Right Eye Distance:   Left Eye Distance:   Bilateral Distance:    Right Eye Near:   Left Eye Near:    Bilateral Near:     Physical Exam Constitutional:      General: She is not in acute distress. HENT:     Head: Normocephalic and atraumatic.  Eyes:     General: No scleral icterus.    Pupils: Pupils are equal, round, and reactive to light.  Cardiovascular:     Rate and Rhythm: Normal rate.  Pulmonary:     Effort: Pulmonary effort is normal.  Abdominal:     General: Bowel sounds are normal. There is no distension.     Palpations: There is no hepatomegaly or splenomegaly.     Tenderness: There is abdominal tenderness. There is no right CVA tenderness, left CVA tenderness or guarding. Negative signs include Murphy's sign, Rovsing's sign and McBurney's sign.     Comments: Diffuse, lower abdominal tenderness  Skin:    Coloration: Skin is not jaundiced or pale.  Neurological:     Mental Status: She is alert and  oriented  to person, place, and time.      UC Treatments / Results  Labs (all labs ordered are listed, but only abnormal results are displayed) Labs Reviewed - No data to display  EKG   Radiology No results found.  Procedures Procedures (including critical care time)  Medications Ordered in UC Medications - No data to display  Initial Impression / Assessment and Plan / UC Course  I have reviewed the triage vital signs and the nursing notes.  Pertinent labs & imaging results that were available during my care of the patient were reviewed by me and considered in my medical decision making (see chart for details).     Patient afebrile, nontoxic in office today.  Pelvic exam declined.  Patient requesting Phenergan as this is alleviated nausea in the past.  Patient denies significant abdominal pain, vaginal discharge or bleeding.  Discussed possible ectopic pregnancy: Patient does not want to go to ER at this time.  Discussed risk/benefits of not seeking ER care including, but not limited to death.  Phenergan sent after extensive dialogue about possible risk to pregnancy.  Patient verbalized understanding and acknowledgment of the stress.  Return precautions discussed, pt verbalized understanding and is agreeable to plan. Final Clinical Impressions(s) / UC Diagnoses   Final diagnoses:  Nausea without vomiting  Constipation, unspecified constipation type   Discharge Instructions   None    ED Prescriptions    Medication Sig Dispense Auth. Provider   promethazine (PHENERGAN) 25 MG tablet Take 1 tablet (25 mg total) by mouth every 6 (six) hours as needed for nausea or vomiting. 30 tablet Hall-Potvin, [place], PA-C     PDMP not reviewed this encounter.   Hall-Potvin, [place], [place] 06/13/20 1353

## 2020-07-14 ENCOUNTER — Encounter: Payer: Self-pay | Admitting: Internal Medicine

## 2020-08-03 ENCOUNTER — Ambulatory Visit: Payer: 59

## 2020-08-08 ENCOUNTER — Ambulatory Visit: Payer: 59 | Admitting: Neurology

## 2020-08-08 ENCOUNTER — Other Ambulatory Visit: Payer: Self-pay | Admitting: Neurology

## 2020-08-08 ENCOUNTER — Telehealth: Payer: Self-pay | Admitting: *Deleted

## 2020-08-08 ENCOUNTER — Encounter: Payer: Self-pay | Admitting: Neurology

## 2020-08-08 VITALS — BP 161/80 | HR 80 | Ht 65.0 in | Wt 131.0 lb

## 2020-08-08 DIAGNOSIS — G43709 Chronic migraine without aura, not intractable, without status migrainosus: Secondary | ICD-10-CM | POA: Diagnosis not present

## 2020-08-08 MED ORDER — NARATRIPTAN HCL 2.5 MG PO TABS: 2.5000 mg | ORAL_TABLET | ORAL | 11 refills | Status: DC | PRN

## 2020-08-08 NOTE — Telephone Encounter (Signed)
Qulipta order form faxed to [facility]. Received a receipt of confirmation.

## 2020-08-08 NOTE — Telephone Encounter (Signed)
Per Dr Jaynee Eagles, start Qulipta 60 mg daily. Prescription order form completed, pending MD signature, then will fax to [facility] @ [phone].

## 2020-08-08 NOTE — Progress Notes (Signed)
[facility]    Provider:  Dr Jaynee Eagles Requesting Provider: Binnie Rail, MD Primary Care Provider:  Binnie Rail, MD  CC:  Migraines  Interval history: Ajovy worked decrease a little bit but not working anymore, now back to daily migraines. She was not fan of the injections. The maxalt did not help. We discussed Emgality and she doesn't like the injections. Once the daily headaches came back she started taking excedrin again. We discussed oral medications, the new Qulipta and botox.   Patient complains of symptoms per HPI as well as the following symptoms: daily headaches . Pertinent negatives and positives per HPI. All others negative   HPI:  Jasmine Flores is a 34 y.o. female here as requested by Binnie Rail, MD for migraines.  I reviewed Dr. Quay Burow notes: Patient is having 16 migraines a month, worse during her menses), she usually wakes up with renal colic much, we discussed Topamax nortriptyline Aimovig, she is on Fioricet for acute management and Zofran for nausea with her migraines.  Blood pressures are well controlled.  She was continued on.  At prior appointments she also discussed migraines, reporting at least 4/week, she decreased her Excedrin Migraine because of her gastritis, she was started on propranolol in September 2022 also help with blood pressure as well as migraine prophylaxis.  They also discussed Imitrex or Zomig/Maxalt if needed.  She is here alone and reports migraines diagnosed in her 52a, she tried topamax, nortriptyline, propranolol. Stopped taking excedrin or OTC medications, her stomach getting gastritis. Migraines have been worsening since 2019, after her second child, also in the setting of stress. Worse during the menses, the week before that is the worst. She usually wakes up with them every day, skipping meals can trigger, unilateral and usually start on the right and spreads, pounding/pulsating/throbbing, nausea, vomiting,  photo/phonophobia, turnig off the light helps, movement makes it worse and has to be still if bending over it makes it worse and keeps getting worse. Daily headaches and more than 15 migraine days that are moderately severe to severe for > 1 year. She is  A light sleeper but no snoring and no significant fatigue during the day or dry mouth in the morning, she does have young kids and she doesn't have a night where she doesn't sleep throughout the night. She is using condoms and no pregnancy plans. No other focal neurologic deficits, associated symptoms, inciting events or modifiable factors.  Reviewed notes, labs and imaging from outside physicians, which showed:  MRi brain 2017: reviewed images, agree with the following  Postop pituitary resection. No prior studies available. Complex appearance of the sella. 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 The pituitary appears to be up lifted. There is low signal material in the anterior sella which may be graft material from prior trans-sphenoidal resection. Less likely but possible is flow void related to aneurysm or vascular abnormality or gas. For this reason, CTA head is recommended for further evaluation to rule out aneurysm and evaluate the wall of the sphenoid sinus.   Reviewed CTA report only, as below:  IMPRESSION: 1. The unusual low T1 signal area along the anterior sella turcica described on the recent MRI corresponds to a 12 mm mildly lobulated focus of extremely hyperdense material which I favor is either operative glue or cement. This does abut the right ICA siphon, but there is no associated aneurysm or ICA  stenosis. 2. Otherwise negative intracranial CTA, and normal CT appearance of the brain. TSH, CMP normal 01/13/2020  Review of Systems: Patient complains of symptoms per HPI as well as the following symptoms: headache, fatigue Pertinent negatives and  positives per HPI. All others negative.   Social History   Socioeconomic History  . Marital status: Single    Spouse name: Not on file  . Number of children: 2  . Years of education: Not on file  . Highest education level: Master's degree (e.g., MA, MS, MEng, MEd, MSW, MBA)  Occupational History  . Not on file  Tobacco Use  . Smoking status: Never Smoker  . Smokeless tobacco: Never Used  Vaping Use  . Vaping Use: Never used  Substance and Sexual Activity  . Alcohol use: No  . Drug use: No  . Sexual activity: Yes    Birth control/protection: None  Other Topics Concern  . Not on file  Social History Narrative   Works for [employer] at home with her fiance and her children   Right handed   Caffeine: has one can of soda/day   Social Determinants of Radio broadcast assistant Strain:   . Difficulty of Paying Living Expenses: Not on file  Food Insecurity:   . Worried About Charity fundraiser in the Last Year: Not on file  . Ran Out of Food in the Last Year: Not on file  Transportation Needs:   . Lack of Transportation (Medical): Not on file  . Lack of Transportation (Non-Medical): Not on file  Physical Activity:   . Days of Exercise per Week: Not on file  . Minutes of Exercise per Session: Not on file  Stress:   . Feeling of Stress : Not on file  Social Connections:   . Frequency of Communication with Friends and Family: Not on file  . Frequency of Social Gatherings with Friends and Family: Not on file  . Attends Religious Services: Not on file  . Active Member of Clubs or Organizations: Not on file  . Attends Archivist Meetings: Not on file  . Marital Status: Not on file  Intimate Partner Violence:   . Fear of Current or Ex-Partner: Not on file  . Emotionally Abused: Not on file  . Physically Abused: Not on file  . Sexually Abused: Not on file    Family History  Problem Relation Age of Onset  . Diabetes Mother   . Hypertension Mother    . Diabetes Father   . Hypertension Father   . Stroke Father   . Hypertension Maternal Grandmother   . Diabetes Maternal Grandmother   . Hypertension Maternal Grandfather   . Diabetes Maternal Grandfather   . Colon cancer Neg Hx   . Esophageal cancer Neg Hx   . Inflammatory bowel disease Neg Hx   . Liver disease Neg Hx   . Pancreatic cancer Neg Hx   . Stomach cancer Neg Hx   . Rectal cancer Neg Hx   . Migraines Neg Hx     Past Medical History:  Diagnosis Date  . Alpha trait thalassemia   . Anemia   . Asthma   . Headache   . HSV infection   . Migraines   . Positive TB test   . Pregnancy induced hypertension   . Vaginal Pap smear, abnormal     Patient Active Problem List   Diagnosis Date Noted  . History of peptic ulcer disease 05/10/2020  .  Gastritis without bleeding 08/26/2019  . Hypertension 06/21/2019  . Gastroesophageal reflux disease 04/19/2019  . Abnormal MRI 04/19/2019  . Abnormal findings on diagnostic imaging of abdomen 04/19/2019  . Non-cardiac chest pain 03/26/2019  . Chronic constipation 03/26/2019  . History of gestational hypertension 07/01/2018  . Palpitations 06/22/2017  . 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 Joint pain 06/22/2017  . Lung nodules, stable no f/u needed 12/28/2016  . Pituitary adenoma ([place]) 09/08/2016  . Asthma 09/03/2015  . Migraine headache 09/03/2015    Past Surgical History:  Procedure Laterality Date  . 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 TONSILECTOMY, ADENOIDECTOMY, BILATERAL MYRINGOTOMY AND TUBES    . TONSILLECTOMY     and adenoids  . WISDOM TOOTH EXTRACTION      Current Outpatient Medications  Medication Sig Dispense Refill  . Multiple Vitamin (MULTIVITAMIN) tablet Take 1 tablet by mouth daily.    . ondansetron (ZOFRAN ODT) 4 MG disintegrating tablet Take 1 tablet (4 mg total) by mouth every 8 (eight) hours as needed (for nausea associated with migraines). 30 tablet 3  . PRESCRIPTION MEDICATION Take 60 mg by mouth  daily. QULIPTA    . propranolol (INDERAL) 40 MG tablet Take 2 tablets (80 mg total) by mouth 2 (two) times daily. 120 tablet 5  . naratriptan (AMERGE) 2.5 MG tablet Take 1 tablet (2.5 mg total) by mouth as needed for migraine. Take one (1) tablet at onset of headache; if returns or does not resolve, may repeat after 2 hours; do not exceed five (5) mg in 24 hours. 9 tablet 11   No current facility-administered medications for this visit.    Allergies as of 08/08/2020 - Review Complete 08/08/2020  Allergen Reaction Noted  . Shellfish allergy Itching, Swelling, Shortness Of Breath, and Anaphylaxis 11/13/2014  . Sulfa antibiotics Itching and Swelling 11/13/2014    Vitals: BP (!) 161/80 (BP Location: Left Arm, Patient Position: Sitting)   Pulse 80   Ht 5\' 5"  (1.651 m)   Wt 131 lb (59.4 kg)   LMP 07/29/2020   Breastfeeding Unknown   BMI 21.80 kg/m  Last Weight:  Wt Readings from Last 1 Encounters:  08/08/20 131 lb (59.4 kg)   Last Height:   Ht Readings from Last 1 Encounters:  08/08/20 5\' 5"  (1.651 m)   Exam: NAD, pleasant                  Speech:    Speech is normal; fluent and spontaneous with normal comprehension.  Cognition:    The patient is oriented to person, place, and time;     recent and remote memory intact;     language fluent;    Cranial Nerves:    The pupils are equal, round, and reactive to light.Trigeminal sensation is intact and the muscles of mastication are normal. The face is symmetric. The palate elevates in the midline. Hearing intact. Voice is normal. Shoulder shrug is normal. The tongue has normal motion without fasciculations.   Coordination:  No dysmetria  Motor Observation:    No asymmetry, no atrophy, and no involuntary movements noted. Tone:    Normal muscle tone.     Strength:    Strength is V/V in the upper and lower limbs.      Sensation: intact to LT       Assessment/Plan:  Chronic daily headaches due to medication overuse and  migraines Medication overuse is likely causing her chronic daily headaches. They only thing to do is to  stop the medication unfortunately. In the timeframe after stopping at her headaches may get much worse.  Do not use these medications more than 2 times in a week.  Ajovy worked and decrease a little(to 10 migraine days a month) but not working anymore, now back to daily migraines.Discussed Emgality and She was not fan of the injections. The maxalt did not help. We discussed Emgality and she doesn't like the injections. Once the daily headaches came back she started taking excedrin again, she is aware of medication rebound. Maxalt did not work she states. We discussed oral medications, the new Qulipta and botox.  MRI brain stable  Limit Fioricet and excedrin to < 5 days a month, discussed medication rebound and oversue Try Amerge for acute management She has tried and failed nortriptyline, topamax, propranolol, Ajovy, imitrex, maxalt, and doesn't want another injection we can try Qulipta for preventative and naratriptan acutely Printed drug information from UpToDate, also checked in [employer] and no significant interactions with Qulipta, zofran, amerge or propranolol Gave her 24 tablets samples and will send in prescription Recommended botox   No orders of the defined types were placed in this encounter.  Meds ordered this encounter  Medications  . naratriptan (AMERGE) 2.5 MG tablet    Sig: Take 1 tablet (2.5 mg total) by mouth as needed for migraine. Take one (1) tablet at onset of headache; if returns or does not resolve, may repeat after 2 hours; do not exceed five (5) mg in 24 hours.    Dispense:  9 tablet    Refill:  11    Discussed: To prevent or relieve headaches, try the following: Cool Compress. Lie down and place a cool compress on your head.  Avoid headache triggers. If certain foods or odors seem to have triggered your migraines in the past, avoid them. A headache diary might  help you identify triggers.  Include physical activity in your daily routine. Try a daily walk or other moderate aerobic exercise.  Manage stress. Find healthy ways to cope with the stressors, such as delegating tasks on your to-do list.  Practice relaxation techniques. Try deep breathing, yoga, massage and visualization.  Eat regularly. Eating regularly scheduled meals and maintaining a healthy diet might help prevent headaches. Also, drink plenty of fluids.  Follow a regular sleep schedule. Sleep deprivation might contribute to headaches Consider biofeedback. With this mind-body technique, you learn to control certain bodily functions -- such as muscle tension, heart rate and blood pressure -- to prevent headaches or reduce headache pain.    Proceed to emergency room if you experience new or worsening symptoms or symptoms do not resolve, if you have new neurologic symptoms or if headache is severe, or for any concerning symptom.   Provided education and documentation from American headache Society toolbox including articles on: chronic migraine medication overuse headache, chronic migraines, prevention of migraines, behavioral and other nonpharmacologic treatments for headache.  Cc: Binnie Rail, MD,    Sarina Ill, MD  [facility] [address] [place], [place] [address]  Phone [phone] Fax [phone]  I spent over 30  minutes of face-to-face and non-face-to-face time with patient on the  1. Chronic migraine without aura without status migrainosus, not intractable    diagnosis.  This included previsit chart review, lab review, study review, order entry, electronic health record documentation, patient education on the different diagnostic and therapeutic options, counseling and coordination of care, risks and benefits of management, compliance, or risk factor reduction

## 2020-08-08 NOTE — Patient Instructions (Addendum)
Qulipta once daily for prevention At onset of headache: Naratriptan. Please take one tablet at the onset of your headache. If it does not improve the symptoms please take one additional tablet. Do not take more then 2 tablets in 24hrs. Do not take use more then 2 to 3 times in a week.  Naratriptan tablets What is this medicine? NARATRIPTAN (NAR a trip tan) is used to treat migraines with or without aura. An aura is a strange feeling or visual disturbance that warns you of an attack. It is not used to prevent migraines. This medicine may be used for other purposes; ask your health care provider or pharmacist if you have questions. COMMON BRAND NAME(S): Amerge What should I tell my health care provider before I take this medicine? They need to know if you have any of these conditions:  cigarette smoker  circulation problems in fingers and toes  diabetes  heart disease  high blood pressure  high cholesterol  history of irregular heartbeat  history of stroke  kidney disease  liver disease  stomach or intestine problems  an unusual or allergic reaction to naratriptan, other medicines, foods, dyes, or preservatives  pregnant or trying to get pregnant  breast-feeding How should I use this medicine? Take this medicine by mouth with a glass of water. Follow the directions on the prescription label. Do not take it more often than directed. Talk to your pediatrician regarding the use of this medicine in children. Special care may be needed. Overdosage: If you think you have taken too much of this medicine contact a poison control center or emergency room at once. NOTE: This medicine is only for you. Do not share this medicine with others. What if I miss a dose? This does not apply. This medicine is not for regular use. What may interact with this medicine? Do not take this medicine with any of the following medicines:  ergot alkaloids like dihydroergotamine, ergonovine,  ergotamine, methylergonovine  certain medicines for migraine headache like almotriptan, eletriptan, frovatriptan, naratriptan, rizatriptan, sumatriptan, zolmitriptan This medicine may also interact with the following medications:  certain medicines for depression, anxiety, or psychotic disorders This list may not describe all possible interactions. Give your health care provider a list of all the medicines, herbs, non-prescription drugs, or dietary supplements you use. Also tell them if you smoke, drink alcohol, or use illegal drugs. Some items may interact with your medicine. What should I watch for while using this medicine? Visit your healthcare professional for regular checks on your progress. Tell your healthcare professional if your symptoms do not start to get better or if they get worse. You may get drowsy or dizzy. Do not drive, use machinery, or do anything that needs mental alertness until you know how this medicine affects you. Do not stand up or sit up quickly, especially if you are an older patient. This reduces the risk of dizzy or fainting spells. Alcohol may interfere with the effect of this medicine. Tell your healthcare professional right away if you have any change in your eyesight. If you take migraine medicines for 10 or more days a month, your migraines may get worse. Keep a diary of headache days and medicine use. Contact your healthcare professional if your migraine attacks occur more frequently. What side effects may I notice from receiving this medicine? Side effects that you should report to your doctor or health care professional as soon as possible:  allergic reactions like skin rash, itching or hives, swelling of the  face, lips, or tongue  changes in vision  chest pain or chest tightness  signs and symptoms of a dangerous change in heartbeat or heart rhythm like chest pain; dizziness; fast, irregular heartbeat; palpitations; feeling faint or lightheaded; falls;  breathing problems  signs and symptoms of a stroke like changes in vision; confusion; trouble speaking or understanding; severe headaches; sudden numbness or weakness of the face, arm or leg; trouble walking; dizziness; loss of balance or coordination  signs and symptoms of serotonin syndrome like irritable; confusion; diarrhea; fast or irregular heartbeat; muscle twitching; stiff muscles; trouble walking; sweating; high fever; seizures; chills; vomiting Side effects that usually do not require medical attention (report to your doctor or health care professional if they continue or are bothersome):  diarrhea  dizziness  drowsiness  headache  nausea, vomiting  pain, tingling, numbness in the hands or feet  stomach pain This list may not describe all possible side effects. Call your doctor for medical advice about side effects. You may report side effects to FDA at 1-800-FDA-1088. Where should I keep my medicine? Keep out of the reach of children. Store at room temperature between 20 and 25 degrees C (68 and 77 degrees F). Throw away any unused medicine after the expiration date. NOTE: This sheet is a summary. It may not cover all possible information. If you have questions about this medicine, talk to your doctor, pharmacist, or health care provider.  2020 Elsevier/Gold Standard (2018-04-06 14:55:22)

## 2020-08-27 ENCOUNTER — Other Ambulatory Visit: Payer: Self-pay | Admitting: Neurology

## 2020-08-27 DIAGNOSIS — G43709 Chronic migraine without aura, not intractable, without status migrainosus: Secondary | ICD-10-CM

## 2020-08-27 MED ORDER — EMGALITY 120 MG/ML ~~LOC~~ SOAJ: 120.0000 mg | SUBCUTANEOUS | 11 refills | Status: DC

## 2020-08-27 NOTE — Telephone Encounter (Signed)
I spoke with the [facility] patient rep, Derk, and let him know the pt did not tolerate Qulipta and is switching therapy. He verbalized appreciation for the update. He stated the pt had actually been approved for the bridge program. He will make note of the discontinuation of Qulipta.

## 2020-08-28 ENCOUNTER — Encounter: Payer: Self-pay | Admitting: *Deleted

## 2020-08-29 ENCOUNTER — Telehealth: Payer: Self-pay | Admitting: *Deleted

## 2020-08-29 NOTE — Telephone Encounter (Signed)
Completed Emgality PA on Cover My Meds. Key: [id number]. Awaiting determination from [facility].

## 2020-08-29 NOTE — Telephone Encounter (Signed)
Per Cover My Meds, Request Reference Number: [id number]. EMGALITY INJ 120MG /ML is approved through 02/26/2021. Your patient may now fill this prescription and it will be covered.

## 2020-09-06 ENCOUNTER — Other Ambulatory Visit: Payer: Self-pay | Admitting: Internal Medicine

## 2020-10-25 ENCOUNTER — Ambulatory Visit: Payer: 59 | Admitting: Internal Medicine

## 2020-11-16 ENCOUNTER — Other Ambulatory Visit: Payer: Self-pay | Admitting: Neurology

## 2020-11-16 MED ORDER — METHYLPREDNISOLONE 4 MG PO TBPK: ORAL_TABLET | ORAL | 1 refills | Status: DC

## 2020-12-11 ENCOUNTER — Telehealth (INDEPENDENT_AMBULATORY_CARE_PROVIDER_SITE_OTHER): Payer: 59 | Admitting: Adult Health

## 2020-12-11 DIAGNOSIS — G43719 Chronic migraine without aura, intractable, without status migrainosus: Secondary | ICD-10-CM

## 2020-12-11 NOTE — Progress Notes (Addendum)
PATIENT: Jasmine Flores DOB: [birth year]  REASON FOR VISIT: follow up HISTORY FROM: patient  Virtual Visit via Video Note  I connected with Jasmine Flores on 12/11/20 at  3:00 PM EST by a video enabled telemedicine application located remotely at [facility] and verified that I am speaking with the correct person using two identifiers who was located at their own home.   I discussed the limitations of evaluation and management by telemedicine and the availability of in person appointments. The patient expressed understanding and agreed to proceed.   PATIENT: Jasmine Flores DOB: [birth year]  REASON FOR VISIT: follow up HISTORY FROM: patient  HISTORY OF PRESENT ILLNESS: Today 12/11/20:  Ms. Jasmine Flores is a 35 year old female with a history of migraine headaches.  She returns today for follow-up.  She states that she has been having daily headaches.  She states that at the end of January she had Covid.  She developed daily headaches.  She was prescribed a steroid Dosepak by Dr. Lavell Anchors at the beginning of February.  She states that her headaches resolved while she was taking steroid.  They have slowly came back.  She reports her headache severity varies day-to-day.  She states that she has had some headache free days but not many.  She uses the Amerge but was only given 4 tablets by the pharmacy.  She states that she is only taking Excedrin 3 times a week.  She returns today for an evaluation.  HISTORY Interval history: Ajovy worked decrease a little bit but not working anymore, now back to daily migraines. She was not fan of the injections. The maxalt did not help. We discussed Emgality and she doesn't like the injections. Once the daily headaches came back she started taking excedrin again. We discussed oral medications, the new Qulipta and botox.   Patient complains of symptoms per HPI as well as the following symptoms: daily headaches . Pertinent  negatives and positives per HPI. All others negative   HPI:  Jasmine Flores is a 35 y.o. female here as requested by Binnie Rail, MD for migraines.  I reviewed Dr. Quay Burow notes: Patient is having 16 migraines a month, worse during her menses), she usually wakes up with renal colic much, we discussed Topamax nortriptyline Aimovig, she is on Fioricet for acute management and Zofran for nausea with her migraines.  Blood pressures are well controlled.  She was continued on.  At prior appointments she also discussed migraines, reporting at least 4/week, she decreased her Excedrin Migraine because of her gastritis, she was started on propranolol in September 2022 also help with blood pressure as well as migraine prophylaxis.  They also discussed Imitrex or Zomig/Maxalt if needed.  She is here alone and reports migraines diagnosed in her 20a, she tried topamax, nortriptyline, propranolol. Stopped taking excedrin or OTC medications, her stomach getting gastritis. Migraines have been worsening since 2019, after her second child, also in the setting of stress. Worse during the menses, the week before that is the worst. She usually wakes up with them every day, skipping meals can trigger, unilateral and usually start on the right and spreads, pounding/pulsating/throbbing, nausea, vomiting, photo/phonophobia, turnig off the light helps, movement makes it worse and has to be still if bending over it makes it worse and keeps getting worse. Daily headaches and more than 15 migraine days that are moderately severe to severe for > 1 year. She is  A light sleeper but no snoring and  no significant fatigue during the day or dry mouth in the morning, she does have young kids and she doesn't have a night where she doesn't sleep throughout the night. She is using condoms and no pregnancy plans. No other focal neurologic deficits, associated symptoms, inciting events or modifiable factors.  Reviewed notes, labs and  imaging from outside physicians, which showed:  MRi brain 2017: reviewed images, agree with the following  Postop pituitary resection. No prior studies available. Complex appearance of the sella. 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 The pituitary appears to be up lifted. There is low signal material in the anterior sella which may be graft material from prior trans-sphenoidal resection. Less likely but possible is flow void related to aneurysm or vascular abnormality or gas. For this reason, CTA head is recommended for further evaluation to rule out aneurysm and evaluate the wall of the sphenoid sinus.   Reviewed CTA report only, as below:  IMPRESSION: 1. The unusual low T1 signal area along the anterior sella turcica described on the recent MRI corresponds to a 12 mm mildly lobulated focus of extremely hyperdense material which I favor is either operative glue or cement. This does abut the right ICA siphon, but there is no associated aneurysm or ICA stenosis. 2. Otherwise negative intracranial CTA, and normal CT appearance of the brain. TSH, CMP normal 01/13/2020   REVIEW OF SYSTEMS: Out of a complete 14 system review of symptoms, the patient complains only of the following symptoms, and all other reviewed systems are negative.  ALLERGIES: Allergies  Allergen Reactions  . Shellfish Allergy Itching, Swelling, Shortness Of Breath and Anaphylaxis  . Sulfa Antibiotics Itching and Swelling    Patient doesn't ever recall when this happened.    HOME MEDICATIONS: Outpatient Medications Prior to Visit  Medication Sig Dispense Refill  . Galcanezumab-gnlm (EMGALITY) 120 MG/ML SOAJ Inject 120 mg into the skin every 30 (thirty) days. 1.12 mL 11  . Multiple Vitamin (MULTIVITAMIN) tablet Take 1 tablet by mouth daily.    . naratriptan (AMERGE) 2.5 MG tablet Take 1 tablet (2.5 mg total) by mouth as needed for  migraine. Take one (1) tablet at onset of headache; if returns or does not resolve, may repeat after 2 hours; do not exceed five (5) mg in 24 hours. 9 tablet 11  . ondansetron (ZOFRAN ODT) 4 MG disintegrating tablet Take 1 tablet (4 mg total) by mouth every 8 (eight) hours as needed (for nausea associated with migraines). 30 tablet 3  . PRESCRIPTION MEDICATION Take 60 mg by mouth daily. QULIPTA    . propranolol (INDERAL) 40 MG tablet TAKE 2 TABLETS (80 MG TOTAL) BY MOUTH 2 (TWO) TIMES DAILY. 120 tablet 5  . methylPREDNISolone (MEDROL DOSEPAK) 4 MG TBPK tablet Take pills all together daily for 6 days (6-5-4-3-2-1) 21 tablet 1   No facility-administered medications prior to visit.    PAST MEDICAL HISTORY: Past Medical History:  Diagnosis Date  . Alpha trait thalassemia   . Anemia   . Asthma   . Headache   . HSV infection   . Migraines   . Positive TB test   . Pregnancy induced hypertension   . Vaginal Pap smear, abnormal     PAST SURGICAL HISTORY: Past Surgical History:  Procedure Laterality Date  . 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 TONSILECTOMY, ADENOIDECTOMY, BILATERAL MYRINGOTOMY AND TUBES    . TONSILLECTOMY  and adenoids  . WISDOM TOOTH EXTRACTION      FAMILY HISTORY: Family History  Problem Relation Age of Onset  . Diabetes Mother   . Hypertension Mother   . Diabetes Father   . Hypertension Father   . Stroke Father   . Hypertension Maternal Grandmother   . Diabetes Maternal Grandmother   . Hypertension Maternal Grandfather   . Diabetes Maternal Grandfather   . Colon cancer Neg Hx   . Esophageal cancer Neg Hx   . Inflammatory bowel disease Neg Hx   . Liver disease Neg Hx   . Pancreatic cancer Neg Hx   . Stomach cancer Neg Hx   . Rectal cancer Neg Hx   . Migraines Neg Hx     SOCIAL HISTORY: Social History   Socioeconomic History  . Marital status: Single    Spouse name: Not on file  . Number of children: 2  . Years of education: Not on file   . Highest education level: Master's degree (e.g., MA, MS, MEng, MEd, MSW, MBA)  Occupational History  . Not on file  Tobacco Use  . Smoking status: Never Smoker  . Smokeless tobacco: Never Used  Vaping Use  . Vaping Use: Never used  Substance and Sexual Activity  . Alcohol use: No  . Drug use: No  . Sexual activity: Yes    Birth control/protection: None  Other Topics Concern  . Not on file  Social History Narrative   Works for [employer] at home with her fiance and her children   Right handed   Caffeine: has one can of soda/day   Social Determinants of Radio broadcast assistant Strain: Not on file  Food Insecurity: Not on file  Transportation Needs: Not on file  Physical Activity: Not on file  Stress: Not on file  Social Connections: Not on file  Intimate Partner Violence: Not on file      PHYSICAL EXAM Generalized: Well developed, in no acute distress   Neurological examination  Mentation: Alert oriented to time, place, history taking. Follows all commands speech and language fluent Cranial nerve II-XII:Extraocular movements were full. Facial symmetry noted. uvula tongue midline. Head turning and shoulder shrug  were normal and symmetric. Motor: Good strength throughout subjectively per patient Sensory: Sensory testing is intact to soft touch on all 4 extremities subjectively per patient Coordination: Cerebellar testing reveals good finger-nose-finger  Gait and station: Patient is able to stand from a seated position. gait is normal.  Reflexes: UTA  DIAGNOSTIC DATA (LABS, IMAGING, TESTING) - I reviewed patient records, labs, notes, testing and imaging myself where available.  Lab Results  Component Value Date   WBC 4.1 01/13/2020   HGB 11.0 (L) 01/13/2020   HCT 33.8 (L) 01/13/2020   MCV 75.9 (L) 01/13/2020   PLT 299.0 01/13/2020      Component Value Date/Time   NA 138 01/13/2020 0917   K 4.5 01/13/2020 0917   CL 102 01/13/2020 0917   CO2  31 01/13/2020 0917   GLUCOSE 88 01/13/2020 0917   BUN 22 01/13/2020 0917   CREATININE 0.62 01/13/2020 0917   CALCIUM 8.9 01/13/2020 0917   PROT 7.0 01/13/2020 0917   ALBUMIN 4.2 01/13/2020 0917   AST 15 01/13/2020 0917   ALT 14 01/13/2020 0917   ALKPHOS 65 01/13/2020 0917   BILITOT 0.4 01/13/2020 0917   GFRNONAA >60 06/07/2019 1530   GFRAA >60 06/07/2019 1530   Lab Results  Component Value Date  CHOL 154 01/13/2020   HDL 58.60 01/13/2020   LDLCALC 79 01/13/2020   TRIG 80.0 01/13/2020   CHOLHDL 3 01/13/2020   No results found for: HGBA1C No results found for: VITAMINB12 Lab Results  Component Value Date   TSH 0.65 01/13/2020      ASSESSMENT AND PLAN 35 y.o. year old female  has a past medical history of Alpha trait thalassemia, Anemia, Asthma, Headache, HSV infection, Migraines, Positive TB test, Pregnancy induced hypertension, and Vaginal Pap smear, abnormal. here with:  1.  Migraine headache  Advised patient that a post Covid headache can last several weeks to months.  She is only taking 3 injections of the Emgality.  Advised that she could continue to try the [facility] for the next 1 to 2 months to see if her headache frequency starts to reduce or we could change the Botox treatment.  The patient is amenable to trying Emgality for the next 1 to 2 months.  I have encouraged her to keep a headache journal.  If this is not beneficial then we will switch to Botox.  She will follow-up in 1 to 2 months or sooner if needed  She has tried nortriptyline, topamax, propranolol, Ajovy, imitrex, maxalt,  Qulipta. emgality  naratriptan    I spent 30 minutes of face-to-face and non-face-to-face time with patient.  This included previsit chart review, lab review, study review, order entry, electronic health record documentation, patient education.    Ward Givens, MSN, NP-C 12/11/2020, 3:36 PM [facility] [address] [place], [place] [address] [phone]   agree with assessment and plan as stated.     Sarina Ill, MD [facility]

## 2021-01-01 ENCOUNTER — Telehealth: Payer: Self-pay | Admitting: Adult Health

## 2021-01-01 NOTE — Telephone Encounter (Signed)
I called pt and LMVM that received call from [place] about safety report.  I do not have any report about this that I can see.  Please return call when you can.

## 2021-01-01 NOTE — Telephone Encounter (Signed)
Pt called back and she has not relayed any issue with anything referring to safety report.  She had has had no adverse effect from any medication.  Only thing she stated was that her only to get 4 tabs of amerge vs the 9 that was ordered.  She has not had to take since last seen [place], doing better.  I will hold off on this unless they call again.

## 2021-01-01 NOTE — Telephone Encounter (Signed)
[facility] ([facility]) called, following up on a safety report.   Contact info: [phone] option 1 Reference no: [id number] Reference no: [id number]

## 2021-01-05 ENCOUNTER — Other Ambulatory Visit (HOSPITAL_COMMUNITY): Payer: Self-pay

## 2021-01-10 MED FILL — Naratriptan HCl Tab 2.5 MG (Base Equiv): ORAL | 30 days supply | Qty: 4 | Fill #0 | Status: AC

## 2021-01-11 ENCOUNTER — Other Ambulatory Visit (HOSPITAL_COMMUNITY): Payer: Self-pay

## 2021-01-16 ENCOUNTER — Encounter: Payer: Self-pay | Admitting: Internal Medicine

## 2021-01-16 NOTE — Progress Notes (Signed)
Subjective:    Patient ID: Jasmine Flores, female    DOB: November 22, 1985, 35 y.o.   MRN: [id number]   This visit occurred during the SARS-CoV-2 public health emergency.  Safety protocols were in place, including screening questions prior to the visit, additional usage of staff PPE, and extensive cleaning of exam room while observing appropriate contact time as indicated for disinfecting solutions.    HPI She is here for a physical exam.   She denies any major changes in her health since she was here last.  She has no concerns.  Medications and allergies reviewed with patient and updated if appropriate.  Patient Active Problem List   Diagnosis Date Noted  . History of peptic ulcer disease 05/10/2020  . Gastritis without bleeding 08/26/2019  . Hypertension 06/21/2019  . Gastroesophageal reflux disease 04/19/2019  . Abnormal findings on diagnostic imaging of abdomen 04/19/2019  . Chronic constipation 03/26/2019  . History of gestational hypertension 07/01/2018  . Palpitations 06/22/2017  . 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 Lung nodules, stable no f/u needed 12/28/2016  . Pituitary adenoma ([place]) 09/08/2016  . Asthma 09/03/2015  . Migraine headache 09/03/2015    Current Outpatient Medications on File Prior to Visit  Medication Sig Dispense Refill  . Galcanezumab-gnlm 120 MG/ML SOAJ INJECT 120 MG INTO THE SKIN EVERY 30 (THIRTY) DAYS. 1 mL 11  . Multiple Vitamin (MULTIVITAMIN) tablet Take 1 tablet by mouth daily.    . naratriptan (AMERGE) 2.5 MG tablet TAKE 1 TABLET BY MOUTH AT ONSET OF HEADACHE/MIGRAINE. IF RETURNS OR DOES NOT RESOLVE MAY REPEAT AFTER 2 HOURS DO NOT EXCEED 5MG  IN 24 HOURS 9 tablet 11  . ondansetron (ZOFRAN ODT) 4 MG disintegrating tablet Take 1 tablet (4 mg total) by mouth every 8 (eight) hours as needed (for nausea associated with migraines). 30 tablet 3  . pantoprazole (PROTONIX) 40 MG tablet TAKE 1 TABLET (40 MG TOTAL) BY MOUTH DAILY. 30 tablet 2  . PRESCRIPTION  MEDICATION Take 60 mg by mouth daily. QULIPTA     No current facility-administered medications on file prior to visit.    Past Medical History:  Diagnosis Date  . Alpha trait thalassemia   . Anemia   . Asthma   . Headache   . HSV infection   . Migraines   . Positive TB test   . Pregnancy induced hypertension   . Vaginal Pap smear, abnormal     Past Surgical History:  Procedure Laterality Date  . 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 TONSILECTOMY, ADENOIDECTOMY, BILATERAL MYRINGOTOMY AND TUBES    . TONSILLECTOMY     and adenoids  . WISDOM TOOTH EXTRACTION      Social History   Socioeconomic History  . Marital status: Single    Spouse name: Not on file  . Number of children: 2  . Years of education: Not on file  . Highest education level: Master's degree (e.g., MA, MS, MEng, MEd, MSW, MBA)  Occupational History  . Not on file  Tobacco Use  . Smoking status: Never Smoker  . Smokeless tobacco: Never Used  Vaping Use  . Vaping Use: Never used  Substance and Sexual Activity  . Alcohol use: No  . Drug use: No  . Sexual activity: Yes    Birth control/protection: None  Other Topics Concern  . Not on file  Social History Narrative   Works for [employer] at home with her fiance and her children  Right handed   Caffeine: has one can of soda/day   Social Determinants of Health   Financial Resource Strain: Not on file  Food Insecurity: Not on file  Transportation Needs: Not on file  Physical Activity: Not on file  Stress: Not on file  Social Connections: Not on file    Family History  Problem Relation Age of Onset  . Diabetes Mother   . Hypertension Mother   . Diabetes Father   . Hypertension Father   . Stroke Father   . Hypertension Maternal Grandmother   . Diabetes Maternal Grandmother   . Hypertension Maternal Grandfather   . Diabetes Maternal Grandfather   . Colon cancer Neg Hx   . Esophageal cancer Neg Hx   . Inflammatory bowel  disease Neg Hx   . Liver disease Neg Hx   . Pancreatic cancer Neg Hx   . Stomach cancer Neg Hx   . Rectal cancer Neg Hx   . Migraines Neg Hx     Review of Systems  Constitutional: Negative for chills and fever.  Eyes: Negative for visual disturbance.  Respiratory: Negative for cough, shortness of breath and wheezing.   Cardiovascular: Positive for palpitations (occ - with anxiety). Negative for chest pain and leg swelling.  Gastrointestinal: Positive for nausea (with migraines or migraine med). Negative for abdominal pain, blood in stool, constipation and diarrhea.       Occ gerd   Genitourinary: Negative for dysuria.  Musculoskeletal: Negative.  Negative for arthralgias and back pain.  Skin: Negative for color change and rash.  Neurological: Positive for dizziness (occ w/ migraine) and headaches. Negative for light-headedness.  Psychiatric/Behavioral: Positive for sleep disturbance. Negative for dysphoric mood. The patient is nervous/anxious.        Objective:   Vitals:   01/17/21 0831  BP: 116/72  Pulse: 77  Temp: 98.2 F (36.8 C)  SpO2: 99%   Filed Weights   01/17/21 0831  Weight: 135 lb (61.2 kg)   Body mass index is 22.47 kg/m.  BP Readings from Last 3 Encounters:  01/17/21 116/72  08/08/20 (!) 161/80  06/12/20 (!) 135/96    Wt Readings from Last 3 Encounters:  01/17/21 135 lb (61.2 kg)  08/08/20 131 lb (59.4 kg)  05/10/20 127 lb 8 oz (57.8 kg)    Depression screen [facility] 2/9 01/17/2021 01/13/2020 11/01/2018  Decreased Interest 0 0 0  Down, Depressed, Hopeless 1 0 0  PHQ - 2 Score 1 0 0  Altered sleeping 1 - -  Tired, decreased energy 1 - -  Change in appetite 0 - -  Feeling bad or failure about yourself  0 - -  Trouble concentrating 0 - -  Moving slowly or fidgety/restless 0 - -  Suicidal thoughts 0 - -  PHQ-9 Score 3 - -  Difficult doing work/chores Not difficult at all - -    GAD 7 : Generalized Anxiety Score 01/17/2021  Nervous, Anxious, on Edge 1   Control/stop worrying 1  Worry too much - different things 1  Trouble relaxing 1  Restless 0  Easily annoyed or irritable 2  Afraid - awful might happen 0  Total GAD 7 Score 6  Anxiety Difficulty Somewhat difficult        Physical Exam Constitutional: She appears well-developed and well-nourished. No distress.  HENT:  Head: Normocephalic and atraumatic.  Right Ear: External ear normal. Normal ear canal and TM Left Ear: External ear normal.  Normal ear canal and TM Mouth/Throat: Oropharynx  is clear and moist.  Eyes: Conjunctivae and EOM are normal.  Neck: Neck supple. No tracheal deviation present.  Thyromegaly present.  No carotid bruit  Cardiovascular: Normal rate, regular rhythm and normal heart sounds.   No murmur heard.  No edema. Pulmonary/Chest: Effort normal and breath sounds normal. No respiratory distress. She has no wheezes. She has no rales.  Breast: deferred   Abdominal: Soft. She exhibits no distension. There is no tenderness.  Lymphadenopathy: She has no cervical adenopathy.  Skin: Skin is warm and dry. She is not diaphoretic.  Psychiatric: She has a normal mood and affect. Her behavior is normal.        Assessment & Plan:   Physical exam: Screening blood work    ordered Immunizations    Had covid booster Gyn   Up to date  Exercise  Somewhat regular - walking Weight  normal Substance abuse  none   Screened for depression using the PHQ 9 scale.  No evidence of depression.   Screened for anxiety using GAD7 Scale.  She does have some mild anxiety, but does not feel like she needs medication for this.  Most of it she feels is typical mom anxiety.    See Problem List for Assessment and Plan of chronic medical problems.

## 2021-01-16 NOTE — Patient Instructions (Addendum)
Blood work was ordered.     Medications changes include :  none   Your prescription(s) have been submitted to your pharmacy. Please take as directed and contact our office if you believe you are having problem(s) with the medication(s).   Please followup in 1 year    Health Maintenance, Female Adopting a healthy lifestyle and getting preventive care are important in promoting health and wellness. Ask your health care provider about:  The right schedule for you to have regular tests and exams.  Things you can do on your own to prevent diseases and keep yourself healthy. What should I know about diet, weight, and exercise? Eat a healthy diet  Eat a diet that includes plenty of vegetables, fruits, low-fat dairy products, and lean protein.  Do not eat a lot of foods that are high in solid fats, added sugars, or sodium.   Maintain a healthy weight Body mass index (BMI) is used to identify weight problems. It estimates body fat based on height and weight. Your health care provider can help determine your BMI and help you achieve or maintain a healthy weight. Get regular exercise Get regular exercise. This is one of the most important things you can do for your health. Most adults should:  Exercise for at least 150 minutes each week. The exercise should increase your heart rate and make you sweat (moderate-intensity exercise).  Do strengthening exercises at least twice a week. This is in addition to the moderate-intensity exercise.  Spend less time sitting. Even light physical activity can be beneficial. Watch cholesterol and blood lipids Have your blood tested for lipids and cholesterol at 37 years of age, then have this test every 5 years. Have your cholesterol levels checked more often if:  Your lipid or cholesterol levels are high.  You are older than 37 years of age.  You are at high risk for heart disease. What should I know about cancer screening? Depending on your  health history and family history, you may need to have cancer screening at various ages. This may include screening for:  Breast cancer.  Cervical cancer.  Colorectal cancer.  Skin cancer.  Lung cancer. What should I know about heart disease, diabetes, and high blood pressure? Blood pressure and heart disease  High blood pressure causes heart disease and increases the risk of stroke. This is more likely to develop in people who have high blood pressure readings, are of African descent, or are overweight.  Have your blood pressure checked: ? Every 3-5 years if you are 93-31 years of age. ? Every year if you are 49 years old or older. Diabetes Have regular diabetes screenings. This checks your fasting blood sugar level. Have the screening done:  Once every three years after age 70 if you are at a normal weight and have a low risk for diabetes.  More often and at a younger age if you are overweight or have a high risk for diabetes. What should I know about preventing infection? Hepatitis B If you have a higher risk for hepatitis B, you should be screened for this virus. Talk with your health care provider to find out if you are at risk for hepatitis B infection. Hepatitis C Testing is recommended for:  Everyone born from [birth year] through 1967.  Anyone with known risk factors for hepatitis C. Sexually transmitted infections (STIs)  Get screened for STIs, including gonorrhea and chlamydia, if: ? You are sexually active and are younger than 37 years of age. ?  You are older than 37 years of age and your health care provider tells you that you are at risk for this type of infection. ? Your sexual activity has changed since you were last screened, and you are at increased risk for chlamydia or gonorrhea. Ask your health care provider if you are at risk.  Ask your health care provider about whether you are at high risk for HIV. Your health care provider may recommend a prescription  medicine to help prevent HIV infection. If you choose to take medicine to prevent HIV, you should first get tested for HIV. You should then be tested every 3 months for as long as you are taking the medicine. Pregnancy  If you are about to stop having your period (premenopausal) and you may become pregnant, seek counseling before you get pregnant.  Take 400 to 800 micrograms (mcg) of folic acid every day if you become pregnant.  Ask for birth control (contraception) if you want to prevent pregnancy. Osteoporosis and menopause Osteoporosis is a disease in which the bones lose minerals and strength with aging. This can result in bone fractures. If you are 69 years old or older, or if you are at risk for osteoporosis and fractures, ask your health care provider if you should:  Be screened for bone loss.  Take a calcium or vitamin D supplement to lower your risk of fractures.  Be given hormone replacement therapy (HRT) to treat symptoms of menopause. Follow these instructions at home: Lifestyle  Do not use any products that contain nicotine or tobacco, such as cigarettes, e-cigarettes, and chewing tobacco. If you need help quitting, ask your health care provider.  Do not use street drugs.  Do not share needles.  Ask your health care provider for help if you need support or information about quitting drugs. Alcohol use  Do not drink alcohol if: ? Your health care provider tells you not to drink. ? You are pregnant, may be pregnant, or are planning to become pregnant.  If you drink alcohol: ? Limit how much you use to 0-1 drink a day. ? Limit intake if you are breastfeeding.  Be aware of how much alcohol is in your drink. In the [place] one drink equals one 12 oz bottle of beer (355 mL), one 5 oz glass of wine (148 mL), or one 1 oz glass of hard liquor (44 mL). General instructions  Schedule regular health, dental, and eye exams.  Stay current with your vaccines.  Tell your health  care provider if: ? You often feel depressed. ? You have ever been abused or do not feel safe at home. Summary  Adopting a healthy lifestyle and getting preventive care are important in promoting health and wellness.  Follow your health care provider's instructions about healthy diet, exercising, and getting tested or screened for diseases.  Follow your health care provider's instructions on monitoring your cholesterol and blood pressure. This information is not intended to replace advice given to you by your health care provider. Make sure you discuss any questions you have with your health care provider. Document Revised: 09/15/2018 Document Reviewed: 09/15/2018 Elsevier Patient Education  2021 [employer].

## 2021-01-17 ENCOUNTER — Ambulatory Visit (INDEPENDENT_AMBULATORY_CARE_PROVIDER_SITE_OTHER): Payer: 59 | Admitting: Internal Medicine

## 2021-01-17 ENCOUNTER — Other Ambulatory Visit (HOSPITAL_COMMUNITY): Payer: Self-pay

## 2021-01-17 ENCOUNTER — Encounter: Payer: Self-pay | Admitting: Internal Medicine

## 2021-01-17 ENCOUNTER — Other Ambulatory Visit: Payer: Self-pay

## 2021-01-17 VITALS — BP 116/72 | HR 77 | Temp 98.2°F | Ht 65.0 in | Wt 135.0 lb

## 2021-01-17 DIAGNOSIS — K219 Gastro-esophageal reflux disease without esophagitis: Secondary | ICD-10-CM

## 2021-01-17 DIAGNOSIS — G43009 Migraine without aura, not intractable, without status migrainosus: Secondary | ICD-10-CM | POA: Diagnosis not present

## 2021-01-17 DIAGNOSIS — E010 Iodine-deficiency related diffuse (endemic) goiter: Secondary | ICD-10-CM

## 2021-01-17 DIAGNOSIS — Z0000 Encounter for general adult medical examination without abnormal findings: Secondary | ICD-10-CM

## 2021-01-17 DIAGNOSIS — I10 Essential (primary) hypertension: Secondary | ICD-10-CM | POA: Diagnosis not present

## 2021-01-17 LAB — TSH: TSH: 0.71 u[IU]/mL (ref 0.35–4.50)

## 2021-01-17 LAB — CBC WITH DIFFERENTIAL/PLATELET
Basophils Absolute: 0 10*3/uL (ref 0.0–0.1)
Basophils Relative: 0.6 % (ref 0.0–3.0)
Eosinophils Absolute: 0.3 10*3/uL (ref 0.0–0.7)
Eosinophils Relative: 7.9 % — ABNORMAL HIGH (ref 0.0–5.0)
HCT: 28.6 % — ABNORMAL LOW (ref 36.0–46.0)
Hemoglobin: 8.6 g/dL — ABNORMAL LOW (ref 12.0–15.0)
Lymphocytes Relative: 26.8 % (ref 12.0–46.0)
Lymphs Abs: 1.2 10*3/uL (ref 0.7–4.0)
MCHC: 30.1 g/dL (ref 30.0–36.0)
MCV: 61.9 fl — ABNORMAL LOW (ref 78.0–100.0)
Monocytes Absolute: 0.5 10*3/uL (ref 0.1–1.0)
Monocytes Relative: 10.7 % (ref 3.0–12.0)
Neutro Abs: 2.4 10*3/uL (ref 1.4–7.7)
Neutrophils Relative %: 54 % (ref 43.0–77.0)
Platelets: 354 10*3/uL (ref 150.0–400.0)
RBC: 4.63 Mil/uL (ref 3.87–5.11)
RDW: 19.4 % — ABNORMAL HIGH (ref 11.5–15.5)
WBC: 4.4 10*3/uL (ref 4.0–10.5)

## 2021-01-17 LAB — COMPREHENSIVE METABOLIC PANEL
ALT: 15 U/L (ref 0–35)
AST: 20 U/L (ref 0–37)
Albumin: 4.1 g/dL (ref 3.5–5.2)
Alkaline Phosphatase: 64 U/L (ref 39–117)
BUN: 17 mg/dL (ref 6–23)
CO2: 31 mEq/L (ref 19–32)
Calcium: 9.4 mg/dL (ref 8.4–10.5)
Chloride: 102 mEq/L (ref 96–112)
Creatinine, Ser: 0.64 mg/dL (ref 0.40–1.20)
GFR: 113.13 mL/min (ref >60.00)
Glucose, Bld: 93 mg/dL (ref 70–99)
Potassium: 4.1 mEq/L (ref 3.5–5.1)
Sodium: 137 mEq/L (ref 135–145)
Total Bilirubin: 0.6 mg/dL (ref 0.2–1.2)
Total Protein: 7.8 g/dL (ref 6.0–8.3)

## 2021-01-17 LAB — LIPID PANEL
Cholesterol: 148 mg/dL (ref 0–200)
HDL: 60.3 mg/dL (ref >39.00)
LDL Cholesterol: 69 mg/dL (ref 0–99)
NonHDL: 87.25
Total CHOL/HDL Ratio: 2
Triglycerides: 92 mg/dL (ref 0.0–149.0)
VLDL: 18.4 mg/dL (ref 0.0–40.0)

## 2021-01-17 MED ORDER — PROPRANOLOL HCL 80 MG PO TABS
80.0000 mg | ORAL_TABLET | Freq: Two times a day (BID) | ORAL | 3 refills | Status: DC
  Filled 2021-01-17: qty 60, 30d supply, fill #0
  Filled 2021-02-22: qty 60, 30d supply, fill #1
  Filled 2021-04-01: qty 60, 30d supply, fill #2
  Filled 2021-05-07: qty 60, 30d supply, fill #3
  Filled 2021-06-13: qty 60, 30d supply, fill #4
  Filled 2021-07-15: qty 60, 30d supply, fill #5
  Filled 2021-08-23: qty 60, 30d supply, fill #6
  Filled 2021-09-19: qty 60, 30d supply, fill #7
  Filled 2021-10-23: qty 60, 30d supply, fill #8
  Filled 2021-11-28: qty 60, 30d supply, fill #9
  Filled 2022-01-09: qty 60, 30d supply, fill #10

## 2021-01-17 NOTE — Assessment & Plan Note (Signed)
Chronic GERD controlled Continue pantoprazole 40 mg daily -- try qod Tried ot come off of it and was symptomatic

## 2021-01-17 NOTE — Assessment & Plan Note (Signed)
Chronic No change TSH

## 2021-01-17 NOTE — Assessment & Plan Note (Signed)
Chronic Following with neuro  Doing glacanezummab-gnlm injections monthly Has migraines 3 times a week - sometimes they are mild and she does not need medication Taking amerge prn - maybe 2-3 / month zofran prn

## 2021-01-17 NOTE — Assessment & Plan Note (Addendum)
Chronic BP well controlled Continue propranolol 80 mg BID Discussed that we can consider changing this to once a day medication at some point if she wants, but originally she was on this for her migraines and it may be helping some so we will continue as is cmp

## 2021-01-23 ENCOUNTER — Other Ambulatory Visit (HOSPITAL_COMMUNITY): Payer: Self-pay

## 2021-01-23 MED FILL — Galcanezumab-gnlm Subcutaneous Soln Auto-Injector 120 MG/ML: SUBCUTANEOUS | 30 days supply | Qty: 1 | Fill #0 | Status: AC

## 2021-01-28 ENCOUNTER — Telehealth: Payer: Self-pay

## 2021-01-28 NOTE — Telephone Encounter (Signed)
Spoke to pt and she was not able to do VV in afternoon,  Plugged her in the AM, as a VV. She was doing ok at this time.

## 2021-01-28 NOTE — Telephone Encounter (Signed)
Patient left a voicemail this morning asking for a call to reschedule her appointment tomorrow.

## 2021-01-29 ENCOUNTER — Telehealth (INDEPENDENT_AMBULATORY_CARE_PROVIDER_SITE_OTHER): Payer: 59 | Admitting: Adult Health

## 2021-01-29 ENCOUNTER — Telehealth: Payer: 59 | Admitting: Adult Health

## 2021-01-29 DIAGNOSIS — G43709 Chronic migraine without aura, not intractable, without status migrainosus: Secondary | ICD-10-CM | POA: Diagnosis not present

## 2021-01-29 NOTE — Progress Notes (Addendum)
PATIENT: Jasmine Flores DOB: [birth year]  REASON FOR VISIT: follow up HISTORY FROM: patient  Virtual Visit via Video Note  I connected with Jasmine Flores on 01/29/21 at  8:00 AM EDT by a video enabled telemedicine application located remotely at [facility] and verified that I am speaking with the correct person using two identifiers who was located at their own home.   I discussed the limitations of evaluation and management by telemedicine and the availability of in person appointments. The patient expressed understanding and agreed to proceed.   PATIENT: Jasmine Flores DOB: December 20, 1985  REASON FOR VISIT: follow up HISTORY FROM: patient  HISTORY OF PRESENT ILLNESS: Today 01/29/21:  Jasmine Flores is  A 35 year old female with a history of migraine headaches. She joins me today for video visit.  She reports that her headaches have improved this month.  She is only had 2 severe headaches that responded well to naratriptan.  She continues to use Emgality.  Denies any new issues.  12/11/20: Jasmine Flores is a 36 year old female with a history of migraine headaches.  She returns today for follow-up.  She states that she has been having daily headaches.  She states that at the end of January she had Covid.  She developed daily headaches.  She was prescribed a steroid Dosepak by Dr. Lavell Anchors at the beginning of February.  She states that her headaches resolved while she was taking steroid.  They have slowly came back.  She reports her headache severity varies day-to-day.  She states that she has had some headache free days but not many.  She uses the Amerge but was only given 4 tablets by the pharmacy.  She states that she is only taking Excedrin 3 times a week.  She returns today for an evaluation.  HISTORY Interval history: Ajovy worked decrease a little bit but not working anymore, now back to daily migraines. She was not fan of the injections. The  maxalt did not help. We discussed Emgality and she doesn't like the injections. Once the daily headaches came back she started taking excedrin again. We discussed oral medications, the new Qulipta and botox.   Patient complains of symptoms per HPI as well as the following symptoms: daily headaches . Pertinent negatives and positives per HPI. All others negative   HPI:  Jasmine Flores is a 34 y.o. female here as requested by Binnie Rail, MD for migraines.  I reviewed Dr. Quay Burow notes: Patient is having 16 migraines a month, worse during her menses), she usually wakes up with renal colic much, we discussed Topamax nortriptyline Aimovig, she is on Fioricet for acute management and Zofran for nausea with her migraines.  Blood pressures are well controlled.  She was continued on.  At prior appointments she also discussed migraines, reporting at least 4/week, she decreased her Excedrin Migraine because of her gastritis, she was started on propranolol in September 2022 also help with blood pressure as well as migraine prophylaxis.  They also discussed Imitrex or Zomig/Maxalt if needed.  She is here alone and reports migraines diagnosed in her 12a, she tried topamax, nortriptyline, propranolol. Stopped taking excedrin or OTC medications, her stomach getting gastritis. Migraines have been worsening since 2019, after her second child, also in the setting of stress. Worse during the menses, the week before that is the worst. She usually wakes up with them every day, skipping meals can trigger, unilateral and usually start on the right and  spreads, pounding/pulsating/throbbing, nausea, vomiting, photo/phonophobia, turnig off the light helps, movement makes it worse and has to be still if bending over it makes it worse and keeps getting worse. Daily headaches and more than 15 migraine days that are moderately severe to severe for > 1 year. She is  A light sleeper but no snoring and no significant fatigue  during the day or dry mouth in the morning, she does have young kids and she doesn't have a night where she doesn't sleep throughout the night. She is using condoms and no pregnancy plans. No other focal neurologic deficits, associated symptoms, inciting events or modifiable factors.  Reviewed notes, labs and imaging from outside physicians, which showed:  MRi brain 2017: reviewed images, agree with the following  Postop pituitary resection. No prior studies available. Complex appearance of the sella. 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 The pituitary appears to be up lifted. There is low signal material in the anterior sella which may be graft material from prior trans-sphenoidal resection. Less likely but possible is flow void related to aneurysm or vascular abnormality or gas. For this reason, CTA head is recommended for further evaluation to rule out aneurysm and evaluate the wall of the sphenoid sinus.   Reviewed CTA report only, as below:  IMPRESSION: 1. The unusual low T1 signal area along the anterior sella turcica described on the recent MRI corresponds to a 12 mm mildly lobulated focus of extremely hyperdense material which I favor is either operative glue or cement. This does abut the right ICA siphon, but there is no associated aneurysm or ICA stenosis. 2. Otherwise negative intracranial CTA, and normal CT appearance of the brain. TSH, CMP normal 01/13/2020   REVIEW OF SYSTEMS: Out of a complete 14 system review of symptoms, the patient complains only of the following symptoms, and all other reviewed systems are negative.  ALLERGIES: Allergies  Allergen Reactions  . Shellfish Allergy Itching, Swelling, Shortness Of Breath and Anaphylaxis  . Sulfa Antibiotics Itching and Swelling    Patient doesn't ever recall when this happened.    HOME MEDICATIONS: Outpatient Medications Prior to Visit   Medication Sig Dispense Refill  . Galcanezumab-gnlm 120 MG/ML SOAJ INJECT 120 MG INTO THE SKIN EVERY 30 (THIRTY) DAYS. 1 mL 11  . Multiple Vitamin (MULTIVITAMIN) tablet Take 1 tablet by mouth daily.    . naratriptan (AMERGE) 2.5 MG tablet TAKE 1 TABLET BY MOUTH AT ONSET OF HEADACHE/MIGRAINE. IF RETURNS OR DOES NOT RESOLVE MAY REPEAT AFTER 2 HOURS DO NOT EXCEED 5MG  IN 24 HOURS 9 tablet 11  . ondansetron (ZOFRAN ODT) 4 MG disintegrating tablet Take 1 tablet (4 mg total) by mouth every 8 (eight) hours as needed (for nausea associated with migraines). 30 tablet 3  . pantoprazole (PROTONIX) 40 MG tablet TAKE 1 TABLET (40 MG TOTAL) BY MOUTH DAILY. 30 tablet 2  . PRESCRIPTION MEDICATION Take 60 mg by mouth daily. QULIPTA    . propranolol (INDERAL) 80 MG tablet Take 1 tablet (80 mg total) by mouth 2 (two) times daily. 180 tablet 3   No facility-administered medications prior to visit.    PAST MEDICAL HISTORY: Past Medical History:  Diagnosis Date  . Alpha trait thalassemia   . Anemia   . Asthma   . Headache   . HSV infection   . Migraines   . Positive TB test   . Pregnancy induced hypertension   .  Vaginal Pap smear, abnormal     PAST SURGICAL HISTORY: Past Surgical History:  Procedure Laterality Date  . 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 TONSILECTOMY, ADENOIDECTOMY, BILATERAL MYRINGOTOMY AND TUBES    . TONSILLECTOMY     and adenoids  . WISDOM TOOTH EXTRACTION      FAMILY HISTORY: Family History  Problem Relation Age of Onset  . Diabetes Mother   . Hypertension Mother   . Diabetes Father   . Hypertension Father   . Stroke Father   . Hypertension Maternal Grandmother   . Diabetes Maternal Grandmother   . Hypertension Maternal Grandfather   . Diabetes Maternal Grandfather   . Colon cancer Neg Hx   . Esophageal cancer Neg Hx   . Inflammatory bowel disease Neg Hx   . Liver disease Neg Hx   . Pancreatic cancer Neg Hx   . Stomach cancer Neg Hx   . Rectal cancer Neg Hx    . Migraines Neg Hx     SOCIAL HISTORY: Social History   Socioeconomic History  . Marital status: Single    Spouse name: Not on file  . Number of children: 2  . Years of education: Not on file  . Highest education level: Master's degree (e.g., MA, MS, MEng, MEd, MSW, MBA)  Occupational History  . Not on file  Tobacco Use  . Smoking status: Never Smoker  . Smokeless tobacco: Never Used  Vaping Use  . Vaping Use: Never used  Substance and Sexual Activity  . Alcohol use: No  . Drug use: No  . Sexual activity: Yes    Birth control/protection: None  Other Topics Concern  . Not on file  Social History Narrative   Works for [employer] at home with her fiance and her children   Right handed   Caffeine: has one can of soda/day   Social Determinants of Radio broadcast assistant Strain: Not on file  Food Insecurity: Not on file  Transportation Needs: Not on file  Physical Activity: Not on file  Stress: Not on file  Social Connections: Not on file  Intimate Partner Violence: Not on file      PHYSICAL EXAM Generalized: Well developed, in no acute distress   Neurological examination  Mentation: Alert oriented to time, place, history taking. Follows all commands speech and language fluent Cranial nerve II-XII:Extraocular movements were full. Facial symmetry noted. uvula tongue midline. Head turning and shoulder shrug  were normal and symmetric. Motor: Good strength throughout subjectively per patient Sensory: Sensory testing is intact to soft touch on all 4 extremities subjectively per patient Coordination: Cerebellar testing reveals good finger-nose-finger  Gait and station: Patient is able to stand from a seated position. gait is normal.  Reflexes: UTA  DIAGNOSTIC DATA (LABS, IMAGING, TESTING) - I reviewed patient records, labs, notes, testing and imaging myself where available.  Lab Results  Component Value Date   WBC 4.4 01/17/2021   HGB 8.6  Repeated and verified X2. (L) 01/17/2021   HCT 28.6 (L) 01/17/2021   MCV 61.9 Repeated and verified X2. (L) 01/17/2021   PLT 354.0 01/17/2021      Component Value Date/Time   NA 137 01/17/2021 0904   K 4.1 01/17/2021 0904   CL 102 01/17/2021 0904   CO2 31 01/17/2021 0904   GLUCOSE 93 01/17/2021 0904   BUN 17 01/17/2021 0904   CREATININE 0.64 01/17/2021 0904   CALCIUM 9.4 01/17/2021 0904   PROT  7.8 01/17/2021 0904   ALBUMIN 4.1 01/17/2021 0904   AST 20 01/17/2021 0904   ALT 15 01/17/2021 0904   ALKPHOS 64 01/17/2021 0904   BILITOT 0.6 01/17/2021 0904   GFRNONAA >60 06/07/2019 1530   GFRAA >60 06/07/2019 1530   Lab Results  Component Value Date   CHOL 148 01/17/2021   HDL 60.30 01/17/2021   LDLCALC 69 01/17/2021   TRIG 92.0 01/17/2021   CHOLHDL 2 01/17/2021   No results found for: HGBA1C No results found for: VITAMINB12 Lab Results  Component Value Date   TSH 0.71 01/17/2021      ASSESSMENT AND PLAN 35 y.o. year old female  has a past medical history of Alpha trait thalassemia, Anemia, Asthma, Headache, HSV infection, Migraines, Positive TB test, Pregnancy induced hypertension, and Vaginal Pap smear, abnormal. here with:  1.  Migraine headache  -Continue Emgality monthly -Continue naratriptan 2.5 mg as an abortive therapy -Advised if symptoms worsen or she develops new symptoms she should let us know  Follow-up in 6 months or sooner if needed  She has tried nortriptyline, topamax, propranolol, Ajovy, imitrex, maxalt,  Qulipta. emgality  naratriptan    I spent 30 minutes of face-to-face and non-face-to-face time with patient.  This included previsit chart review, lab review, study review, order entry, electronic health record documentation, patient education.    Ward Givens, MSN, NP-C 01/29/2021, 8:11 AM [facility] [address] [place], [place] [address] [phone] agree with assessment and plan as stated.     Sarina Ill, MD [facility]

## 2021-02-05 ENCOUNTER — Other Ambulatory Visit (HOSPITAL_COMMUNITY): Payer: Self-pay

## 2021-02-05 MED FILL — Naratriptan HCl Tab 2.5 MG (Base Equiv): ORAL | 30 days supply | Qty: 4 | Fill #1 | Status: AC

## 2021-02-22 ENCOUNTER — Other Ambulatory Visit (HOSPITAL_COMMUNITY): Payer: Self-pay

## 2021-02-22 MED FILL — Galcanezumab-gnlm Subcutaneous Soln Auto-Injector 120 MG/ML: SUBCUTANEOUS | 30 days supply | Qty: 1 | Fill #1 | Status: AC

## 2021-03-20 ENCOUNTER — Other Ambulatory Visit (HOSPITAL_COMMUNITY): Payer: Self-pay

## 2021-03-20 MED FILL — Naratriptan HCl Tab 2.5 MG (Base Equiv): ORAL | 30 days supply | Qty: 4 | Fill #2 | Status: AC

## 2021-03-27 MED FILL — Galcanezumab-gnlm Subcutaneous Soln Auto-Injector 120 MG/ML: SUBCUTANEOUS | 30 days supply | Qty: 1 | Fill #2 | Status: CN

## 2021-03-28 ENCOUNTER — Other Ambulatory Visit (HOSPITAL_COMMUNITY): Payer: Self-pay

## 2021-03-28 MED FILL — Galcanezumab-gnlm Subcutaneous Soln Auto-Injector 120 MG/ML: SUBCUTANEOUS | 30 days supply | Qty: 1 | Fill #2 | Status: CN

## 2021-03-29 ENCOUNTER — Other Ambulatory Visit (HOSPITAL_COMMUNITY): Payer: Self-pay

## 2021-03-29 MED FILL — Galcanezumab-gnlm Subcutaneous Soln Auto-Injector 120 MG/ML: SUBCUTANEOUS | 30 days supply | Qty: 1 | Fill #2 | Status: CN

## 2021-04-01 ENCOUNTER — Other Ambulatory Visit (HOSPITAL_COMMUNITY): Payer: Self-pay

## 2021-04-01 ENCOUNTER — Telehealth: Payer: Self-pay | Admitting: *Deleted

## 2021-04-01 NOTE — Telephone Encounter (Signed)
Completed renewal Emgality PA on Cover My Meds. Key: [id number]. Awaiting determination from [facility].

## 2021-04-01 NOTE — Telephone Encounter (Signed)
Request Reference Number: [id number]. EMGALITY INJ 120MG /ML is approved through 04/01/2022. Your patient may now fill this prescription and it will be covered.

## 2021-04-05 ENCOUNTER — Ambulatory Visit: Payer: 59

## 2021-04-05 ENCOUNTER — Telehealth: Payer: Self-pay

## 2021-04-05 ENCOUNTER — Other Ambulatory Visit (HOSPITAL_COMMUNITY): Payer: Self-pay

## 2021-04-05 DIAGNOSIS — Z111 Encounter for screening for respiratory tuberculosis: Secondary | ICD-10-CM

## 2021-04-05 DIAGNOSIS — Z0184 Encounter for antibody response examination: Secondary | ICD-10-CM

## 2021-04-05 MED FILL — Galcanezumab-gnlm Subcutaneous Soln Auto-Injector 120 MG/ML: SUBCUTANEOUS | 30 days supply | Qty: 1 | Fill #2 | Status: AC

## 2021-04-05 NOTE — Addendum Note (Signed)
Addended by: Binnie Rail on: 04/05/2021 04:34 PM   Modules accepted: Orders

## 2021-04-05 NOTE — Telephone Encounter (Signed)
added

## 2021-04-05 NOTE — Telephone Encounter (Signed)
Pt on nurse visit schedule for TB, tetanus, MMR, varicella. Last OV: 01/17/21 - no mention of immunization. Will send to PCP for orders

## 2021-04-05 NOTE — Addendum Note (Signed)
Addended by: Binnie Rail on: 04/05/2021 11:58 AM   Modules accepted: Orders

## 2021-04-05 NOTE — Telephone Encounter (Signed)
Tdap she does not need.  MMR and varicella can be tested for to see if she has immunity.  Most likely she has immunity to these.  I will order titers for both

## 2021-04-05 NOTE — Telephone Encounter (Signed)
Pt states she has had a positive TB in the past & has gotten the Quantiferon Gold test done & would like this to be added to her blood work.  She will come on Tues July 5 to get blood work done.  Advised on tetanus & pt verb understanding.  Nurse visit appt canceled for today; added to lab schedule for July 5

## 2021-04-05 NOTE — Telephone Encounter (Signed)
LVM instructing pt to call back re: nurse visit & PCP notes. Will also send my chart message: "I tried calling & left a message.  Dr Quay Burow put in lab work to determine if you have antibodies to Varicella & MMR, so instead of receiving those vaccinations today, you can stop in the lab to have your blood drawn.  You are not due a tetanus as we have on file that you last received on in 2019 - it would not be due until 2029.  You have I don't recommend get a TB test today unless you have paperwork & can get it read at an urgent care on Sunday or Monday (we are closed Monday)."

## 2021-04-09 ENCOUNTER — Other Ambulatory Visit: Payer: 59

## 2021-04-09 ENCOUNTER — Telehealth: Payer: Self-pay | Admitting: Internal Medicine

## 2021-04-09 NOTE — Telephone Encounter (Signed)
Type of form received: [facility] P2P Immunization Form   Received by: Amy   Form should be Faxed to: n/a  Form should be mailed to:  n/a  Is patient requesting call for pickup: Y  Form placed in the Provider's box.  Was patient informed of  7-10 business day turn around (Y/N)? N

## 2021-04-10 ENCOUNTER — Other Ambulatory Visit (HOSPITAL_COMMUNITY): Payer: Self-pay

## 2021-04-10 NOTE — Telephone Encounter (Signed)
Form received. Waiting for labs to drop so I can update form and send.

## 2021-04-11 LAB — QUANTIFERON-TB GOLD PLUS
Mitogen-NIL: >10 IU/mL
NIL: 0.02 IU/mL
QuantiFERON-TB Gold Plus: NEGATIVE
TB1-NIL: 0 IU/mL
TB2-NIL: 0 IU/mL

## 2021-04-11 LAB — MEASLES/MUMPS/RUBELLA IMMUNITY
Mumps IgG: 28.1 AU/mL
Rubella: 12.6 Index
Rubeola IgG: 121 AU/mL

## 2021-04-11 LAB — VARICELLA ZOSTER ANTIBODY, IGG: Varicella IgG: 304.8 index

## 2021-04-12 NOTE — Telephone Encounter (Signed)
Forms completed and patient contacted.  Left up front for pick up.

## 2021-04-16 ENCOUNTER — Other Ambulatory Visit (HOSPITAL_COMMUNITY): Payer: Self-pay

## 2021-04-16 MED FILL — Naratriptan HCl Tab 2.5 MG (Base Equiv): ORAL | 30 days supply | Qty: 4 | Fill #3 | Status: AC

## 2021-04-17 ENCOUNTER — Other Ambulatory Visit (HOSPITAL_COMMUNITY): Payer: Self-pay

## 2021-04-29 ENCOUNTER — Other Ambulatory Visit (HOSPITAL_COMMUNITY): Payer: Self-pay

## 2021-04-29 MED FILL — Galcanezumab-gnlm Subcutaneous Soln Auto-Injector 120 MG/ML: SUBCUTANEOUS | 30 days supply | Qty: 1 | Fill #3 | Status: AC

## 2021-05-08 ENCOUNTER — Other Ambulatory Visit (HOSPITAL_COMMUNITY): Payer: Self-pay

## 2021-05-13 MED FILL — Naratriptan HCl Tab 2.5 MG (Base Equiv): ORAL | 30 days supply | Qty: 4 | Fill #4 | Status: AC

## 2021-05-14 ENCOUNTER — Other Ambulatory Visit (HOSPITAL_COMMUNITY): Payer: Self-pay

## 2021-06-04 ENCOUNTER — Other Ambulatory Visit (HOSPITAL_COMMUNITY): Payer: Self-pay

## 2021-06-04 MED FILL — Galcanezumab-gnlm Subcutaneous Soln Auto-Injector 120 MG/ML: SUBCUTANEOUS | 30 days supply | Qty: 1 | Fill #4 | Status: AC

## 2021-06-05 ENCOUNTER — Other Ambulatory Visit (HOSPITAL_COMMUNITY): Payer: Self-pay

## 2021-06-05 MED ORDER — XULANE 150-35 MCG/24HR TD PTWK
1.0000 | MEDICATED_PATCH | TRANSDERMAL | 3 refills | Status: DC
  Filled 2021-06-05: qty 3, 21d supply, fill #0
  Filled 2021-07-15: qty 3, 21d supply, fill #1
  Filled 2021-08-07: qty 3, 21d supply, fill #2
  Filled 2021-08-19: qty 3, 21d supply, fill #3

## 2021-06-06 ENCOUNTER — Other Ambulatory Visit (HOSPITAL_COMMUNITY): Payer: Self-pay

## 2021-06-06 MED ORDER — TWIRLA 120-30 MCG/24HR TD PTWK
1.0000 | MEDICATED_PATCH | TRANSDERMAL | 3 refills | Status: DC
  Filled 2021-06-06: qty 1, 28d supply, fill #0
  Filled 2021-09-19 – 2022-04-21 (×3): qty 3, 21d supply, fill #0

## 2021-06-13 MED FILL — Naratriptan HCl Tab 2.5 MG (Base Equiv): ORAL | 30 days supply | Qty: 4 | Fill #5 | Status: AC

## 2021-06-14 ENCOUNTER — Other Ambulatory Visit (HOSPITAL_COMMUNITY): Payer: Self-pay

## 2021-06-22 ENCOUNTER — Encounter: Payer: Self-pay | Admitting: Internal Medicine

## 2021-06-23 MED ORDER — ONDANSETRON 4 MG PO TBDP
4.0000 mg | ORAL_TABLET | Freq: Three times a day (TID) | ORAL | 3 refills | Status: DC | PRN
  Filled 2021-06-23: qty 30, 10d supply, fill #0
  Filled 2021-09-19: qty 30, 10d supply, fill #1
  Filled 2021-12-10: qty 30, 10d supply, fill #2
  Filled 2022-04-28: qty 30, 10d supply, fill #3

## 2021-06-24 ENCOUNTER — Other Ambulatory Visit (HOSPITAL_COMMUNITY): Payer: Self-pay

## 2021-06-25 ENCOUNTER — Other Ambulatory Visit (HOSPITAL_COMMUNITY): Payer: Self-pay

## 2021-06-25 MED FILL — Naratriptan HCl Tab 2.5 MG (Base Equiv): ORAL | 30 days supply | Qty: 4 | Fill #6 | Status: CN

## 2021-06-26 ENCOUNTER — Encounter: Payer: Self-pay | Admitting: Internal Medicine

## 2021-06-27 ENCOUNTER — Other Ambulatory Visit: Payer: Self-pay

## 2021-06-27 ENCOUNTER — Ambulatory Visit (INDEPENDENT_AMBULATORY_CARE_PROVIDER_SITE_OTHER): Payer: 59

## 2021-06-27 DIAGNOSIS — Z23 Encounter for immunization: Secondary | ICD-10-CM | POA: Diagnosis not present

## 2021-07-02 ENCOUNTER — Other Ambulatory Visit (HOSPITAL_COMMUNITY): Payer: Self-pay

## 2021-07-02 MED FILL — Galcanezumab-gnlm Subcutaneous Soln Auto-Injector 120 MG/ML: SUBCUTANEOUS | 30 days supply | Qty: 1 | Fill #5 | Status: AC

## 2021-07-05 ENCOUNTER — Other Ambulatory Visit (HOSPITAL_COMMUNITY): Payer: Self-pay

## 2021-07-09 ENCOUNTER — Other Ambulatory Visit (HOSPITAL_COMMUNITY): Payer: Self-pay

## 2021-07-15 ENCOUNTER — Other Ambulatory Visit (HOSPITAL_COMMUNITY): Payer: Self-pay

## 2021-07-16 ENCOUNTER — Other Ambulatory Visit (HOSPITAL_COMMUNITY): Payer: Self-pay

## 2021-07-16 MED FILL — Naratriptan HCl Tab 2.5 MG (Base Equiv): ORAL | 30 days supply | Qty: 4 | Fill #6 | Status: AC

## 2021-07-22 ENCOUNTER — Other Ambulatory Visit: Payer: Self-pay | Admitting: Neurology

## 2021-07-22 ENCOUNTER — Other Ambulatory Visit (HOSPITAL_COMMUNITY): Payer: Self-pay

## 2021-07-22 MED ORDER — METHYLPREDNISOLONE 4 MG PO TBPK
ORAL_TABLET | ORAL | 1 refills | Status: DC
  Filled 2021-07-22: qty 21, 6d supply, fill #0

## 2021-08-06 ENCOUNTER — Telehealth: Payer: 59 | Admitting: Adult Health

## 2021-08-07 MED FILL — Naratriptan HCl Tab 2.5 MG (Base Equiv): ORAL | 30 days supply | Qty: 4 | Fill #7 | Status: AC

## 2021-08-07 MED FILL — Galcanezumab-gnlm Subcutaneous Soln Auto-Injector 120 MG/ML: SUBCUTANEOUS | 30 days supply | Qty: 1 | Fill #6 | Status: AC

## 2021-08-08 ENCOUNTER — Other Ambulatory Visit (HOSPITAL_COMMUNITY): Payer: Self-pay

## 2021-08-13 ENCOUNTER — Telehealth: Payer: 59 | Admitting: Adult Health

## 2021-08-19 ENCOUNTER — Other Ambulatory Visit (HOSPITAL_COMMUNITY): Payer: Self-pay

## 2021-08-22 ENCOUNTER — Other Ambulatory Visit (HOSPITAL_COMMUNITY): Payer: Self-pay

## 2021-08-23 ENCOUNTER — Other Ambulatory Visit (HOSPITAL_COMMUNITY): Payer: Self-pay

## 2021-09-06 ENCOUNTER — Other Ambulatory Visit: Payer: Self-pay | Admitting: Neurology

## 2021-09-06 DIAGNOSIS — G43709 Chronic migraine without aura, not intractable, without status migrainosus: Secondary | ICD-10-CM

## 2021-09-09 ENCOUNTER — Other Ambulatory Visit: Payer: Self-pay | Admitting: Neurology

## 2021-09-09 ENCOUNTER — Other Ambulatory Visit (HOSPITAL_COMMUNITY): Payer: Self-pay

## 2021-09-09 DIAGNOSIS — G43709 Chronic migraine without aura, not intractable, without status migrainosus: Secondary | ICD-10-CM

## 2021-09-10 ENCOUNTER — Other Ambulatory Visit (HOSPITAL_COMMUNITY): Payer: Self-pay

## 2021-09-10 MED ORDER — EMGALITY 120 MG/ML ~~LOC~~ SOAJ
SUBCUTANEOUS | 5 refills | Status: DC
  Filled 2021-09-10: qty 1, 30d supply, fill #0
  Filled 2021-10-14: qty 1, 30d supply, fill #1
  Filled 2021-11-13: qty 1, 30d supply, fill #2
  Filled 2021-12-10: qty 1, 30d supply, fill #3
  Filled 2022-01-09: qty 1, 30d supply, fill #4
  Filled 2022-02-09 – 2022-02-24 (×2): qty 1, 30d supply, fill #5

## 2021-09-10 MED ORDER — NARATRIPTAN HCL 2.5 MG PO TABS
ORAL_TABLET | ORAL | 5 refills | Status: DC
Start: 2021-09-10 — End: 2022-05-19
  Filled 2021-09-10: qty 4, 15d supply, fill #0
  Filled 2021-10-14: qty 4, 15d supply, fill #1
  Filled 2021-12-10: qty 4, 15d supply, fill #2
  Filled 2022-02-05: qty 4, 15d supply, fill #3
  Filled 2022-04-28: qty 4, 15d supply, fill #4

## 2021-09-19 ENCOUNTER — Other Ambulatory Visit (HOSPITAL_COMMUNITY): Payer: Self-pay

## 2021-09-19 MED ORDER — XULANE 150-35 MCG/24HR TD PTWK
MEDICATED_PATCH | TRANSDERMAL | 7 refills | Status: DC
  Filled 2021-09-19: qty 1, 30d supply, fill #0
  Filled 2021-09-20: qty 3, 21d supply, fill #0
  Filled 2021-10-14: qty 3, 21d supply, fill #1
  Filled 2021-11-13: qty 3, 21d supply, fill #2
  Filled 2021-12-10: qty 3, 21d supply, fill #3
  Filled 2022-01-09: qty 3, 21d supply, fill #4
  Filled 2022-01-29: qty 3, 21d supply, fill #5
  Filled 2022-03-04: qty 3, 21d supply, fill #6
  Filled 2022-03-26: qty 3, 21d supply, fill #7

## 2021-09-20 ENCOUNTER — Other Ambulatory Visit (HOSPITAL_COMMUNITY): Payer: Self-pay

## 2021-10-14 ENCOUNTER — Other Ambulatory Visit (HOSPITAL_COMMUNITY): Payer: Self-pay

## 2021-10-24 ENCOUNTER — Other Ambulatory Visit (HOSPITAL_COMMUNITY): Payer: Self-pay

## 2021-11-13 ENCOUNTER — Other Ambulatory Visit (HOSPITAL_COMMUNITY): Payer: Self-pay

## 2021-11-28 ENCOUNTER — Other Ambulatory Visit (HOSPITAL_COMMUNITY): Payer: Self-pay

## 2021-12-10 ENCOUNTER — Other Ambulatory Visit (HOSPITAL_COMMUNITY): Payer: Self-pay

## 2022-01-09 ENCOUNTER — Other Ambulatory Visit (HOSPITAL_COMMUNITY): Payer: Self-pay

## 2022-01-21 ENCOUNTER — Encounter: Payer: Self-pay | Admitting: Internal Medicine

## 2022-01-21 NOTE — Progress Notes (Signed)
? ? ?Subjective:  ? ? Patient ID: Jasmine Flores, female    DOB: 1985/12/07, 36 y.o.   MRN: [id number] ? ? ?This visit occurred during the SARS-CoV-2 public health emergency.  Safety protocols were in place, including screening questions prior to the visit, additional usage of staff PPE, and extensive cleaning of exam room while observing appropriate contact time as indicated for disinfecting solutions. ? ? ? ?HPI ?Jasmine Flores is here for  ?Chief Complaint  ?Patient presents with  ? Annual Exam  ? ? ?No changes in health.   ? ? ?Medications and allergies reviewed with patient and updated if appropriate. ? ? ?Current Outpatient Medications on File Prior to Visit  ?Medication Sig Dispense Refill  ? albuterol (VENTOLIN HFA) 108 (90 Base) MCG/ACT inhaler Inhale 2 puffs into the lungs every 4 (four) hours as needed.    ? Galcanezumab-gnlm (EMGALITY) 120 MG/ML SOAJ INJECT 120 MG INTO THE SKIN EVERY 30 (THIRTY) DAYS. 1 mL 5  ? Levonorgestrel-Eth Estradiol (TWIRLA) 120-30 MCG/24HR PTWK Place 1 patch onto the skin once a week as direcetd for 21 days 3 patch 3  ? naratriptan (AMERGE) 2.5 MG tablet TAKE 1 TABLET BY MOUTH AT ONSET OF HEADACHE/MIGRAINE. IF RETURNS OR DOES NOT RESOLVE, MAY REPEAT AFTER 2 HOURS. DO NOT EXCEED '5MG'$  IN 24 HOURS 9 tablet 5  ? norelgestromin-ethinyl estradiol Marilu Favre) 150-35 MCG/24HR transdermal patch Place 1 patch onto the skin once a week. 3 patch 3  ? norelgestromin-ethinyl estradiol (XULANE) 150-35 MCG/24HR transdermal patch APPLY 1 PATCH TOPICALLY ON THE SKIN  ONCE WEEKLY FOR 21 DAYS 3 patch 7  ? ondansetron (ZOFRAN-ODT) 4 MG disintegrating tablet Take 1 tablet (4 mg total) by mouth every 8 (eight) hours as needed (for nausea associated with migraines). 30 tablet 3  ? PRESCRIPTION MEDICATION Take 60 mg by mouth daily. QULIPTA    ? propranolol (INDERAL) 80 MG tablet Take 1 tablet (80 mg total) by mouth 2 (two) times daily. 180 tablet 3  ? ?No current facility-administered medications on  file prior to visit.  ? ? ?Review of Systems  ?Constitutional:  Negative for fever.  ?HENT:  Negative for trouble swallowing.   ?Eyes:  Negative for visual disturbance.  ?Respiratory:  Negative for cough, shortness of breath and wheezing.   ?Cardiovascular:  Positive for palpitations (occ). Negative for chest pain and leg swelling.  ?Gastrointestinal:  Positive for constipation (takes fiber) and nausea. Negative for abdominal pain, blood in stool and diarrhea.  ?     Occ gerd - depends on what she eats  ?Genitourinary:  Negative for dysuria.  ?Musculoskeletal:  Positive for arthralgias (occ knee pain) and back pain (occ).  ?Skin:  Negative for rash.  ?     Me new moles  ?Neurological:  Positive for headaches (migraines). Negative for dizziness and light-headedness.  ?Psychiatric/Behavioral:  Negative for dysphoric mood. The patient is not nervous/anxious.   ? ?   ?Objective:  ? ?Vitals:  ? 01/22/22 0833  ?BP: 120/74  ?Pulse: 78  ?Temp: 98.2 ?F (36.8 ?C)  ?SpO2: 99%  ? ?Filed Weights  ? 01/22/22 0833  ?Weight: 134 lb 12.8 oz (61.1 kg)  ? ?Body mass index is 22.43 kg/m?. ? ?BP Readings from Last 3 Encounters:  ?01/22/22 120/74  ?01/17/21 116/72  ?08/08/20 (!) 161/80  ? ? ?Wt Readings from Last 3 Encounters:  ?01/22/22 134 lb 12.8 oz (61.1 kg)  ?01/17/21 135 lb (61.2 kg)  ?08/08/20 131 lb (59.4 kg)  ? ? ? ?  01/22/2022  ?  8:38 AM 01/17/2021  ?  8:51 AM 01/13/2020  ?  8:43 AM 11/01/2018  ?  3:26 PM  ?Depression screen PHQ 2/9  ?Decreased Interest 0 0 0 0  ?Down, Depressed, Hopeless 1 1 0 0  ?PHQ - 2 Score 1 1 0 0  ?Altered sleeping  1    ?Tired, decreased energy  1    ?Change in appetite  0    ?Feeling bad or failure about yourself   0    ?Trouble concentrating  0    ?Moving slowly or fidgety/restless  0    ?Suicidal thoughts  0    ?PHQ-9 Score  3    ?Difficult doing work/chores  Not difficult at all    ? ? ? ? ?  01/17/2021  ?  8:52 AM  ?GAD 7 : Generalized Anxiety Score  ?Nervous, Anxious, on Edge 1  ?Control/stop worrying  1  ?Worry too much - different things 1  ?Trouble relaxing 1  ?Restless 0  ?Easily annoyed or irritable 2  ?Afraid - awful might happen 0  ?Total GAD 7 Score 6  ?Anxiety Difficulty Somewhat difficult  ? ? ? ? ?  ?Physical Exam ?Constitutional: She appears well-developed and well-nourished. No distress.  ?HENT:  ?Head: Normocephalic and atraumatic.  ?Right Ear: External ear normal. Normal ear canal and TM ?Left Ear: External ear normal.  Normal ear canal and TM ?Mouth/Throat: Oropharynx is clear and moist.  ?Eyes: Conjunctivae and EOM are normal.  ?Neck: Neck supple. No tracheal deviation present. No thyromegaly present.  ?No carotid bruit  ?Cardiovascular: Normal rate, regular rhythm and normal heart sounds.   ?No murmur heard.  No edema. ?Pulmonary/Chest: Effort normal and breath sounds normal. No respiratory distress. She has no wheezes. She has no rales.  ?Breast: deferred   ?Abdominal: Soft. She exhibits no distension. There is no tenderness.  ?Lymphadenopathy: She has no cervical adenopathy.  ?Skin: Skin is warm and dry. She is not diaphoretic.  ?Psychiatric: She has a normal mood and affect. Her behavior is normal.  ? ? ? ?Lab Results  ?Component Value Date  ? WBC 4.4 01/17/2021  ? HGB 8.6 Repeated and verified X2. (L) 01/17/2021  ? HCT 28.6 (L) 01/17/2021  ? PLT 354.0 01/17/2021  ? GLUCOSE 93 01/17/2021  ? CHOL 148 01/17/2021  ? TRIG 92.0 01/17/2021  ? HDL 60.30 01/17/2021  ? [place] 69 01/17/2021  ? ALT 15 01/17/2021  ? AST 20 01/17/2021  ? NA 137 01/17/2021  ? K 4.1 01/17/2021  ? CL 102 01/17/2021  ? CREATININE 0.64 01/17/2021  ? BUN 17 01/17/2021  ? CO2 31 01/17/2021  ? TSH 0.71 01/17/2021  ? ? ? ? ?   ?Assessment & Plan:  ? ?Physical exam: ?Screening blood work  ordered ?Exercise  walking some - not regular  ?Weight  normal ?Substance abuse  none ? ? ?Reviewed recommended immunizations. ? ? ?Health Maintenance  ?Topic Date Due  ? COVID-19 Vaccine (3 - Booster for Moderna series) 01/02/2020  ? PAP  SMEAR-Modifier  02/28/2022  ? INFLUENZA VACCINE  05/06/2022  ? TETANUS/TDAP  06/25/2028  ? Hepatitis C Screening  Completed  ? HIV Screening  Completed  ? HPV VACCINES  Aged Out  ?  ? ? ? ? ? ? ?See Problem List for Assessment and Plan of chronic medical problems. ? ? ? ? ?

## 2022-01-21 NOTE — Patient Instructions (Addendum)
? ? ? ?Blood work was ordered.   ? ? ?Medications changes include : None ? ? ?Your prescription(s) have been sent to your pharmacy.  ? ? ? ?Return in about 1 year (around 01/23/2023) for Physical Exam. ? ? ?Health Maintenance, Female ?Adopting a healthy lifestyle and getting preventive care are important in promoting health and wellness. Ask your health care provider about: ?The right schedule for you to have regular tests and exams. ?Things you can do on your own to prevent diseases and keep yourself healthy. ?What should I know about diet, weight, and exercise? ?Eat a healthy diet ? ?Eat a diet that includes plenty of vegetables, fruits, low-fat dairy products, and lean protein. ?Do not eat a lot of foods that are high in solid fats, added sugars, or sodium. ?Maintain a healthy weight ?Body mass index (BMI) is used to identify weight problems. It estimates body fat based on height and weight. Your health care provider can help determine your BMI and help you achieve or maintain a healthy weight. ?Get regular exercise ?Get regular exercise. This is one of the most important things you can do for your health. Most adults should: ?Exercise for at least 150 minutes each week. The exercise should increase your heart rate and make you sweat (moderate-intensity exercise). ?Do strengthening exercises at least twice a week. This is in addition to the moderate-intensity exercise. ?Spend less time sitting. Even light physical activity can be beneficial. ?Watch cholesterol and blood lipids ?Have your blood tested for lipids and cholesterol at 38 years of age, then have this test every 5 years. ?Have your cholesterol levels checked more often if: ?Your lipid or cholesterol levels are high. ?You are older than 38 years of age. ?You are at high risk for heart disease. ?What should I know about cancer screening? ?Depending on your health history and family history, you may need to have cancer screening at various ages. This may  include screening for: ?Breast cancer. ?Cervical cancer. ?Colorectal cancer. ?Skin cancer. ?Lung cancer. ?What should I know about heart disease, diabetes, and high blood pressure? ?Blood pressure and heart disease ?High blood pressure causes heart disease and increases the risk of stroke. This is more likely to develop in people who have high blood pressure readings or are overweight. ?Have your blood pressure checked: ?Every 3-5 years if you are 31-49 years of age. ?Every year if you are 50 years old or older. ?Diabetes ?Have regular diabetes screenings. This checks your fasting blood sugar level. Have the screening done: ?Once every three years after age 78 if you are at a normal weight and have a low risk for diabetes. ?More often and at a younger age if you are overweight or have a high risk for diabetes. ?What should I know about preventing infection? ?Hepatitis B ?If you have a higher risk for hepatitis B, you should be screened for this virus. Talk with your health care provider to find out if you are at risk for hepatitis B infection. ?Hepatitis C ?Testing is recommended for: ?Everyone born from [birth year] through 1967. ?Anyone with known risk factors for hepatitis C. ?Sexually transmitted infections (STIs) ?Get screened for STIs, including gonorrhea and chlamydia, if: ?You are sexually active and are younger than 38 years of age. ?You are older than 38 years of age and your health care provider tells you that you are at risk for this type of infection. ?Your sexual activity has changed since you were last screened, and you are at increased  risk for chlamydia or gonorrhea. Ask your health care provider if you are at risk. ?Ask your health care provider about whether you are at high risk for HIV. Your health care provider may recommend a prescription medicine to help prevent HIV infection. If you choose to take medicine to prevent HIV, you should first get tested for HIV. You should then be tested every 3 months  for as long as you are taking the medicine. ?Pregnancy ?If you are about to stop having your period (premenopausal) and you may become pregnant, seek counseling before you get pregnant. ?Take 400 to 800 micrograms (mcg) of folic acid every day if you become pregnant. ?Ask for birth control (contraception) if you want to prevent pregnancy. ?Osteoporosis and menopause ?Osteoporosis is a disease in which the bones lose minerals and strength with aging. This can result in bone fractures. If you are 20 years old or older, or if you are at risk for osteoporosis and fractures, ask your health care provider if you should: ?Be screened for bone loss. ?Take a calcium or vitamin D supplement to lower your risk of fractures. ?Be given hormone replacement therapy (HRT) to treat symptoms of menopause. ?Follow these instructions at home: ?Alcohol use ?Do not drink alcohol if: ?Your health care provider tells you not to drink. ?You are pregnant, may be pregnant, or are planning to become pregnant. ?If you drink alcohol: ?Limit how much you have to: ?0-1 drink a day. ?Know how much alcohol is in your drink. In the [place] one drink equals one 12 oz bottle of beer (355 mL), one 5 oz glass of wine (148 mL), or one 1? oz glass of hard liquor (44 mL). ?Lifestyle ?Do not use any products that contain nicotine or tobacco. These products include cigarettes, chewing tobacco, and vaping devices, such as e-cigarettes. If you need help quitting, ask your health care provider. ?Do not use street drugs. ?Do not share needles. ?Ask your health care provider for help if you need support or information about quitting drugs. ?General instructions ?Schedule regular health, dental, and eye exams. ?Stay current with your vaccines. ?Tell your health care provider if: ?You often feel depressed. ?You have ever been abused or do not feel safe at home. ?Summary ?Adopting a healthy lifestyle and getting preventive care are important in promoting health and  wellness. ?Follow your health care provider's instructions about healthy diet, exercising, and getting tested or screened for diseases. ?Follow your health care provider's instructions on monitoring your cholesterol and blood pressure. ?This information is not intended to replace advice given to you by your health care provider. Make sure you discuss any questions you have with your health care provider. ?Document Revised: 02/11/2021 Document Reviewed: 02/11/2021 ?Elsevier Patient Education ? [employer]. ? ?

## 2022-01-22 ENCOUNTER — Other Ambulatory Visit (HOSPITAL_COMMUNITY): Payer: Self-pay

## 2022-01-22 ENCOUNTER — Ambulatory Visit (INDEPENDENT_AMBULATORY_CARE_PROVIDER_SITE_OTHER): Payer: 59 | Admitting: Internal Medicine

## 2022-01-22 VITALS — BP 120/74 | HR 78 | Temp 98.2°F | Ht 65.0 in | Wt 134.8 lb

## 2022-01-22 DIAGNOSIS — Z13220 Encounter for screening for lipoid disorders: Secondary | ICD-10-CM

## 2022-01-22 DIAGNOSIS — Z0000 Encounter for general adult medical examination without abnormal findings: Secondary | ICD-10-CM

## 2022-01-22 DIAGNOSIS — J4520 Mild intermittent asthma, uncomplicated: Secondary | ICD-10-CM

## 2022-01-22 DIAGNOSIS — G43009 Migraine without aura, not intractable, without status migrainosus: Secondary | ICD-10-CM

## 2022-01-22 DIAGNOSIS — E010 Iodine-deficiency related diffuse (endemic) goiter: Secondary | ICD-10-CM

## 2022-01-22 DIAGNOSIS — I10 Essential (primary) hypertension: Secondary | ICD-10-CM | POA: Diagnosis not present

## 2022-01-22 LAB — COMPREHENSIVE METABOLIC PANEL WITH GFR
ALT: 9 U/L (ref 0–35)
AST: 14 U/L (ref 0–37)
Albumin: 4.2 g/dL (ref 3.5–5.2)
Alkaline Phosphatase: 68 U/L (ref 39–117)
BUN: 17 mg/dL (ref 6–23)
CO2: 29 meq/L (ref 19–32)
Calcium: 9.2 mg/dL (ref 8.4–10.5)
Chloride: 100 meq/L (ref 96–112)
Creatinine, Ser: 0.66 mg/dL (ref 0.40–1.20)
GFR: 111.5 mL/min
Glucose, Bld: 92 mg/dL (ref 70–99)
Potassium: 4.1 meq/L (ref 3.5–5.1)
Sodium: 138 meq/L (ref 135–145)
Total Bilirubin: 0.7 mg/dL (ref 0.2–1.2)
Total Protein: 7.7 g/dL (ref 6.0–8.3)

## 2022-01-22 LAB — CBC WITH DIFFERENTIAL/PLATELET
Basophils Absolute: 0 10*3/uL (ref 0.0–0.1)
Basophils Relative: 0.4 % (ref 0.0–3.0)
Eosinophils Absolute: 0.3 10*3/uL (ref 0.0–0.7)
Eosinophils Relative: 5.4 % — ABNORMAL HIGH (ref 0.0–5.0)
HCT: 37.7 % (ref 36.0–46.0)
Hemoglobin: 12.2 g/dL (ref 12.0–15.0)
Lymphocytes Relative: 24 % (ref 12.0–46.0)
Lymphs Abs: 1.3 10*3/uL (ref 0.7–4.0)
MCHC: 32.5 g/dL (ref 30.0–36.0)
MCV: 76.1 fl — ABNORMAL LOW (ref 78.0–100.0)
Monocytes Absolute: 0.5 10*3/uL (ref 0.1–1.0)
Monocytes Relative: 9 % (ref 3.0–12.0)
Neutro Abs: 3.3 10*3/uL (ref 1.4–7.7)
Neutrophils Relative %: 61.2 % (ref 43.0–77.0)
Platelets: 345 10*3/uL (ref 150.0–400.0)
RBC: 4.95 Mil/uL (ref 3.87–5.11)
RDW: 14 % (ref 11.5–15.5)
WBC: 5.4 10*3/uL (ref 4.0–10.5)

## 2022-01-22 LAB — LIPID PANEL
Cholesterol: 152 mg/dL (ref 0–200)
HDL: 74.4 mg/dL
LDL Cholesterol: 54 mg/dL (ref 0–99)
NonHDL: 77.51
Total CHOL/HDL Ratio: 2
Triglycerides: 119 mg/dL (ref 0.0–149.0)
VLDL: 23.8 mg/dL (ref 0.0–40.0)

## 2022-01-22 LAB — TSH: TSH: 1.03 u[IU]/mL (ref 0.35–5.50)

## 2022-01-22 MED ORDER — PROPRANOLOL HCL 80 MG PO TABS
80.0000 mg | ORAL_TABLET | Freq: Two times a day (BID) | ORAL | 3 refills | Status: DC
  Filled 2022-01-22: qty 180, 90d supply, fill #0
  Filled 2022-02-09: qty 60, 30d supply, fill #0
  Filled 2022-03-19: qty 60, 30d supply, fill #1
  Filled 2022-04-21: qty 60, 30d supply, fill #2
  Filled 2022-06-04: qty 60, 30d supply, fill #3
  Filled 2022-07-09: qty 60, 30d supply, fill #4
  Filled 2022-07-09: qty 49, 24d supply, fill #4
  Filled 2022-07-09: qty 11, 6d supply, fill #4
  Filled 2022-08-12: qty 60, 30d supply, fill #5
  Filled 2022-09-15: qty 60, 30d supply, fill #6
  Filled 2022-10-22: qty 60, 30d supply, fill #7
  Filled 2022-11-27: qty 60, 30d supply, fill #8
  Filled 2022-12-30: qty 60, 30d supply, fill #9

## 2022-01-22 NOTE — Assessment & Plan Note (Signed)
Chronic ?Blood pressure well controlled ?CMP ?Continue propranolol 80 mg twice daily ?

## 2022-01-22 NOTE — Assessment & Plan Note (Signed)
Chronic ?Mild, intermittent ?Albuterol inhaler as needed - rarely needed ? ?

## 2022-01-22 NOTE — Assessment & Plan Note (Signed)
Chronic ?Management per neurology and Dr. Jaynee Eagles ?

## 2022-01-22 NOTE — Assessment & Plan Note (Signed)
Thyroid feels slightly enlarged on exam ?Ultrasound 2018 showed normal size-upper limit of normal ?Check TSH ?

## 2022-01-30 ENCOUNTER — Other Ambulatory Visit (HOSPITAL_COMMUNITY): Payer: Self-pay

## 2022-02-05 ENCOUNTER — Other Ambulatory Visit (HOSPITAL_COMMUNITY): Payer: Self-pay

## 2022-02-10 ENCOUNTER — Other Ambulatory Visit (HOSPITAL_COMMUNITY): Payer: Self-pay

## 2022-02-11 ENCOUNTER — Other Ambulatory Visit (HOSPITAL_COMMUNITY): Payer: Self-pay

## 2022-02-24 ENCOUNTER — Other Ambulatory Visit (HOSPITAL_COMMUNITY): Payer: Self-pay

## 2022-03-05 ENCOUNTER — Other Ambulatory Visit (HOSPITAL_COMMUNITY): Payer: Self-pay

## 2022-03-19 ENCOUNTER — Other Ambulatory Visit: Payer: Self-pay | Admitting: Neurology

## 2022-03-19 DIAGNOSIS — G43709 Chronic migraine without aura, not intractable, without status migrainosus: Secondary | ICD-10-CM

## 2022-03-20 ENCOUNTER — Other Ambulatory Visit (HOSPITAL_COMMUNITY): Payer: Self-pay

## 2022-03-25 ENCOUNTER — Telehealth: Payer: Self-pay | Admitting: Adult Health

## 2022-03-25 ENCOUNTER — Other Ambulatory Visit (HOSPITAL_COMMUNITY): Payer: Self-pay

## 2022-03-25 DIAGNOSIS — G43709 Chronic migraine without aura, not intractable, without status migrainosus: Secondary | ICD-10-CM

## 2022-03-25 MED ORDER — EMGALITY 120 MG/ML ~~LOC~~ SOAJ
120.0000 mg | SUBCUTANEOUS | 2 refills | Status: DC
  Filled 2022-03-25: qty 1, 30d supply, fill #0
  Filled 2022-04-21 – 2022-04-25 (×2): qty 1, 30d supply, fill #1

## 2022-03-25 NOTE — Telephone Encounter (Signed)
At 11:48 pt left  vm asking for a call to make an appointment and to request a refill.  Phone rep called pt, pt stated she only wants an appointment with Dr Jaynee Eagles, gave pt her next available along with wait list.  Pt is asking for refills on her Galcanezumab-gnlm (EMGALITY) 120 MG/ML SOA to [place]

## 2022-03-25 NOTE — Telephone Encounter (Signed)
Sent in refill till August appt.

## 2022-03-27 ENCOUNTER — Other Ambulatory Visit (HOSPITAL_COMMUNITY): Payer: Self-pay

## 2022-04-10 ENCOUNTER — Other Ambulatory Visit (HOSPITAL_COMMUNITY): Payer: Self-pay

## 2022-04-21 ENCOUNTER — Other Ambulatory Visit (HOSPITAL_COMMUNITY): Payer: Self-pay

## 2022-04-22 ENCOUNTER — Other Ambulatory Visit (HOSPITAL_COMMUNITY): Payer: Self-pay

## 2022-04-22 MED ORDER — TWIRLA 120-30 MCG/24HR TD PTWK
1.0000 | MEDICATED_PATCH | TRANSDERMAL | 3 refills | Status: DC
  Filled 2022-04-22: qty 3, 21d supply, fill #0

## 2022-04-24 ENCOUNTER — Telehealth: Payer: Self-pay

## 2022-04-24 NOTE — Telephone Encounter (Signed)
Pa for emgality has been sent. (Key: [id number])  Your information has been sent to [facility].

## 2022-04-24 NOTE — Telephone Encounter (Addendum)
PA for Emgality has ben approved  This request has received a Favorable outcome.  Please note any additional information provided by [facility] at the bottom of your screen. Request Reference Number: [id number]. EMGALITY INJ '120MG'$ /ML is approved through 04/25/2023. Your patient may now fill this prescription and it will be covered.

## 2022-04-25 ENCOUNTER — Other Ambulatory Visit (HOSPITAL_COMMUNITY): Payer: Self-pay

## 2022-04-28 ENCOUNTER — Other Ambulatory Visit (HOSPITAL_COMMUNITY): Payer: Self-pay

## 2022-04-29 ENCOUNTER — Other Ambulatory Visit (HOSPITAL_COMMUNITY): Payer: Self-pay

## 2022-04-29 MED ORDER — XULANE 150-35 MCG/24HR TD PTWK
1.0000 | MEDICATED_PATCH | TRANSDERMAL | 7 refills | Status: DC
  Filled 2022-04-29: qty 3, 21d supply, fill #0
  Filled 2022-05-22: qty 3, 21d supply, fill #1
  Filled 2022-06-20: qty 3, 21d supply, fill #2
  Filled 2022-07-09 – 2022-07-21 (×2): qty 3, 21d supply, fill #3
  Filled 2022-08-12: qty 3, 21d supply, fill #4
  Filled 2022-09-02: qty 3, 21d supply, fill #5
  Filled 2022-10-13: qty 3, 21d supply, fill #6
  Filled 2022-11-10: qty 3, 21d supply, fill #7

## 2022-05-19 ENCOUNTER — Ambulatory Visit: Payer: Self-pay | Admitting: Neurology

## 2022-05-19 ENCOUNTER — Encounter: Payer: Self-pay | Admitting: Neurology

## 2022-05-19 VITALS — BP 110/78 | HR 72 | Ht 65.0 in | Wt 138.2 lb

## 2022-05-19 DIAGNOSIS — G43009 Migraine without aura, not intractable, without status migrainosus: Secondary | ICD-10-CM

## 2022-05-19 DIAGNOSIS — G43709 Chronic migraine without aura, not intractable, without status migrainosus: Secondary | ICD-10-CM

## 2022-05-19 MED ORDER — UBRELVY 100 MG PO TABS
100.0000 mg | ORAL_TABLET | ORAL | 11 refills | Status: DC | PRN
  Filled 2022-05-19: qty 9, 30d supply, fill #0
  Filled 2022-05-20: qty 16, 30d supply, fill #0
  Filled 2022-05-22: qty 16, 8d supply, fill #0
  Filled 2022-07-21: qty 16, 8d supply, fill #1
  Filled 2022-09-26: qty 16, 8d supply, fill #2
  Filled 2023-01-06: qty 16, 8d supply, fill #3

## 2022-05-19 MED ORDER — EMGALITY 120 MG/ML ~~LOC~~ SOAJ
120.0000 mg | SUBCUTANEOUS | 11 refills | Status: DC
  Filled 2022-05-19: qty 1, 30d supply, fill #0
  Filled 2022-06-20: qty 1, 30d supply, fill #1
  Filled 2022-08-01: qty 1, 30d supply, fill #2
  Filled 2022-09-02: qty 1, 30d supply, fill #3
  Filled 2022-10-03: qty 1, 30d supply, fill #4
  Filled 2022-11-10: qty 1, 30d supply, fill #5
  Filled 2022-12-09: qty 1, 30d supply, fill #6
  Filled 2023-01-06: qty 1, 30d supply, fill #7
  Filled 2023-02-09: qty 1, 30d supply, fill #8
  Filled 2023-03-12: qty 1, 30d supply, fill #9
  Filled 2023-04-17: qty 1, 30d supply, fill #10

## 2022-05-19 MED ORDER — UBRELVY 100 MG PO TABS: 100.0000 mg | ORAL_TABLET | ORAL | 0 refills | Status: DC | PRN

## 2022-05-19 MED ORDER — NARATRIPTAN HCL 2.5 MG PO TABS
2.5000 mg | ORAL_TABLET | ORAL | 5 refills | Status: DC | PRN
  Filled 2022-05-19: qty 9, 30d supply, fill #0

## 2022-05-19 NOTE — Patient Instructions (Addendum)
Take amerge OR ubrelvy at onset of headache. During period week take Ubrelvy twice daily for 4 daily [place] today was a misytake appt   Ubrogepant Tablets What is this medication? UBROGEPANT (ue BROE je pant) treats migraines. It works by blocking a substance in the body that causes migraines. It is not used to prevent migraines. This medicine may be used for other purposes; ask your health care provider or pharmacist if you have questions. COMMON BRAND NAME(S): Roselyn Meier What should I tell my care team before I take this medication? They need to know if you have any of these conditions: Kidney disease Liver disease An unusual or allergic reaction to ubrogepant, other medications, foods, dyes, or preservatives Pregnant or trying to get pregnant Breast-feeding How should I use this medication? Take this medication by mouth with a glass of water. Take it as directed on the prescription label. You can take it with or without food. If it upsets your stomach, take it with food. Keep taking it unless your care team tells you to stop. Talk to your care team about the use of this medication in children. Special care may be needed. Overdosage: If you think you have taken too much of this medicine contact a poison control center or emergency room at once. NOTE: This medicine is only for you. Do not share this medicine with others. What if I miss a dose? This does not apply. This medication is not for regular use. What may interact with this medication? Do not take this medication with any of the following: Adagrasib Ceritinib Certain antibiotics, such as chloramphenicol, clarithromycin, telithromycin Certain antivirals for HIV, such as atazanavir, cobicistat, darunavir, delavirdine, fosamprenavir, indinavir, ritonavir Certain medications for fungal infections, such as itraconazole, ketoconazole, posaconazole, voriconazole Conivaptan Grapefruit Idelalisib Mifepristone Nefazodone Ribociclib This  medication may also interact with the following: Carvedilol Certain medications for seizures, such as phenobarbital, phenytoin Ciprofloxacin Cyclosporine Eltrombopag Fluconazole Fluvoxamine Quinidine Rifampin St. John's wort Verapamil This list may not describe all possible interactions. Give your health care provider a list of all the medicines, herbs, non-prescription drugs, or dietary supplements you use. Also tell them if you smoke, drink alcohol, or use illegal drugs. Some items may interact with your medicine. What should I watch for while using this medication? Visit your care team for regular checks on your progress. Tell your care team if your symptoms do not start to get better or if they get worse. Your mouth may get dry. Chewing sugarless gum or sucking hard candy and drinking plenty of water may help. Contact your care team if the problem does not go away or is severe. What side effects may I notice from receiving this medication? Side effects that you should report to your care team as soon as possible: Allergic reactions--skin rash, itching, hives, swelling of the face, lips, tongue, or throat Side effects that usually do not require medical attention (report to your care team if they continue or are bothersome): Drowsiness Dry mouth Fatigue Nausea This list may not describe all possible side effects. Call your doctor for medical advice about side effects. You may report side effects to FDA at 1-800-FDA-1088. Where should I keep my medication? Keep out of the reach of children and pets. Store between 15 and 30 degrees C (59 and 86 degrees F). Get rid of any unused medication after the expiration date. To get rid of medications that are no longer needed or have expired: Take the medication to a medication take-back program. Check with  your pharmacy or law enforcement to find a location. If you cannot return the medication, check the label or package insert to see if the  medication should be thrown out in the garbage or flushed down the toilet. If you are not sure, ask your care team. If it is safe to put it in the trash, pour the medication out of the container. Mix the medication with cat litter, dirt, coffee grounds, or other unwanted substance. Seal the mixture in a bag or container. Put it in the trash. NOTE: This sheet is a summary. It may not cover all possible information. If you have questions about this medicine, talk to your doctor, pharmacist, or health care provider.  2023 Elsevier/Gold Standard (2021-10-04 00:00:00)

## 2022-05-19 NOTE — Progress Notes (Signed)
[facility]    Provider:  Dr Jaynee Eagles Requesting Provider: Binnie Rail, MD Primary Care Provider:  Binnie Rail, MD  CC:  Migraines  May 19, 2022: Patient is here today but it was a mistake, she is actually doing very well,   Take amerge OR ubrelvy at onset of headache. During period week take Ubrelvy twice daily for 4 daily NO CHARGE today was a mistake appt   Interval history: Ajovy worked decrease a little bit but not working anymore, now back to daily migraines. She was not fan of the injections. The maxalt did not help. We discussed Emgality and she doesn't like the injections. Once the daily headaches came back she started taking excedrin again. We discussed oral medications, the new Qulipta and botox.   Patient complains of symptoms per HPI as well as the following symptoms: daily headaches . Pertinent negatives and positives per HPI. All others negative   HPI:  Jasmine Flores is a 36 y.o. female here as requested by Binnie Rail, MD for migraines.  I reviewed Dr. Quay Burow notes: Patient is having 16 migraines a month, worse during her menses), she usually wakes up with renal colic much, we discussed Topamax nortriptyline Aimovig, she is on Fioricet for acute management and Zofran for nausea with her migraines.  Blood pressures are well controlled.  She was continued on.  At prior appointments she also discussed migraines, reporting at least 4/week, she decreased her Excedrin Migraine because of her gastritis, she was started on propranolol in September 2022 also help with blood pressure as well as migraine prophylaxis.  They also discussed Imitrex or Zomig/Maxalt if needed.  She is here alone and reports migraines diagnosed in her 70a, she tried topamax, nortriptyline, propranolol. Stopped taking excedrin or OTC medications, her stomach getting gastritis. Migraines have been worsening since 2019, after her second child, also in the setting of  stress. Worse during the menses, the week before that is the worst. She usually wakes up with them every day, skipping meals Flores trigger, unilateral and usually start on the right and spreads, pounding/pulsating/throbbing, nausea, vomiting, photo/phonophobia, turnig off the light helps, movement makes it worse and has to be still if bending over it makes it worse and keeps getting worse. Daily headaches and more than 15 migraine days that are moderately severe to severe for > 1 year. She is  A light sleeper but no snoring and no significant fatigue during the day or dry mouth in the morning, she does have young kids and she doesn't have a night where she doesn't sleep throughout the night. She is using condoms and no pregnancy plans. No other focal neurologic deficits, associated symptoms, inciting events or modifiable factors.  Reviewed notes, labs and imaging from outside physicians, which showed:  MRi brain 2017: reviewed images, agree with the following  Postop pituitary resection. No prior studies available. Complex appearance of the sella. 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 The pituitary appears to be up lifted. There is low signal material in the anterior sella which may be graft material from prior trans-sphenoidal resection. Less likely but possible is flow void related to aneurysm or vascular abnormality or gas. For this reason, CTA head is recommended for further evaluation to rule out aneurysm and evaluate the wall of the sphenoid sinus.   Reviewed CTA report only, as below:  IMPRESSION: 1. The unusual low T1  signal area along the anterior sella turcica described on the recent MRI corresponds to a 12 mm mildly lobulated focus of extremely hyperdense material which I favor is either operative glue or cement. This does abut the right ICA siphon, but there is no associated aneurysm or ICA stenosis. 2.  Otherwise negative intracranial CTA, and normal CT appearance of the brain. TSH, CMP normal 01/13/2020  Review of Systems: Patient complains of symptoms per HPI as well as the following symptoms: headache, fatigue Pertinent negatives and positives per HPI. All others negative.   Social History   Socioeconomic History   Marital status: Single    Spouse name: Not on file   Number of children: 2   Years of education: Not on file   Highest education level: Master's degree (e.g., MA, MS, MEng, MEd, MSW, MBA)  Occupational History   Not on file  Tobacco Use   Smoking status: Never   Smokeless tobacco: Never  Vaping Use   Vaping Use: Never used  Substance and Sexual Activity   Alcohol use: No   Drug use: No   Sexual activity: Yes    Birth control/protection: None  Other Topics Concern   Not on file  Social History Narrative   Works for [employer] at home with her fiance and her children   Right handed   Caffeine: has one Flores of soda/day   Social Determinants of Health   Financial Resource Strain: Low Risk  (06/23/2018)   Overall Financial Resource Strain (CARDIA)    Difficulty of Paying Living Expenses: Not hard at all  Food Insecurity: No Food Insecurity (06/23/2018)   Hunger Vital Sign    Worried About Running Out of Food in the Last Year: Never true    [place] in the Last Year: Never true  Transportation Needs: Unknown (06/23/2018)   PRAPARE - Hydrologist (Medical): No    Lack of Transportation (Non-Medical): Not on file  Physical Activity: Inactive (06/23/2018)   Exercise Vital Sign    Days of Exercise per Week: 0 days    Minutes of Exercise per Session: 0 min  Stress: Stress Concern Present (06/23/2018)   [place]    Feeling of Stress : To some extent  Social Connections: Not on file  Intimate Partner Violence: Not At Risk (06/23/2018)   Humiliation,  Afraid, Rape, and Kick questionnaire    Fear of Current or Ex-Partner: No    Emotionally Abused: No    Physically Abused: No    Sexually Abused: No    Family History  Problem Relation Age of Onset   Diabetes Mother    Hypertension Mother    Diabetes Father    Hypertension Father    Stroke Father    Hypertension Maternal Grandmother    Diabetes Maternal Grandmother    Hypertension Maternal Grandfather    Diabetes Maternal Grandfather    Colon cancer Neg Hx    Esophageal cancer Neg Hx    Inflammatory bowel disease Neg Hx    Liver disease Neg Hx    Pancreatic cancer Neg Hx    Stomach cancer Neg Hx    Rectal cancer Neg Hx    Migraines Neg Hx     Past Medical History:  Diagnosis Date   Alpha trait thalassemia    Anemia    Asthma    Headache    HSV infection    Migraines  Positive TB test    Pregnancy induced hypertension    Vaginal Pap smear, abnormal     Patient Active Problem List   Diagnosis Date Noted   Migraine without aura and without status migrainosus, not intractable 05/19/2022   History of peptic ulcer disease 05/10/2020   Gastritis without bleeding 08/26/2019   Hypertension 06/21/2019   Gastroesophageal reflux disease 04/19/2019   Chronic constipation 03/26/2019   History of gestational hypertension 07/01/2018   Palpitations 06/22/2017   Thyromegaly 06/22/2017   Lung nodules, stable no f/u needed 12/28/2016   Pituitary adenoma ([place]) 09/08/2016   Asthma 09/03/2015   Migraine headache 09/03/2015    Past Surgical History:  Procedure Laterality Date   PITUITARY SURGERY  2008   for adenoma   TONSILECTOMY, ADENOIDECTOMY, BILATERAL MYRINGOTOMY AND TUBES     TONSILLECTOMY     and adenoids   WISDOM TOOTH EXTRACTION      Current Outpatient Medications  Medication Sig Dispense Refill   albuterol (VENTOLIN HFA) 108 (90 Base) MCG/ACT inhaler Inhale 2 puffs into the lungs every 4 (four) hours as needed.     ELDERBERRY PO Take by mouth.     Multiple  Vitamin (MULTIVITAMIN PO) Take by mouth.     norelgestromin-ethinyl estradiol Marilu Favre) 150-35 MCG/24HR transdermal patch Place 1 patch onto the skin once a week for 21 days. 3 patch 7   ondansetron (ZOFRAN-ODT) 4 MG disintegrating tablet Take 1 tablet (4 mg total) by mouth every 8 (eight) hours as needed (for nausea associated with migraines). 30 tablet 3   PRESCRIPTION MEDICATION Take 60 mg by mouth daily. QULIPTA     propranolol (INDERAL) 80 MG tablet Take 1 tablet (80 mg total) by mouth 2 (two) times daily. 180 tablet 3   Galcanezumab-gnlm (EMGALITY) 120 MG/ML SOAJ Inject 120 mg into the skin every 30 (thirty) days. 1 mL 11   Levonorgestrel-Eth Estradiol (TWIRLA) 120-30 MCG/24HR PTWK Place 1 patch onto the skin once a week as directed for 21 days 3 patch 3   Levonorgestrel-Eth Estradiol (TWIRLA) 120-30 MCG/24HR PTWK Apply 1 patch onto skin once weekly as directed for 21 days 3 patch 3   naratriptan (AMERGE) 2.5 MG tablet TAKE 1 TABLET BY MOUTH AT ONSET OF HEADACHE/MIGRAINE. IF RETURNS OR DOES NOT RESOLVE, MAY REPEAT AFTER 2 HOURS. DO NOT EXCEED '5MG'$  IN 24 HOURS 9 tablet 5   Ubrogepant (UBRELVY) 100 MG TABS Take 100 mg by mouth every 2 (two) hours as needed. Maximum '200mg'$  a day. 16 tablet 11   No current facility-administered medications for this visit.    Allergies as of 05/19/2022 - Review Complete 05/19/2022  Allergen Reaction Noted   Shellfish allergy Itching, Swelling, Shortness Of Breath, and Anaphylaxis 11/13/2014   Sulfa antibiotics Itching and Swelling 11/13/2014    Vitals: BP 110/78   Pulse 72   Ht '5\' 5"'$  (1.651 m)   Wt 138 lb 3.2 oz (62.7 kg)   BMI 23.00 kg/m  Last Weight:  Wt Readings from Last 1 Encounters:  05/19/22 138 lb 3.2 oz (62.7 kg)   Last Height:   Ht Readings from Last 1 Encounters:  05/19/22 '5\' 5"'$  (1.651 m)   Exam: NAD, pleasant                  Speech:    Speech is normal; fluent and spontaneous with normal comprehension.  Cognition:    The patient is  oriented to person, place, and time;     recent and remote memory intact;  language fluent;    Cranial Nerves:    The pupils are equal, round, and reactive to light.Trigeminal sensation is intact and the muscles of mastication are normal. The face is symmetric. The palate elevates in the midline. Hearing intact. Voice is normal. Shoulder shrug is normal. The tongue has normal motion without fasciculations.   Coordination:  No dysmetria  Motor Observation:    No asymmetry, no atrophy, and no involuntary movements noted. Tone:    Normal muscle tone.     Strength:    Strength is V/V in the upper and lower limbs.      Sensation: intact to LT       Assessment/Plan:  Chronic daily headaches due to medication overuse and migraines. No more medication overuse. Doing well was returned to pcp,  Take amerge OR ubrelvy at onset of headache. During period week take Ubrelvy twice daily for 4 days daily NO CHARGE today was a mistake appt, stacy burns managing medications we Flores refill all today however since she waited an extended amount of time for me Continue emgality, refilled  Meds ordered this encounter  Medications   DISCONTD: Ubrogepant (UBRELVY) 100 MG TABS    Sig: Take 100 mg by mouth every 2 (two) hours as needed. Maximum '200mg'$  a day.    Dispense:  4 tablet    Refill:  0   Galcanezumab-gnlm (EMGALITY) 120 MG/ML SOAJ    Sig: Inject 120 mg into the skin every 30 (thirty) days.    Dispense:  1 mL    Refill:  11   naratriptan (AMERGE) 2.5 MG tablet    Sig: TAKE 1 TABLET BY MOUTH AT ONSET OF HEADACHE/MIGRAINE. IF RETURNS OR DOES NOT RESOLVE, MAY REPEAT AFTER 2 HOURS. DO NOT EXCEED '5MG'$  IN 24 HOURS    Dispense:  9 tablet    Refill:  5   Ubrogepant (UBRELVY) 100 MG TABS    Sig: Take 100 mg by mouth every 2 (two) hours as needed. Maximum '200mg'$  a day.    Dispense:  16 tablet    Refill:  11    Failed imitrex, maxalt. 5 migaine days a month and < 10 total headache days a month    08/08/2020:   Ajovy worked and decrease a little(to 10 migraine days a month) but not working anymore, now back to daily migraines.Discussed Emgality and She was not fan of the injections. The maxalt did not help. We discussed Emgality and she doesn't like the injections. Once the daily headaches came back she started taking excedrin again, she is aware of medication rebound. Maxalt did not work she states. We discussed oral medications, the new Qulipta and botox.   MRI brain stable  Limit Fioricet and excedrin to < 5 days a month, discussed medication rebound and oversue Try Amerge for acute management She has tried and failed nortriptyline, topamax, propranolol, Ajovy, imitrex, maxalt, and doesn't want another injection we Flores try Qulipta for preventative and naratriptan acutely Printed drug information from UpToDate, also checked in [employer] and no significant interactions with Qulipta, zofran, amerge or propranolol Gave her 24 tablets samples and will send in prescription Recommended botox  Discussed: To prevent or relieve headaches, try the following: Cool Compress. Lie down and place a cool compress on your head.  Avoid headache triggers. If certain foods or odors seem to have triggered your migraines in the past, avoid them. A headache diary might help you identify triggers.  Include physical activity in your daily routine. Try a daily  walk or other moderate aerobic exercise.  Manage stress. Find healthy ways to cope with the stressors, such as delegating tasks on your to-do list.  Practice relaxation techniques. Try deep breathing, yoga, massage and visualization.  Eat regularly. Eating regularly scheduled meals and maintaining a healthy diet might help prevent headaches. Also, drink plenty of fluids.  Follow a regular sleep schedule. Sleep deprivation might contribute to headaches Consider biofeedback. With this mind-body technique, you learn to control certain bodily functions --  such as muscle tension, heart rate and blood pressure -- to prevent headaches or reduce headache pain.    Proceed to emergency room if you experience new or worsening symptoms or symptoms do not resolve, if you have new neurologic symptoms or if headache is severe, or for any concerning symptom.   Provided education and documentation from American headache Society toolbox including articles on: chronic migraine medication overuse headache, chronic migraines, prevention of migraines, behavioral and other nonpharmacologic treatments for headache.  Cc: Binnie Rail, MD,    Sarina Ill, MD  [facility] [address] [place], [place] [address]  Phone [phone] Fax [phone]  I spent over 30  minutes of face-to-face and non-face-to-face time with patient on the  1. Migraine without aura and without status migrainosus, not intractable   2. Chronic migraine without aura without status migrainosus, not intractable     diagnosis.  This included previsit chart review, lab review, study review, order entry, electronic health record documentation, patient education on the different diagnostic and therapeutic options, counseling and coordination of care, risks and benefits of management, compliance, or risk factor reduction

## 2022-05-20 ENCOUNTER — Other Ambulatory Visit (HOSPITAL_COMMUNITY): Payer: Self-pay

## 2022-05-21 ENCOUNTER — Encounter: Payer: Self-pay | Admitting: *Deleted

## 2022-05-21 NOTE — Telephone Encounter (Signed)
Completed Roselyn Meier PA on Cover My Meds. Key: [id number]. Awaiting determination from [facility].

## 2022-05-22 NOTE — Telephone Encounter (Signed)
Approved on August 16 Request Reference Number: [id number]. UBRELVY TAB '100MG'$  is approved through 05/22/2023. Your patient may now fill this prescription and it will be covered.

## 2022-05-23 ENCOUNTER — Other Ambulatory Visit (HOSPITAL_COMMUNITY): Payer: Self-pay

## 2022-06-04 ENCOUNTER — Other Ambulatory Visit (HOSPITAL_COMMUNITY): Payer: Self-pay

## 2022-06-20 ENCOUNTER — Other Ambulatory Visit (HOSPITAL_COMMUNITY): Payer: Self-pay

## 2022-07-09 ENCOUNTER — Other Ambulatory Visit (HOSPITAL_COMMUNITY): Payer: Self-pay

## 2022-07-18 ENCOUNTER — Other Ambulatory Visit (HOSPITAL_COMMUNITY): Payer: Self-pay

## 2022-07-21 ENCOUNTER — Other Ambulatory Visit (HOSPITAL_COMMUNITY): Payer: Self-pay

## 2022-07-22 ENCOUNTER — Other Ambulatory Visit (HOSPITAL_COMMUNITY): Payer: Self-pay

## 2022-07-23 ENCOUNTER — Ambulatory Visit: Payer: 59

## 2022-07-23 ENCOUNTER — Ambulatory Visit: Payer: 59 | Admitting: Internal Medicine

## 2022-08-01 ENCOUNTER — Other Ambulatory Visit (HOSPITAL_COMMUNITY): Payer: Self-pay

## 2022-08-01 ENCOUNTER — Ambulatory Visit (INDEPENDENT_AMBULATORY_CARE_PROVIDER_SITE_OTHER): Payer: 59

## 2022-08-01 DIAGNOSIS — Z23 Encounter for immunization: Secondary | ICD-10-CM | POA: Diagnosis not present

## 2022-08-13 ENCOUNTER — Other Ambulatory Visit (HOSPITAL_COMMUNITY): Payer: Self-pay

## 2022-08-15 ENCOUNTER — Other Ambulatory Visit (HOSPITAL_COMMUNITY): Payer: Self-pay

## 2022-09-02 ENCOUNTER — Other Ambulatory Visit (HOSPITAL_COMMUNITY): Payer: Self-pay

## 2022-10-22 ENCOUNTER — Other Ambulatory Visit: Payer: Self-pay

## 2022-11-10 ENCOUNTER — Other Ambulatory Visit (HOSPITAL_COMMUNITY): Payer: Self-pay

## 2022-11-11 ENCOUNTER — Other Ambulatory Visit (HOSPITAL_COMMUNITY): Payer: Self-pay

## 2022-11-27 ENCOUNTER — Other Ambulatory Visit: Payer: Self-pay

## 2022-11-27 ENCOUNTER — Other Ambulatory Visit (HOSPITAL_COMMUNITY): Payer: Self-pay

## 2022-11-28 ENCOUNTER — Other Ambulatory Visit (HOSPITAL_COMMUNITY): Payer: Self-pay

## 2022-11-28 MED ORDER — NORELGESTROMIN-ETH ESTRADIOL 150-35 MCG/24HR TD PTWK
1.0000 | MEDICATED_PATCH | TRANSDERMAL | 7 refills | Status: DC
Start: 2022-11-27 — End: 2024-02-11
  Filled 2022-11-28: qty 3, 21d supply, fill #0
  Filled 2023-01-06: qty 3, 21d supply, fill #1
  Filled 2023-01-22: qty 3, 21d supply, fill #2
  Filled 2023-03-04: qty 3, 21d supply, fill #3
  Filled 2023-04-02: qty 3, 21d supply, fill #4

## 2022-12-09 ENCOUNTER — Other Ambulatory Visit (HOSPITAL_COMMUNITY): Payer: Self-pay

## 2022-12-16 ENCOUNTER — Encounter: Payer: Self-pay | Admitting: Internal Medicine

## 2022-12-16 NOTE — Progress Notes (Unsigned)
    Subjective:    Patient ID: Jasmine Flores, female    DOB: 02-23-1986, 37 y.o.   MRN: [id number]      HPI Jasmine Flores is here for No chief complaint on file.     Left knee pain x 10 days -    Medications and allergies reviewed with patient and updated if appropriate.  Current Outpatient Medications on File Prior to Visit  Medication Sig Dispense Refill   albuterol (VENTOLIN HFA) 108 (90 Base) MCG/ACT inhaler Inhale 2 puffs into the lungs every 4 (four) hours as needed.     ELDERBERRY PO Take by mouth.     Galcanezumab-gnlm (EMGALITY) 120 MG/ML SOAJ Inject 120 mg into the skin every 30 (thirty) days. 1 mL 11   Levonorgestrel-Eth Estradiol (TWIRLA) 120-30 MCG/24HR PTWK Place 1 patch onto the skin once a week as directed for 21 days 3 patch 3   Levonorgestrel-Eth Estradiol (TWIRLA) 120-30 MCG/24HR PTWK Apply 1 patch onto skin once weekly as directed for 21 days 3 patch 3   Multiple Vitamin (MULTIVITAMIN PO) Take by mouth.     naratriptan (AMERGE) 2.5 MG tablet TAKE 1 TABLET BY MOUTH AT ONSET OF HEADACHE/MIGRAINE. IF RETURNS OR DOES NOT RESOLVE, MAY REPEAT AFTER 2 HOURS. DO NOT EXCEED 5MG  IN 24 HOURS 9 tablet 5   norelgestromin-ethinyl estradiol (ZAFEMY) 150-35 MCG/24HR transdermal patch Place 1 patch onto the skin once a week. 3 patch 7   ondansetron (ZOFRAN-ODT) 4 MG disintegrating tablet Take 1 tablet (4 mg total) by mouth every 8 (eight) hours as needed (for nausea associated with migraines). 30 tablet 3   PRESCRIPTION MEDICATION Take 60 mg by mouth daily. QULIPTA     propranolol (INDERAL) 80 MG tablet Take 1 tablet (80 mg total) by mouth 2 (two) times daily. 180 tablet 3   Ubrogepant (UBRELVY) 100 MG TABS Take 1 tablet (100 mg) by mouth every 2 (two) hours as needed. Maximum 2 tablets (200mg ) a day. 16 tablet 11   No current facility-administered medications on file prior to visit.    Review of Systems     Objective:  There were no vitals filed for this  visit. BP Readings from Last 3 Encounters:  05/19/22 110/78  01/22/22 120/74  01/17/21 116/72   Wt Readings from Last 3 Encounters:  05/19/22 138 lb 3.2 oz (62.7 kg)  01/22/22 134 lb 12.8 oz (61.1 kg)  01/17/21 135 lb (61.2 kg)   There is no height or weight on file to calculate BMI.    Physical Exam         Assessment & Plan:    See Problem List for Assessment and Plan of chronic medical problems.

## 2022-12-17 ENCOUNTER — Ambulatory Visit: Payer: 59 | Admitting: Internal Medicine

## 2022-12-17 ENCOUNTER — Other Ambulatory Visit (HOSPITAL_COMMUNITY): Payer: Self-pay

## 2022-12-17 VITALS — BP 116/80 | HR 70 | Temp 98.6°F | Ht 65.0 in | Wt 140.0 lb

## 2022-12-17 DIAGNOSIS — M25562 Pain in left knee: Secondary | ICD-10-CM | POA: Diagnosis not present

## 2022-12-17 DIAGNOSIS — A6000 Herpesviral infection of urogenital system, unspecified: Secondary | ICD-10-CM | POA: Diagnosis not present

## 2022-12-17 MED ORDER — MELOXICAM 15 MG PO TABS
15.0000 mg | ORAL_TABLET | Freq: Every day | ORAL | 0 refills | Status: DC
  Filled 2022-12-17: qty 14, 14d supply, fill #0

## 2022-12-17 MED ORDER — VALACYCLOVIR HCL 500 MG PO TABS
500.0000 mg | ORAL_TABLET | Freq: Two times a day (BID) | ORAL | 2 refills | Status: DC | PRN
  Filled 2022-12-17: qty 30, 15d supply, fill #0
  Filled 2023-02-04: qty 30, 15d supply, fill #1
  Filled 2023-06-01: qty 30, 15d supply, fill #2

## 2022-12-17 NOTE — Assessment & Plan Note (Signed)
Acute on chronic States chronic issues with her knees that she has not had evaluated When she was younger she felt like she may have dislocated her patella-this was never evaluated at that time Current flare of pain that is not resolving Does have some swelling/effusion on exam Tenderness surrounding patella Start meloxicam 15 mg daily x 2 weeks-advised to take with food Referral to sports medicine

## 2022-12-17 NOTE — Assessment & Plan Note (Signed)
Acute flare Start Valtrex 500 mg every 12 hours x 3 days Use as needed-gets rare flares

## 2022-12-17 NOTE — Patient Instructions (Addendum)
       Medications changes include :   meloxicam 15 mg daily x 2 weeks, valtrex twice daily for 3 days    A referral was ordered for sports medicine.     Someone will call you to schedule an appointment.     Return if symptoms worsen or fail to improve.

## 2022-12-30 ENCOUNTER — Other Ambulatory Visit (HOSPITAL_COMMUNITY): Payer: Self-pay

## 2022-12-30 ENCOUNTER — Other Ambulatory Visit: Payer: Self-pay

## 2023-01-14 ENCOUNTER — Other Ambulatory Visit (HOSPITAL_COMMUNITY): Payer: Self-pay

## 2023-01-19 ENCOUNTER — Encounter: Payer: Self-pay | Admitting: Neurology

## 2023-01-19 ENCOUNTER — Other Ambulatory Visit (HOSPITAL_COMMUNITY): Payer: Self-pay

## 2023-01-19 ENCOUNTER — Telehealth (INDEPENDENT_AMBULATORY_CARE_PROVIDER_SITE_OTHER): Payer: 59 | Admitting: Neurology

## 2023-01-19 ENCOUNTER — Telehealth: Payer: Self-pay | Admitting: Neurology

## 2023-01-19 DIAGNOSIS — G43709 Chronic migraine without aura, not intractable, without status migrainosus: Secondary | ICD-10-CM | POA: Diagnosis not present

## 2023-01-19 MED ORDER — NURTEC 75 MG PO TBDP
75.0000 mg | ORAL_TABLET | Freq: Every day | ORAL | 11 refills | Status: DC | PRN
  Filled 2023-01-19: qty 16, 16d supply, fill #0
  Filled 2023-06-01: qty 16, 30d supply, fill #1
  Filled 2023-07-10: qty 8, 30d supply, fill #1
  Filled 2023-07-12: qty 16, 16d supply, fill #1
  Filled 2023-07-27: qty 8, 30d supply, fill #1
  Filled 2023-10-20: qty 8, 30d supply, fill #2
  Filled 2023-12-02: qty 8, 30d supply, fill #3
  Filled 2024-01-04: qty 8, 30d supply, fill #4

## 2023-01-19 NOTE — Telephone Encounter (Signed)
Scheduled with Megan for VV on 01/19/24 at 1:45 pm.

## 2023-01-19 NOTE — Telephone Encounter (Signed)
Can you schedule her for follow up in one year with megan NP she can do a virtual med refill thanks

## 2023-01-19 NOTE — Progress Notes (Signed)
[facility]    Provider:  Dr Lucia Gaskins Requesting Provider: Pincus Sanes, MD Primary Care Provider:  Pincus Sanes, MD   Virtual Visit via Video Note  I connected with [name] on 01/19/23 at  1:30 PM EDT by a video enabled telemedicine application and verified that I am speaking with the correct person using two identifiers.  Location: Patient: home Provider: office   I discussed the limitations of evaluation and management by telemedicine and the availability of in person appointments. The patient expressed understanding and agreed to proceed.   Follow Up Instructions:    I discussed the assessment and treatment plan with the patient. The patient was provided an opportunity to ask questions and all were answered. The patient agreed with the plan and demonstrated an understanding of the instructions.   The patient was advised to call back or seek an in-person evaluation if the symptoms worsen or if the condition fails to improve as anticipated.  I provided 20 minutes of non-face-to-face time during this encounter.   Anson Fret, MD  CC:  Migraines  01/19/2023: had daily chronic migraines started on emglaity and doing great. On emglaity, ubrelvy, naratriptan, propranolol. She is doing well. We just refilled. Most migraines nows around menstrual cycles, taking Bernita Raisin helps when they start twice a day and for the morst part keep them away but not great. 4 migraines a month, ubrelvy not really helping as well, < 6 total headache days a month, try nurtec instead of the ubrelvy. Discussed options. Take nurtec daily during period and as needed and see if it helps more than ubrelvy once daily. Could also try amerg with the ubrelvy still not a total winner. No other focal neurologic deficits, associated symptoms, inciting events or modifiable factors. Discussed options. At this time no need for mri, had pituitary removal but no symptoms. She had  pituitary adenoma in 2008 and removed.no vision changes, headaches are pretty much resolved, sees eye doctor, 2017 mri was stable, repeat for any changes or vision problems   Patient complains of symptoms per HPI as well as the following symptoms: none . Pertinent negatives and positives per HPI. All others negative   May 19, 2022: Patient is here today but it was a mistake, she is actually doing very well,   Take amerge OR ubrelvy at onset of headache. During period week take Ubrelvy twice daily for 4 daily NO CHARGE today was a mistake appt   Interval history: Ajovy worked decrease a little bit but not working anymore, now back to daily migraines. She was not fan of the injections. The maxalt did not help. We discussed Emgality and she doesn't like the injections. Once the daily headaches came back she started taking excedrin again. We discussed oral medications, the new Qulipta and botox.   Patient complains of symptoms per HPI as well as the following symptoms: daily headaches . Pertinent negatives and positives per HPI. All others negative   HPI:  Jasmine Flores is a 37 y.o. female here as requested by Pincus Sanes, MD for migraines.  I reviewed Dr. Lawerance Bach notes: Patient is having 16 migraines a month, worse during her menses), she usually wakes up with renal colic much, we discussed Topamax nortriptyline Aimovig, she is on Fioricet for acute management and Zofran for nausea with her migraines.  Blood pressures are well controlled.  She was continued on.  At prior appointments she also discussed migraines, reporting at least 4/week,  she decreased her Excedrin Migraine because of her gastritis, she was started on propranolol in September 2022 also help with blood pressure as well as migraine prophylaxis.  They also discussed Imitrex or Zomig/Maxalt if needed.  She is here alone and reports migraines diagnosed in her 81a, she tried topamax, nortriptyline, propranolol.  Stopped taking excedrin or OTC medications, her stomach getting gastritis. Migraines have been worsening since 2019, after her second child, also in the setting of stress. Worse during the menses, the week before that is the worst. She usually wakes up with them every day, skipping meals can trigger, unilateral and usually start on the right and spreads, pounding/pulsating/throbbing, nausea, vomiting, photo/phonophobia, turnig off the light helps, movement makes it worse and has to be still if bending over it makes it worse and keeps getting worse. Daily headaches and more than 15 migraine days that are moderately severe to severe for > 1 year. She is  A light sleeper but no snoring and no significant fatigue during the day or dry mouth in the morning, she does have young kids and she doesn't have a night where she doesn't sleep throughout the night. She is using condoms and no pregnancy plans. No other focal neurologic deficits, associated symptoms, inciting events or modifiable factors.  Reviewed notes, labs and imaging from outside physicians, which showed:  MRi brain 2017: reviewed images, agree with the following  Postop pituitary resection. No prior studies available. Complex appearance of the sella. 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 The pituitary appears to be up lifted. There is low signal material in the anterior sella which may be graft material from prior trans-sphenoidal resection. Less likely but possible is flow void related to aneurysm or vascular abnormality or gas. For this reason, CTA head is recommended for further evaluation to rule out aneurysm and evaluate the wall of the sphenoid sinus.   Reviewed CTA report only, as below:  IMPRESSION: 1. The unusual low T1 signal area along the anterior sella turcica described on the recent MRI corresponds to a 12 mm mildly lobulated focus of extremely hyperdense  material which I favor is either operative glue or cement. This does abut the right ICA siphon, but there is no associated aneurysm or ICA stenosis. 2. Otherwise negative intracranial CTA, and normal CT appearance of the brain. TSH, CMP normal 01/13/2020  Review of Systems: Patient complains of symptoms per HPI as well as the following symptoms: headache, fatigue Pertinent negatives and positives per HPI. All others negative.   Social History   Socioeconomic History   Marital status: Single    Spouse name: Not on file   Number of children: 2   Years of education: Not on file   Highest education level: Master's degree (e.g., MA, MS, MEng, MEd, MSW, MBA)  Occupational History   Not on file  Tobacco Use   Smoking status: Never   Smokeless tobacco: Never  Vaping Use   Vaping Use: Never used  Substance and Sexual Activity   Alcohol use: No   Drug use: No   Sexual activity: Yes    Birth control/protection: None  Other Topics Concern   Not on file  Social History Narrative   Works for [employer] at home with her fiance and her children   Right handed   Caffeine: has one can of soda/day   Social Determinants of Corporate investment banker  Strain: Low Risk  (06/23/2018)   Overall Financial Resource Strain (CARDIA)    Difficulty of Paying Living Expenses: Not hard at all  Food Insecurity: No Food Insecurity (06/23/2018)   Hunger Vital Sign    Worried About Running Out of Food in the Last Year: Never true    Ran Out of Food in the Last Year: Never true  Transportation Needs: Unknown (06/23/2018)   PRAPARE - Administrator, Civil Service (Medical): No    Lack of Transportation (Non-Medical): Not on file  Physical Activity: Inactive (06/23/2018)   Exercise Vital Sign    Days of Exercise per Week: 0 days    Minutes of Exercise per Session: 0 min  Stress: Stress Concern Present (06/23/2018)   [employer] of Occupational Health - Occupational Stress  Questionnaire    Feeling of Stress : To some extent  Social Connections: Not on file  Intimate Partner Violence: Not At Risk (06/23/2018)   Humiliation, Afraid, Rape, and Kick questionnaire    Fear of Current or Ex-Partner: No    Emotionally Abused: No    Physically Abused: No    Sexually Abused: No    Family History  Problem Relation Age of Onset   Diabetes Mother    Hypertension Mother    Diabetes Father    Hypertension Father    Stroke Father    Hypertension Maternal Grandmother    Diabetes Maternal Grandmother    Hypertension Maternal Grandfather    Diabetes Maternal Grandfather    Colon cancer Neg Hx    Esophageal cancer Neg Hx    Inflammatory bowel disease Neg Hx    Liver disease Neg Hx    Pancreatic cancer Neg Hx    Stomach cancer Neg Hx    Rectal cancer Neg Hx    Migraines Neg Hx     Past Medical History:  Diagnosis Date   Alpha trait thalassemia    Anemia    Asthma    Headache    HSV infection    Migraines    Positive TB test    Pregnancy induced hypertension    Vaginal Pap smear, abnormal     Patient Active Problem List   Diagnosis Date Noted   Left knee pain 12/17/2022   Genital herpes 12/17/2022   Migraine without aura and without status migrainosus, not intractable 05/19/2022   History of peptic ulcer disease 05/10/2020   Gastritis without bleeding 08/26/2019   Hypertension 06/21/2019   Gastroesophageal reflux disease 04/19/2019   Chronic constipation 03/26/2019   History of gestational hypertension 07/01/2018   Palpitations 06/22/2017   Thyromegaly 06/22/2017   Lung nodules, stable no f/u needed 12/28/2016   Pituitary adenoma 09/08/2016   Asthma 09/03/2015   Migraine headache 09/03/2015    Past Surgical History:  Procedure Laterality Date   PITUITARY SURGERY  2008   for adenoma   TONSILECTOMY, ADENOIDECTOMY, BILATERAL MYRINGOTOMY AND TUBES     TONSILLECTOMY     and adenoids   WISDOM TOOTH EXTRACTION      Current Outpatient  Medications  Medication Sig Dispense Refill   Rimegepant Sulfate (NURTEC) 75 MG TBDP Take 1 tablet (75 mg total) by mouth daily as needed. For migraines. Take as close to onset of migraine as possible. One daily maximum. 16 tablet 11   albuterol (VENTOLIN HFA) 108 (90 Base) MCG/ACT inhaler Inhale 2 puffs into the lungs every 4 (four) hours as needed.     ELDERBERRY PO Take by mouth.  Galcanezumab-gnlm (EMGALITY) 120 MG/ML SOAJ Inject 120 mg into the skin every 30 (thirty) days. 1 mL 11   Levonorgestrel-Eth Estradiol (TWIRLA) 120-30 MCG/24HR PTWK Apply 1 patch onto skin once weekly as directed for 21 days 3 patch 3   meloxicam (MOBIC) 15 MG tablet Take 1 tablet (15 mg total) by mouth daily. Take with food 14 tablet 0   Multiple Vitamin (MULTIVITAMIN PO) Take by mouth.     naratriptan (AMERGE) 2.5 MG tablet TAKE 1 TABLET BY MOUTH AT ONSET OF HEADACHE/MIGRAINE. IF RETURNS OR DOES NOT RESOLVE, MAY REPEAT AFTER 2 HOURS. DO NOT EXCEED 5MG  IN 24 HOURS 9 tablet 5   norelgestromin-ethinyl estradiol (ZAFEMY) 150-35 MCG/24HR transdermal patch Place 1 patch onto the skin once a week. 3 patch 7   ondansetron (ZOFRAN-ODT) 4 MG disintegrating tablet Take 1 tablet (4 mg total) by mouth every 8 (eight) hours as needed (for nausea associated with migraines). 30 tablet 3   PRESCRIPTION MEDICATION Take 60 mg by mouth daily. QULIPTA     propranolol (INDERAL) 80 MG tablet Take 1 tablet (80 mg total) by mouth 2 (two) times daily. 180 tablet 3   Ubrogepant (UBRELVY) 100 MG TABS Take 1 tablet (100 mg) by mouth every 2 (two) hours as needed. Maximum 2 tablets (200mg ) a day. 16 tablet 11   valACYclovir (VALTREX) 500 MG tablet Take 1 tablet (500 mg total) by mouth every 12 (twelve) hours as needed. For three days 30 tablet 2   No current facility-administered medications for this visit.    Allergies as of 01/19/2023 - Review Complete 01/19/2023  Allergen Reaction Noted   Shellfish allergy Itching, Swelling, Shortness  Of Breath, and Anaphylaxis 11/13/2014   Sulfa antibiotics Itching and Swelling 11/13/2014    Vitals: There were no vitals taken for this visit. Last Weight:  Wt Readings from Last 1 Encounters:  12/17/22 140 lb (63.5 kg)   Last Height:   Ht Readings from Last 1 Encounters:  12/17/22 5\' 5"  (1.651 m)    Physical exam: Exam: Gen: NAD, conversant      CV: Denies palpitations or chest pain or SOB. VS: Breathing at a normal rate. Not febrile. Eyes: Conjunctivae clear without exudates or hemorrhage  Neuro: Detailed Neurologic Exam  Speech:    Speech is normal; fluent and spontaneous with normal comprehension.  Cognition:    The patient is oriented to person, place, and time;     recent and remote memory intact;     language fluent;     normal attention, concentration,     fund of knowledge Cranial Nerves:    The pupils are equal, round, and reactive to light. Cannot perform fundoscopic exam. Visual fields are full to finger confrontation. Extraocular movements are intact.  The face is symmetric with normal sensation. The palate elevates in the midline. Hearing intact. Voice is normal. Shoulder shrug is normal. The tongue has normal motion without fasciculations.   Coordination:    Normal finger to nose  Gait:    Normal native gait  Motor Observation:   no involuntary movements noted. Tone:    Appears normal  Posture:    Posture is normal. normal erect    Strength:    Strength is anti-gravity and symmetric in the upper and lower limbs.      Sensation: intact to LT    Attempted, Could not perform over Web Video          Assessment/Plan:  Chronic daily migraines. Doing well; On emglaity, propranolol.  She is doing well. We just refilled. Any migraine now is around menstrual cycles, taking Bernita Raisin helps when they start twice a day and for the morst part keep them away. 4 migraines a month, ubrelvy not really helping as well, < 6 total headache days a month, try nurtec  instead of the ubrelvy. Discussed options. Take nurtec daily during period and as needed and see if it helps more than ubrelvy once daily. Could also try amerg with the ubrelvy still not a total winner. No other focal neurologic deficits, associated symptoms, inciting events or modifiable factors. Discussed options   01/19/2023: Take amerge OR nurtec or together at onset of headache. During period week take nurtec for 4 days daily in a row to try keep them away Continue emgality, refilled, prior to emgality had daily chronic migraines. Now on Emgality only 4 migraines a month and < 6 total headache days a month, ubrelvy not working great, try nurtec (tried imitrex, maxalt, naratriptan, ubrelvy) - At this time no need for repeat mri, had pituitary removal in 2008 but no symptoms. Repeated in 2017 and stable. She had pituitary adenoma in 2008 and removed.no vision changes, headaches are pretty much resolved, sees eye doctor and see sit all   Meds ordered this encounter  Medications   Rimegepant Sulfate (NURTEC) 75 MG TBDP    Sig: Take 1 tablet (75 mg total) by mouth daily as needed. For migraines. Take as close to onset of migraine as possible. One daily maximum.    Dispense:  16 tablet    Refill:  11    Discussed: To prevent or relieve headaches, try the following: Cool Compress. Lie down and place a cool compress on your head.  Avoid headache triggers. If certain foods or odors seem to have triggered your migraines in the past, avoid them. A headache diary might help you identify triggers.  Include physical activity in your daily routine. Try a daily walk or other moderate aerobic exercise.  Manage stress. Find healthy ways to cope with the stressors, such as delegating tasks on your to-do list.  Practice relaxation techniques. Try deep breathing, yoga, massage and visualization.  Eat regularly. Eating regularly scheduled meals and maintaining a healthy diet might help prevent headaches. Also,  drink plenty of fluids.  Follow a regular sleep schedule. Sleep deprivation might contribute to headaches Consider biofeedback. With this mind-body technique, you learn to control certain bodily functions -- such as muscle tension, heart rate and blood pressure -- to prevent headaches or reduce headache pain.    Proceed to emergency room if you experience new or worsening symptoms or symptoms do not resolve, if you have new neurologic symptoms or if headache is severe, or for any concerning symptom.   Provided education and documentation from American headache Society toolbox including articles on: chronic migraine medication overuse headache, chronic migraines, prevention of migraines, behavioral and other nonpharmacologic treatments for headache.  Call for any worsening  Cc: Pincus Sanes, MD,    Naomie Dean, MD  [facility] [address] [place], [place] [address]  Phone [phone] Fax [phone]

## 2023-01-20 ENCOUNTER — Other Ambulatory Visit (HOSPITAL_COMMUNITY): Payer: Self-pay

## 2023-01-25 ENCOUNTER — Encounter: Payer: Self-pay | Admitting: Internal Medicine

## 2023-01-25 NOTE — Progress Notes (Unsigned)
Subjective:    Patient ID: Jasmine Flores, female    DOB: 08-14-1986, 37 y.o.   MRN: [id number]      HPI Jasmine Flores is here for a Physical exam and her chronic medical problems.   Saw her for knee pain last month-prescribed meloxicam for 2 weeks.  Has some chronic knee issues.   Medications and allergies reviewed with patient and updated if appropriate.  Current Outpatient Medications on File Prior to Visit  Medication Sig Dispense Refill   albuterol (VENTOLIN HFA) 108 (90 Base) MCG/ACT inhaler Inhale 2 puffs into the lungs every 4 (four) hours as needed.     Docusate Sodium (DSS) 100 MG CAPS Colace     ELDERBERRY PO Take by mouth.     Galcanezumab-gnlm (EMGALITY) 120 MG/ML SOAJ Inject 120 mg into the skin every 30 (thirty) days. 1 mL 11   Levonorgestrel-Eth Estradiol (TWIRLA) 120-30 MCG/24HR PTWK Apply 1 patch onto skin once weekly as directed for 21 days 3 patch 3   meloxicam (MOBIC) 15 MG tablet Take 1 tablet (15 mg total) by mouth daily. Take with food 14 tablet 0   Multiple Vitamin (MULTIVITAMIN PO) Take by mouth.     naratriptan (AMERGE) 2.5 MG tablet TAKE 1 TABLET BY MOUTH AT ONSET OF HEADACHE/MIGRAINE. IF RETURNS OR DOES NOT RESOLVE, MAY REPEAT AFTER 2 HOURS. DO NOT EXCEED  IN 24 HOURS 9 tablet 5   norelgestromin-ethinyl estradiol (ZAFEMY) 150-35 MCG/24HR transdermal patch Place 1 patch onto the skin once a week. 3 patch 7   ondansetron (ZOFRAN-ODT) 4 MG disintegrating tablet Take 1 tablet (4 mg total) by mouth every 8 (eight) hours as needed (for nausea associated with migraines). 30 tablet 3   PRESCRIPTION MEDICATION Take 60 mg by mouth daily. QULIPTA     propranolol (INDERAL) 80 MG tablet Take 1 tablet (80 mg total) by mouth 2 (two) times daily. 180 tablet 3   Rimegepant Sulfate (NURTEC) 75 MG TBDP Take 1 tablet (75 mg total) by mouth daily as needed. For migraines. Take as close to onset of migraine as possible. One daily maximum. 16 tablet 11    Ubrogepant (UBRELVY) 100 MG TABS Take 1 tablet (100 mg) by mouth every 2 (two) hours as needed. Maximum 2 tablets ( ) a day. 16 tablet 11   valACYclovir (VALTREX) 500 MG tablet Take 1 tablet (500 mg total) by mouth every 12 (twelve) hours as needed. For three days 30 tablet 2   No current facility-administered medications on file prior to visit.    Review of Systems  Constitutional:  Negative for fever.  Eyes:  Negative for visual disturbance.  Respiratory:  Negative for cough, shortness of breath and wheezing.   Cardiovascular:  Positive for chest pain (with anxiety) and palpitations (with anxiety). Negative for leg swelling.  Gastrointestinal:  Positive for constipation. Negative for abdominal pain, blood in stool and diarrhea.       Occ gerd  Genitourinary:  Negative for dysuria.  Musculoskeletal:  Positive for arthralgias (knee) and back pain (chronic intermittent).       Right lower leg swelling/ pain since recent fall  Skin:  Negative for rash.  Neurological:  Positive for headaches (migraines - controlled). Negative for light-headedness.  Psychiatric/Behavioral:  Negative for dysphoric mood. The patient is not nervous/anxious.        Objective:   Vitals:   01/26/23 0821  BP: 110/76  Pulse: 70  Temp: 98 F (36.7 C)  SpO2: 96%   Filed  Weights   01/26/23 0821  Weight: 144 lb (65.3 kg)   Body mass index is 23.96 kg/m.  BP Readings from Last 3 Encounters:  01/26/23 110/76  12/17/22 116/80  05/19/22 110/78    Wt Readings from Last 3 Encounters:  01/26/23 144 lb (65.3 kg)  12/17/22 140 lb (63.5 kg)  05/19/22 138 lb 3.2 oz (62.7 kg)       Physical Exam Constitutional: She appears well-developed and well-nourished. No distress.  HENT:  Head: Normocephalic and atraumatic.  Right Ear: External ear normal. Normal ear canal and TM Left Ear: External ear normal.  Normal ear canal and TM Mouth/Throat: Oropharynx is clear and moist.  Eyes: Conjunctivae normal.   Neck: Neck supple. No tracheal deviation present. No thyromegaly present.  No carotid bruit  Cardiovascular: Normal rate, regular rhythm and normal heart sounds.   No murmur heard.  No edema. Pulmonary/Chest: Effort normal and breath sounds normal. No respiratory distress. She has no wheezes. She has no rales.  Breast: deferred   Abdominal: Soft. She exhibits no distension. There is no tenderness.  Lymphadenopathy: She has no cervical adenopathy.  Skin: Skin is warm and dry. She is not diaphoretic.  Psychiatric: She has a normal mood and affect. Her behavior is normal.     Lab Results  Component Value Date   WBC 5.4 01/22/2022   HGB 12.2 01/22/2022   HCT 37.7 01/22/2022   PLT 345.0 01/22/2022   GLUCOSE 92 01/22/2022   CHOL 152 01/22/2022   TRIG 119.0 01/22/2022   HDL 74.40 01/22/2022   LDLCALC 54 01/22/2022   ALT 9 01/22/2022   AST 14 01/22/2022   NA 138 01/22/2022   K 4.1 01/22/2022   CL 100 01/22/2022   CREATININE 0.66 01/22/2022   BUN 17 01/22/2022   CO2 29 01/22/2022   TSH 1.03 01/22/2022         Assessment & Plan:   Physical exam: Screening blood work  ordered Exercise  regularly Weight  normal Substance abuse  none   Reviewed recommended immunizations.   Health Maintenance  Topic Date Due   COVID-19 Vaccine (3 - 2023-24 season) 06/06/2022   INFLUENZA VACCINE  05/07/2023   PAP SMEAR-Modifier  02/29/2024   DTaP/Tdap/Td (3 - Td or Tdap) 06/25/2028   Hepatitis C Screening  Completed   HIV Screening  Completed   HPV VACCINES  Aged Out          See Problem List for Assessment and Plan of chronic medical problems.

## 2023-01-25 NOTE — Patient Instructions (Addendum)
Schedule an appointment with sports medicine downstairs for your knee pain.    Blood work was ordered.   The lab is on the first floor.    Medications changes include :   None    Return in about 1 year (around 01/26/2024) for Physical Exam.    Health Maintenance, Female Adopting a healthy lifestyle and getting preventive care are important in promoting health and wellness. Ask your health care provider about: The right schedule for you to have regular tests and exams. Things you can do on your own to prevent diseases and keep yourself healthy. What should I know about diet, weight, and exercise? Eat a healthy diet  Eat a diet that includes plenty of vegetables, fruits, low-fat dairy products, and lean protein. Do not eat a lot of foods that are high in solid fats, added sugars, or sodium. Maintain a healthy weight Body mass index (BMI) is used to identify weight problems. It estimates body fat based on height and weight. Your health care provider can help determine your BMI and help you achieve or maintain a healthy weight. Get regular exercise Get regular exercise. This is one of the most important things you can do for your health. Most adults should: Exercise for at least 150 minutes each week. The exercise should increase your heart rate and make you sweat (moderate-intensity exercise). Do strengthening exercises at least twice a week. This is in addition to the moderate-intensity exercise. Spend less time sitting. Even light physical activity can be beneficial. Watch cholesterol and blood lipids Have your blood tested for lipids and cholesterol at 39 years of age, then have this test every 5 years. Have your cholesterol levels checked more often if: Your lipid or cholesterol levels are high. You are older than 39 years of age. You are at high risk for heart disease. What should I know about cancer screening? Depending on your health history and family history, you  may need to have cancer screening at various ages. This may include screening for: Breast cancer. Cervical cancer. Colorectal cancer. Skin cancer. Lung cancer. What should I know about heart disease, diabetes, and high blood pressure? Blood pressure and heart disease High blood pressure causes heart disease and increases the risk of stroke. This is more likely to develop in people who have high blood pressure readings or are overweight. Have your blood pressure checked: Every 3-5 years if you are 47-31 years of age. Every year if you are 30 years old or older. Diabetes Have regular diabetes screenings. This checks your fasting blood sugar level. Have the screening done: Once every three years after age 7 if you are at a normal weight and have a low risk for diabetes. More often and at a younger age if you are overweight or have a high risk for diabetes. What should I know about preventing infection? Hepatitis B If you have a higher risk for hepatitis B, you should be screened for this virus. Talk with your health care provider to find out if you are at risk for hepatitis B infection. Hepatitis C Testing is recommended for: Everyone born from [birth year] through 1967. Anyone with known risk factors for hepatitis C. Sexually transmitted infections (STIs) Get screened for STIs, including gonorrhea and chlamydia, if: You are sexually active and are younger than 39 years of age. You are older than 39 years of age and your health care provider tells you that you are at risk for this type of infection. Your  sexual activity has changed since you were last screened, and you are at increased risk for chlamydia or gonorrhea. Ask your health care provider if you are at risk. Ask your health care provider about whether you are at high risk for HIV. Your health care provider may recommend a prescription medicine to help prevent HIV infection. If you choose to take medicine to prevent HIV, you should first  get tested for HIV. You should then be tested every 3 months for as long as you are taking the medicine. Pregnancy If you are about to stop having your period (premenopausal) and you may become pregnant, seek counseling before you get pregnant. Take 400 to 800 micrograms (mcg) of folic acid every day if you become pregnant. Ask for birth control (contraception) if you want to prevent pregnancy. Osteoporosis and menopause Osteoporosis is a disease in which the bones lose minerals and strength with aging. This can result in bone fractures. If you are 57 years old or older, or if you are at risk for osteoporosis and fractures, ask your health care provider if you should: Be screened for bone loss. Take a calcium or vitamin D supplement to lower your risk of fractures. Be given hormone replacement therapy (HRT) to treat symptoms of menopause. Follow these instructions at home: Alcohol use Do not drink alcohol if: Your health care provider tells you not to drink. You are pregnant, may be pregnant, or are planning to become pregnant. If you drink alcohol: Limit how much you have to: 0-1 drink a day. Know how much alcohol is in your drink. In the [place] one drink equals one 12 oz bottle of beer (355 mL), one 5 oz glass of wine (148 mL), or one 1 oz glass of hard liquor (44 mL). Lifestyle Do not use any products that contain nicotine or tobacco. These products include cigarettes, chewing tobacco, and vaping devices, such as e-cigarettes. If you need help quitting, ask your health care provider. Do not use street drugs. Do not share needles. Ask your health care provider for help if you need support or information about quitting drugs. General instructions Schedule regular health, dental, and eye exams. Stay current with your vaccines. Tell your health care provider if: You often feel depressed. You have ever been abused or do not feel safe at home. Summary Adopting a healthy lifestyle and  getting preventive care are important in promoting health and wellness. Follow your health care provider's instructions about healthy diet, exercising, and getting tested or screened for diseases. Follow your health care provider's instructions on monitoring your cholesterol and blood pressure. This information is not intended to replace advice given to you by your health care provider. Make sure you discuss any questions you have with your health care provider. Document Revised: 02/11/2021 Document Reviewed: 02/11/2021 Elsevier Patient Education  2023 [employer].

## 2023-01-26 ENCOUNTER — Other Ambulatory Visit (HOSPITAL_COMMUNITY): Payer: Self-pay

## 2023-01-26 ENCOUNTER — Ambulatory Visit (INDEPENDENT_AMBULATORY_CARE_PROVIDER_SITE_OTHER): Payer: 59 | Admitting: Internal Medicine

## 2023-01-26 VITALS — BP 110/76 | HR 70 | Temp 98.0°F | Ht 65.0 in | Wt 144.0 lb

## 2023-01-26 DIAGNOSIS — G43009 Migraine without aura, not intractable, without status migrainosus: Secondary | ICD-10-CM

## 2023-01-26 DIAGNOSIS — E010 Iodine-deficiency related diffuse (endemic) goiter: Secondary | ICD-10-CM | POA: Diagnosis not present

## 2023-01-26 DIAGNOSIS — M25569 Pain in unspecified knee: Secondary | ICD-10-CM

## 2023-01-26 DIAGNOSIS — A6000 Herpesviral infection of urogenital system, unspecified: Secondary | ICD-10-CM | POA: Diagnosis not present

## 2023-01-26 DIAGNOSIS — Z0000 Encounter for general adult medical examination without abnormal findings: Secondary | ICD-10-CM | POA: Diagnosis not present

## 2023-01-26 DIAGNOSIS — I10 Essential (primary) hypertension: Secondary | ICD-10-CM

## 2023-01-26 DIAGNOSIS — K219 Gastro-esophageal reflux disease without esophagitis: Secondary | ICD-10-CM

## 2023-01-26 LAB — LIPID PANEL
Cholesterol: 125 mg/dL (ref 0–200)
HDL: 58.7 mg/dL (ref >39.00)
LDL Cholesterol: 43 mg/dL (ref 0–99)
NonHDL: 66.33
Total CHOL/HDL Ratio: 2
Triglycerides: 115 mg/dL (ref 0.0–149.0)
VLDL: 23 mg/dL (ref 0.0–40.0)

## 2023-01-26 LAB — CBC WITH DIFFERENTIAL/PLATELET
Basophils Absolute: 0 10*3/uL (ref 0.0–0.1)
Basophils Relative: 0.1 % (ref 0.0–3.0)
Eosinophils Absolute: 0.1 10*3/uL (ref 0.0–0.7)
Eosinophils Relative: 2.6 % (ref 0.0–5.0)
HCT: 36.3 % (ref 36.0–46.0)
Hemoglobin: 11.9 g/dL — ABNORMAL LOW (ref 12.0–15.0)
Lymphocytes Relative: 23.1 % (ref 12.0–46.0)
Lymphs Abs: 1.3 10*3/uL (ref 0.7–4.0)
MCHC: 32.8 g/dL (ref 30.0–36.0)
MCV: 76.8 fl — ABNORMAL LOW (ref 78.0–100.0)
Monocytes Absolute: 0.6 10*3/uL (ref 0.1–1.0)
Monocytes Relative: 10.1 % (ref 3.0–12.0)
Neutro Abs: 3.6 10*3/uL (ref 1.4–7.7)
Neutrophils Relative %: 64.1 % (ref 43.0–77.0)
Platelets: 435 10*3/uL — ABNORMAL HIGH (ref 150.0–400.0)
RBC: 4.73 Mil/uL (ref 3.87–5.11)
RDW: 14.3 % (ref 11.5–15.5)
WBC: 5.6 10*3/uL (ref 4.0–10.5)

## 2023-01-26 LAB — COMPREHENSIVE METABOLIC PANEL
ALT: 14 U/L (ref 0–35)
AST: 16 U/L (ref 0–37)
Albumin: 4 g/dL (ref 3.5–5.2)
Alkaline Phosphatase: 72 U/L (ref 39–117)
BUN: 15 mg/dL (ref 6–23)
CO2: 30 mEq/L (ref 19–32)
Calcium: 8.9 mg/dL (ref 8.4–10.5)
Chloride: 101 mEq/L (ref 96–112)
Creatinine, Ser: 0.62 mg/dL (ref 0.40–1.20)
GFR: 112.39 mL/min (ref >60.00)
Glucose, Bld: 100 mg/dL — ABNORMAL HIGH (ref 70–99)
Potassium: 4 mEq/L (ref 3.5–5.1)
Sodium: 137 mEq/L (ref 135–145)
Total Bilirubin: 0.5 mg/dL (ref 0.2–1.2)
Total Protein: 7.2 g/dL (ref 6.0–8.3)

## 2023-01-26 LAB — TSH: TSH: 1.13 u[IU]/mL (ref 0.35–5.50)

## 2023-01-26 MED ORDER — PROPRANOLOL HCL 80 MG PO TABS
80.0000 mg | ORAL_TABLET | Freq: Two times a day (BID) | ORAL | 3 refills | Status: DC
  Filled 2023-01-26: qty 62, 31d supply, fill #0
  Filled 2023-03-10: qty 62, 31d supply, fill #1
  Filled 2023-04-17: qty 62, 31d supply, fill #2
  Filled 2023-05-22: qty 62, 31d supply, fill #3
  Filled 2023-06-26: qty 62, 31d supply, fill #4
  Filled 2023-07-30: qty 62, 31d supply, fill #5
  Filled 2023-09-10: qty 62, 31d supply, fill #6
  Filled 2023-10-20: qty 62, 31d supply, fill #7
  Filled 2023-11-18: qty 62, 31d supply, fill #8
  Filled 2023-12-22: qty 62, 31d supply, fill #9
  Filled 2024-01-18: qty 62, 31d supply, fill #10

## 2023-01-26 NOTE — Assessment & Plan Note (Signed)
Only occ gerd Not on medication - prn medication only

## 2023-01-26 NOTE — Assessment & Plan Note (Signed)
Chronic-ultrasound 2018 showed upper limits of normal Check TSH

## 2023-01-26 NOTE — Assessment & Plan Note (Signed)
Chronic Controlled Management per Dr Lucia Gaskins

## 2023-01-26 NOTE — Assessment & Plan Note (Signed)
Chronic, intermittent Continue Valtrex 500 mg every 12 hours x 3 days as needed-gets rare flares

## 2023-01-26 NOTE — Assessment & Plan Note (Signed)
Chronic Blood pressure well controlled CMP, lipid, CBC Continue propranolol 80 mg twice daily

## 2023-01-27 ENCOUNTER — Encounter: Payer: Self-pay | Admitting: Internal Medicine

## 2023-01-29 ENCOUNTER — Other Ambulatory Visit (HOSPITAL_COMMUNITY): Payer: Self-pay

## 2023-01-29 NOTE — Progress Notes (Signed)
[facility] [address] [place] [address] Phone: [phone]   Assessment and Plan:     1. Chronic pain of left knee -Chronic with exacerbation, initial sports medicine visit - Left knee pain primarily medial and anterior has been intermittent for years with relative increase in frequency over the past 6 months and more so over the past 3 weeks since falling onto the - X-ray obtained in clinic.  My interpretation: No acute fracture or dislocation.  Decreased medial joint space - Patient's pain is relatively well-controlled after completing several days of meloxicam. - Recommend using Tylenol as needed for day-to-day pain relief - May use meloxicam as needed for breakthrough pain - Start HEP and physical therapy for knee.  Referral sent - DG Knee AP/LAT W/Sunrise Left; Future    Pertinent previous records reviewed include family medicine note 01/26/2023   Follow Up: 1-2 months for reevaluation      Subjective:   I, Jasmine Flores, am serving as a Neurosurgeon for Doctor Richardean Sale  Chief Complaint: left knee pain   HPI:   01/30/2023 Patient is a 37 year old female complaining of left knee pain. Patient states she has intermittent flares of pain hx of knee injury that was not seen by PCP , states she has moment of swelling, pain when walking , tylenol for the pain and meloxicam that helped with the pain , no numbness or tingling, no radiating pain,   Relevant Historical Information: HTN, GERD  Additional pertinent review of systems negative.   Current Outpatient Medications:    albuterol (VENTOLIN HFA) 108 (90 Base) MCG/ACT inhaler, Inhale 2 puffs into the lungs every 4 (four) hours as needed., Disp: , Rfl:    Docusate Sodium (DSS) 100 MG CAPS, Colace, Disp: , Rfl:    ELDERBERRY PO, Take by mouth., Disp: , Rfl:    Galcanezumab-gnlm (EMGALITY) 120 MG/ML SOAJ, Inject 120 mg into the skin every 30 (thirty) days., Disp: 1  mL, Rfl: 11   Levonorgestrel-Eth Estradiol (TWIRLA) 120-30 MCG/24HR PTWK, Apply 1 patch onto skin once weekly as directed for 21 days, Disp: 3 patch, Rfl: 3   meloxicam (MOBIC) 15 MG tablet, Take 1 tablet (15 mg total) by mouth daily. Take with food, Disp: 14 tablet, Rfl: 0   Multiple Vitamin (MULTIVITAMIN PO), Take by mouth., Disp: , Rfl:    naratriptan (AMERGE) 2.5 MG tablet, TAKE 1 TABLET BY MOUTH AT ONSET OF HEADACHE/MIGRAINE. IF RETURNS OR DOES NOT RESOLVE, MAY REPEAT AFTER 2 HOURS. DO NOT EXCEED 5MG  IN 24 HOURS, Disp: 9 tablet, Rfl: 5   norelgestromin-ethinyl estradiol (ZAFEMY) 150-35 MCG/24HR transdermal patch, Place 1 patch onto the skin once a week., Disp: 3 patch, Rfl: 7   ondansetron (ZOFRAN-ODT) 4 MG disintegrating tablet, Take 1 tablet (4 mg total) by mouth every 8 (eight) hours as needed (for nausea associated with migraines)., Disp: 30 tablet, Rfl: 3   PRESCRIPTION MEDICATION, Take 60 mg by mouth daily. QULIPTA, Disp: , Rfl:    propranolol (INDERAL) 80 MG tablet, Take 1 tablet (80 mg total) by mouth 2 (two) times daily., Disp: 180 tablet, Rfl: 3   Rimegepant Sulfate (NURTEC) 75 MG TBDP, Take 1 tablet (75 mg total) by mouth daily as needed. For migraines. Take as close to onset of migraine as possible. One daily maximum., Disp: 16 tablet, Rfl: 11   Ubrogepant (UBRELVY) 100 MG TABS, Take 1 tablet (100 mg) by mouth every 2 (two) hours as needed.  Maximum 2 tablets (200mg ) a day., Disp: 16 tablet, Rfl: 11   valACYclovir (VALTREX) 500 MG tablet, Take 1 tablet (500 mg total) by mouth every 12 (twelve) hours as needed. For three days, Disp: 30 tablet, Rfl: 2   Objective:     Vitals:   01/30/23 1442  Pulse: 75  SpO2: 99%  Weight: 144 lb (65.3 kg)  Height: 5\' 5"  (1.651 m)      Body mass index is 23.96 kg/m.    Physical Exam:    General:  awake, alert oriented, no acute distress nontoxic Skin: no suspicious lesions or rashes Neuro:sensation intact and strength 5/5 with no deficits,  no atrophy, normal muscle tone Psych: No signs of anxiety, depression or other mood disorder  left Knee: No swelling No deformity Neg fluid wave, joint milking ROM Flex 110, Ext 0 NTTP over the quad tendon, medial fem condyle, lat fem condyle, patella, plica, patella tendon, tibial tuberostiy, fibular head, posterior fossa, pes anserine bursa, gerdy's tubercle, medial jt line, lateral jt line Neg anterior and posterior drawer Neg lachman Neg sag sign Negative varus stress Negative valgus stress for laxity, + for pain Negative McMurray Negative Thessaly Pain over anterior knee with single leg squat  Gait normal    Electronically signed by:  [facility] 3:02 PM 01/30/23

## 2023-01-30 ENCOUNTER — Ambulatory Visit (INDEPENDENT_AMBULATORY_CARE_PROVIDER_SITE_OTHER): Payer: 59

## 2023-01-30 ENCOUNTER — Ambulatory Visit: Payer: 59 | Admitting: Sports Medicine

## 2023-01-30 VITALS — HR 75 | Ht 65.0 in | Wt 144.0 lb

## 2023-01-30 DIAGNOSIS — G8929 Other chronic pain: Secondary | ICD-10-CM

## 2023-01-30 DIAGNOSIS — M25562 Pain in left knee: Secondary | ICD-10-CM

## 2023-01-30 NOTE — Patient Instructions (Signed)
Pt referral  Knee HEP  Use tylenol for day to day pain relief  Use meloxicam as needed for breakthrough pain  1-2 month follow up

## 2023-02-25 NOTE — Therapy (Signed)
OUTPATIENT PHYSICAL THERAPY LOWER EXTREMITY EVALUATION   Patient Name: Jasmine Flores MRN: [id number] DOB:1986/08/31, 37 y.o., female Today's Date: 02/26/2023  END OF SESSION:  PT End of Session - 02/26/23 1544     Visit Number 1    Number of Visits 9    Date for PT Re-Evaluation 04/23/23    Authorization Type [employer]    PT Start Time 1545    PT Stop Time 1631    PT Time Calculation (min) 46 min    Activity Tolerance Patient tolerated treatment well    Behavior During Therapy WFL for tasks assessed/performed             Past Medical History:  Diagnosis Date   Alpha trait thalassemia    Anemia    Asthma    Headache    HSV infection    Migraines    Positive TB test    Pregnancy induced hypertension    Vaginal Pap smear, abnormal    Past Surgical History:  Procedure Laterality Date   PITUITARY SURGERY  2008   for adenoma   TONSILECTOMY, ADENOIDECTOMY, BILATERAL MYRINGOTOMY AND TUBES     TONSILLECTOMY     and adenoids   WISDOM TOOTH EXTRACTION     Patient Active Problem List   Diagnosis Date Noted   Left knee pain 12/17/2022   Genital herpes 12/17/2022   Migraine without aura and without status migrainosus, not intractable - Dr Lucia Gaskins 05/19/2022   History of peptic ulcer disease 05/10/2020   Hypertension 06/21/2019   Gastroesophageal reflux disease 04/19/2019   Chronic constipation 03/26/2019   History of gestational hypertension 07/01/2018   Palpitations 06/22/2017   Thyromegaly 06/22/2017   Lung nodules, stable no f/u needed 12/28/2016   Pituitary adenoma (HCC) 09/08/2016   Asthma 09/03/2015    PCP: Pincus Sanes, MD  REFERRING PROVIDER: Richardean Sale, DO  REFERRING DIAG: [phone] (ICD-10-CM) - Chronic pain of left knee  THERAPY DIAG:  Left knee pain, unspecified chronicity  Muscle weakness (generalized)  Rationale for Evaluation and Treatment: Rehabilitation  ONSET DATE: several years, worsened since earlier this  year  SUBJECTIVE:   SUBJECTIVE STATEMENT: Endorses knee pain since adolescence, 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 States that it resolved after a few days but afterwards would have periodic episodes of swelling/pain that would persist for a day or two but not usually persistent. Had a more recent episode that persisted 2-3 weeks earlier this year which prompted seeking out care. Notes that she tends to have a limp when it is worst. Feels she is pretty much back to baseline since most recent flare. Feels she is guarding her knee more. Has uncomfortable popping at times, feels unstable. Pain and subjective instability w/ stair navigation and prolonged walking.   PERTINENT HISTORY: HTN, migraine, asthma, GERD, palpitations PAIN:  Are you having pain: none Location/description: R knee, medial/anterior  Best-worst over past week: 0-4/10  - aggravating factors: walking, going up stairs  - Easing factors: tylenol, rest    PRECAUTIONS: none  WEIGHT BEARING RESTRICTIONS: No  FALLS:  Has patient fallen in last 6 months? Yes. 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, Advertising copywriter;  mostly sedentary   PLOF: Independent  PATIENT GOALS: less pain, be able to participate with kids more  NEXT MD VISIT: June   OBJECTIVE:   DIAGNOSTIC FINDINGS:  Unremarkable L knee XR 02/02/23, refer to epic for details  PATIENT SURVEYS:  FOTO 71 current, 78 predicted  COGNITION: Overall cognitive status: Within functional limits for tasks assessed     SENSATION: NT  EDEMA:  Unable to assess given pt attire - she endorses fluctuating swelling  PALPATION: Concordant tenderness L medial joint line, patellar tendon, adductors,  medial quad   LOWER EXTREMITY ROM:     Active  Right eval Left eval  Hip flexion    Hip extension    Hip internal rotation    Hip external rotation    Knee extension 0 0  Knee flexion 138 WNL  (Blank rows = not tested) (Key: WFL = within functional limits not formally assessed, * = concordant pain, s = stiffness/stretching sensation, NT = not tested)  Comments:    LOWER EXTREMITY MMT:    MMT Right eval Left eval  Hip flexion 4+ 4+ *  Hip abduction (modified sitting) 5 5  Hip internal rotation 4+ 4 mild pain  Hip external rotation 4+ 4 *  Knee flexion 5 4+ * (anterior pain)  Knee extension 5 4+ painless   Ankle dorsiflexion     (Blank rows = not tested) (Key: WFL = within functional limits not formally assessed, * = concordant pain, s = stiffness/stretching sensation, NT = not tested)  Comments:    LOWER EXTREMITY SPECIAL TESTS:  Patellar mobility: hypermobile and painless in all directions bilaterally  FUNCTIONAL TESTS:  5xSTS: 9.32sec no UE support    GAIT: Distance walked: within clinic Assistive device utilized: None Level of assistance: Complete Independence Comments: gait mechanics grossly WNL   TODAY'S TREATMENT:                                                                                                                              [facility] Adult PT Treatment:                                                DATE: 02/26/23 Therapeutic Exercise: SLR x5 LLE cues for form and pacing, foot positioning HEP handout + education   PATIENT EDUCATION:  Education details: Pt education on PT impairments, prognosis, and POC. Informed consent. Rationale for interventions, safe/appropriate HEP performance Person educated: Patient Education method: Explanation, Demonstration, Tactile cues, Verbal cues, and Handouts Education comprehension: verbalized understanding, returned demonstration, verbal cues required, tactile cues required, and needs further education    HOME  EXERCISE PROGRAM: Access Code: [email or web address] URL: [email or web address] Date: 02/26/2023 Prepared by: Fransisco Hertz  Exercises - Supine Active Straight Leg Raise  - 1 x daily - 7 x weekly - 4 sets -  5 reps  ASSESSMENT:  CLINICAL IMPRESSION: Pt is a pleasant 37 year old woman who arrives to PT evaluation on this date for chronic L knee pain. Pt reports difficulty with stair ascension, prolonged walking, and childcare activities due to pain. During today's session pt demonstrates patellar hypomobility, L hip/knee weakness, and and concordant tenderness of medial/anterior knee musculature, all of which are likely contributory to difficulty with aforementioned activities. Recommend skilled PT to address aforementioned deficits to improve functional independence/tolerance. No adverse events, tolerates exam/HEP well overall. Pt departs today's session in no acute distress, all voiced questions/concerns addressed appropriately from PT perspective.    OBJECTIVE IMPAIRMENTS: Abnormal gait, decreased activity tolerance, decreased endurance, decreased mobility, difficulty walking, decreased ROM, decreased strength, hypomobility, improper body mechanics, and pain.   ACTIVITY LIMITATIONS: standing, squatting, stairs, transfers, locomotion level, and caring for others  PARTICIPATION LIMITATIONS: meal prep, cleaning, laundry, interpersonal relationship, and community activity  PERSONAL FACTORS: Time since onset of injury/illness/exacerbation and 3+ comorbidities: HTN, migraines, asthma, palpitations  are also affecting patient's functional outcome.   REHAB POTENTIAL: Good  CLINICAL DECISION MAKING: Stable/uncomplicated  EVALUATION COMPLEXITY: Low   GOALS: Goals reviewed with patient? No  SHORT TERM GOALS: Target date: 03/26/2023 Pt will demonstrate appropriate understanding and performance of initially prescribed HEP in order to facilitate improved independence with management of symptoms.   Baseline: HEP provided on eval Goal status: INITIAL   2. Pt will score greater than or equal to 74 on FOTO in order to demonstrate improved perception of function due to symptoms.  Baseline: 71  Goal status: INITIAL    LONG TERM GOALS: Target date: 04/23/2023 Pt will score 78 or greater on FOTO in order to demonstrate improved perception of function due to symptoms.  Baseline: 71 Goal status: INITIAL  2.  Pt will demonstrate grossly symmetrical hip/knee MMT in order to facilitate improved tolerance to functional movements such as squatting/stairs.  Baseline: see MMT chart above Goal status: INITIAL  3.  Pt will be able to navigate 15 stairs without UE support and less than 2 pt increase in pain in order to improve comfort with home navigation. Baseline: increased pain going up stairs at home Goal status: INITIAL  4.  Pt will be able to perform 5xSTS in less than or equal to 7sec in order to demonstrate reduced fall risk and improved functional independence (MCID of 2.3sec, age cohort norm 6.2+/-1.3sec per Billie Ruddy et al 2007)  Baseline: 9.32sec Goal status: INITIAL   5. Pt will report at least 50% decrease in overall pain levels in past week in order to facilitate improved tolerance to basic ADLs/mobility.   Baseline: 0-4/10  Goal status: INITIAL    6. Pt will demonstrate appropriate performance of final prescribed HEP in order to facilitate improved self-management of symptoms post-discharge.   Baseline: initial HEP prescribed  Goal status: INITIAL     PLAN:  PT FREQUENCY: 1-2x/week  PT DURATION: 8 weeks  PLANNED INTERVENTIONS: Therapeutic exercises, Therapeutic activity, Neuromuscular re-education, Balance training, Gait training, Patient/Family education, Self Care, Joint mobilization, Joint manipulation, Stair training, Aquatic Therapy, Dry Needling, Cryotherapy, Moist heat, Taping, Manual therapy, and Re-evaluation  PLAN FOR NEXT SESSION: Review/update HEP PRN. Work on  [employer] exercises as appropriate with emphasis on knee stability in open and closed chain. Work on hamstrings, adductors, and quads. Symptom modification strategies as indicated/appropriate.    Ashley Murrain PT, DPT 02/26/2023 5:33 PM

## 2023-02-26 ENCOUNTER — Other Ambulatory Visit: Payer: Self-pay

## 2023-02-26 ENCOUNTER — Encounter: Payer: Self-pay | Admitting: Physical Therapy

## 2023-02-26 ENCOUNTER — Other Ambulatory Visit: Payer: Self-pay | Admitting: Internal Medicine

## 2023-02-26 ENCOUNTER — Ambulatory Visit: Payer: 59 | Attending: Sports Medicine | Admitting: Physical Therapy

## 2023-02-26 DIAGNOSIS — G8929 Other chronic pain: Secondary | ICD-10-CM | POA: Insufficient documentation

## 2023-02-26 DIAGNOSIS — M6281 Muscle weakness (generalized): Secondary | ICD-10-CM | POA: Diagnosis present

## 2023-02-26 DIAGNOSIS — M25562 Pain in left knee: Secondary | ICD-10-CM | POA: Diagnosis present

## 2023-02-26 MED ORDER — ONDANSETRON 4 MG PO TBDP
4.0000 mg | ORAL_TABLET | Freq: Three times a day (TID) | ORAL | 3 refills | Status: AC | PRN
  Filled 2023-02-26: qty 30, 10d supply, fill #0

## 2023-02-27 ENCOUNTER — Other Ambulatory Visit (HOSPITAL_COMMUNITY): Payer: Self-pay

## 2023-02-27 ENCOUNTER — Other Ambulatory Visit: Payer: Self-pay

## 2023-02-27 NOTE — Therapy (Signed)
OUTPATIENT PHYSICAL THERAPY TREATMENT NOTE   Patient Name: Jasmine Flores MRN: [id number] DOB:12/07/1985, 37 y.o., female Today's Date: 03/03/2023  END OF SESSION:  PT End of Session - 03/03/23 1502     Visit Number 2    Number of Visits 9    Date for PT Re-Evaluation 04/23/23    Authorization Type [employer]    PT Start Time 1503    PT Stop Time 1543    PT Time Calculation (min) 40 min    Activity Tolerance Patient tolerated treatment well;No increased pain    Behavior During Therapy WFL for tasks assessed/performed              Past Medical History:  Diagnosis Date   Alpha trait thalassemia    Anemia    Asthma    Headache    HSV infection    Migraines    Positive TB test    Pregnancy induced hypertension    Vaginal Pap smear, abnormal    Past Surgical History:  Procedure Laterality Date   PITUITARY SURGERY  2008   for adenoma   TONSILECTOMY, ADENOIDECTOMY, BILATERAL MYRINGOTOMY AND TUBES     TONSILLECTOMY     and adenoids   WISDOM TOOTH EXTRACTION     Patient Active Problem List   Diagnosis Date Noted   Left knee pain 12/17/2022   Genital herpes 12/17/2022   Migraine without aura and without status migrainosus, not intractable - Dr Lucia Gaskins 05/19/2022   History of peptic ulcer disease 05/10/2020   Hypertension 06/21/2019   Gastroesophageal reflux disease 04/19/2019   Chronic constipation 03/26/2019   History of gestational hypertension 07/01/2018   Palpitations 06/22/2017   Thyromegaly 06/22/2017   Lung nodules, stable no f/u needed 12/28/2016   Pituitary adenoma (HCC) 09/08/2016   Asthma 09/03/2015    PCP: Pincus Sanes, MD  REFERRING PROVIDER: Richardean Sale, DO  REFERRING DIAG: [phone] (ICD-10-CM) - Chronic pain of left knee  THERAPY DIAG:  Left knee pain, unspecified chronicity  Muscle weakness (generalized)  Rationale for Evaluation and Treatment: Rehabilitation  ONSET DATE: several years, worsened since earlier this  year  SUBJECTIVE:  Per eval - Endorses knee pain since adolescence, 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 States that it resolved after a few days but afterwards would have periodic episodes of swelling/pain that would persist for a day or two but not usually persistent. Had a more recent episode that persisted 2-3 weeks earlier this year which prompted seeking out care. Notes that she tends to have a limp when it is worst. Feels she is pretty much back to baseline since most recent flare. Feels she is guarding her knee more. Has uncomfortable popping at times, feels unstable. Pain and subjective instability w/ stair navigation and prolonged walking.   SUBJECTIVE STATEMENT: 03/03/2023 Pt states her knee was bothering her quite a bit after initial eval but resolved with tylenol. Does feel that HEP has helped with symptom control. No other new updates    PERTINENT HISTORY: HTN, migraine, asthma, GERD, palpitations PAIN:  Are you having pain: none  Location/description: R knee, medial/anterior   Per eval -  Best-worst over past week: 0-4/10  - aggravating factors: walking, going up stairs  - Easing factors: tylenol, rest    PRECAUTIONS: none  WEIGHT BEARING RESTRICTIONS: No  FALLS:  Has patient fallen in last 6 months? Yes. 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, Advertising copywriter; mostly sedentary   PLOF: Independent  PATIENT GOALS: less pain, be able to participate with kids more  NEXT MD VISIT: June   OBJECTIVE: (objective measures completed at initial evaluation unless otherwise dated)   DIAGNOSTIC FINDINGS:  Unremarkable L knee XR 02/02/23, refer to epic for details  PATIENT SURVEYS:  FOTO 71 current,  78 predicted  COGNITION: Overall cognitive status: Within functional limits for tasks assessed     SENSATION: NT  EDEMA:  Unable to assess given pt attire - she endorses fluctuating swelling  PALPATION: Concordant tenderness L medial joint line, patellar tendon, adductors, medial quad   LOWER EXTREMITY ROM:     Active  Right eval Left eval  Hip flexion    Hip extension    Hip internal rotation    Hip external rotation    Knee extension 0 0  Knee flexion 138 WNL  (Blank rows = not tested) (Key: WFL = within functional limits not formally assessed, * = concordant pain, s = stiffness/stretching sensation, NT = not tested)  Comments:    LOWER EXTREMITY MMT:    MMT Right eval Left eval  Hip flexion 4+ 4+ *  Hip abduction (modified sitting) 5 5  Hip internal rotation 4+ 4 mild pain  Hip external rotation 4+ 4 *  Knee flexion 5 4+ * (anterior pain)  Knee extension 5 4+ painless   Ankle dorsiflexion     (Blank rows = not tested) (Key: WFL = within functional limits not formally assessed, * = concordant pain, s = stiffness/stretching sensation, NT = not tested)  Comments:    LOWER EXTREMITY SPECIAL TESTS:  Patellar mobility: hypermobile and painless in all directions bilaterally  FUNCTIONAL TESTS:  5xSTS: 9.32sec no UE support    GAIT: Distance walked: within clinic Assistive device utilized: None Level of assistance: Complete Independence Comments: gait mechanics grossly WNL   TODAY'S TREATMENT:                                                                                                                              [facility] Adult PT Treatment:                                                DATE: 03/03/23 Therapeutic Exercise: SLR 4x5 cues for pacing  Prone hamstring curls BW 2x8 cues for comfortable ROM, emphasis on eccentric control BW clams 3x8 LLE only cues for setup Prone bent knee hip extension x8 cues for improved knee control Prone straight leg hip  extension x8 cues for appropriate form/pacing Wall sit 45  deg 2x45sec cues for appropriate weight shift HEP update + education/handout   [facility] Adult PT Treatment:                                                DATE: 02/26/23 Therapeutic Exercise: SLR x5 LLE cues for form and pacing, foot positioning HEP handout + education   PATIENT EDUCATION:  Education details: rationale for interventions, HEP  Person educated: Patient Education method: Explanation, Demonstration, Tactile cues, Verbal cues, and Handouts Education comprehension: verbalized understanding, returned demonstration, verbal cues required, tactile cues required, and needs further education    HOME EXERCISE PROGRAM: Access Code: [email or web address] URL: [email or web address] Date: 03/03/2023 Prepared by: Fransisco Hertz  Exercises - Supine Active Straight Leg Raise  - 1 x daily - 7 x weekly - 4 sets - 5 reps - Wall Quarter Squat  - 1 x daily - 7 x weekly - 1 sets - 2 reps - 45sec hold - Clamshell  - 1 x daily - 7 x weekly - 3 sets - 8 reps  ASSESSMENT:  CLINICAL IMPRESSION: 03/03/2023 Pt arrives w/o pain, reports being relatively sore after initial eval but notes that HEP has been helpful with symptom management. HEP review goes well today, progression for volume. Today focusing on expansion of program to include hamstring and hip strength/endurance activities, as well as closed chain quad stability. Pt tolerates well with report of muscular fatigue but no increase in resting pain as session goes on. No adverse events. Recommend continuing along current POC in order to address relevant deficits and improve functional tolerance. Pt departs today's session in no acute distress, all voiced questions/concerns addressed appropriately from PT perspective.      Per eval - Pt is a pleasant 37 year old woman who arrives to PT evaluation on this date for chronic L knee pain. Pt reports difficulty with stair ascension, prolonged  walking, and childcare activities due to pain. During today's session pt demonstrates patellar hypomobility, L hip/knee weakness, and and concordant tenderness of medial/anterior knee musculature, all of which are likely contributory to difficulty with aforementioned activities. Recommend skilled PT to address aforementioned deficits to improve functional independence/tolerance. No adverse events, tolerates exam/HEP well overall. Pt departs today's session in no acute distress, all voiced questions/concerns addressed appropriately from PT perspective.    OBJECTIVE IMPAIRMENTS: Abnormal gait, decreased activity tolerance, decreased endurance, decreased mobility, difficulty walking, decreased ROM, decreased strength, hypomobility, improper body mechanics, and pain.   ACTIVITY LIMITATIONS: standing, squatting, stairs, transfers, locomotion level, and caring for others  PARTICIPATION LIMITATIONS: meal prep, cleaning, laundry, interpersonal relationship, and community activity  PERSONAL FACTORS: Time since onset of injury/illness/exacerbation and 3+ comorbidities: HTN, migraines, asthma, palpitations  are also affecting patient's functional outcome.   REHAB POTENTIAL: Good  CLINICAL DECISION MAKING: Stable/uncomplicated  EVALUATION COMPLEXITY: Low   GOALS: Goals reviewed with patient? No  SHORT TERM GOALS: Target date: 03/26/2023 Pt will demonstrate appropriate understanding and performance of initially prescribed HEP in order to facilitate improved independence with management of symptoms.  Baseline: HEP provided on eval Goal status: INITIAL   2. Pt will score greater than or equal to 74 on FOTO in order to demonstrate improved perception of function due to symptoms.  Baseline: 71  Goal status: INITIAL    LONG TERM GOALS: Target date: 04/23/2023 Pt will score 78 or greater on FOTO in order  to demonstrate improved perception of function due to symptoms.  Baseline: 71 Goal status:  INITIAL  2.  Pt will demonstrate grossly symmetrical hip/knee MMT in order to facilitate improved tolerance to functional movements such as squatting/stairs.  Baseline: see MMT chart above Goal status: INITIAL  3.  Pt will be able to navigate 15 stairs without UE support and less than 2 pt increase in pain in order to improve comfort with home navigation. Baseline: increased pain going up stairs at home Goal status: INITIAL  4.  Pt will be able to perform 5xSTS in less than or equal to 7sec in order to demonstrate reduced fall risk and improved functional independence (MCID of 2.3sec, age cohort norm 6.2+/-1.3sec per Billie Ruddy et al 2007)  Baseline: 9.32sec Goal status: INITIAL   5. Pt will report at least 50% decrease in overall pain levels in past week in order to facilitate improved tolerance to basic ADLs/mobility.   Baseline: 0-4/10  Goal status: INITIAL    6. Pt will demonstrate appropriate performance of final prescribed HEP in order to facilitate improved self-management of symptoms post-discharge.   Baseline: initial HEP prescribed  Goal status: INITIAL     PLAN:  PT FREQUENCY: 1-2x/week  PT DURATION: 8 weeks  PLANNED INTERVENTIONS: Therapeutic exercises, Therapeutic activity, Neuromuscular re-education, Balance training, Gait training, Patient/Family education, Self Care, Joint mobilization, Joint manipulation, Stair training, Aquatic Therapy, Dry Needling, Cryotherapy, Moist heat, Taping, Manual therapy, and Re-evaluation  PLAN FOR NEXT SESSION: Review/update HEP PRN. Work on [employer] exercises as appropriate with emphasis on knee stability in open and closed chain. Work on hamstrings, adductors, and quads. Symptom modification strategies as indicated/appropriate.     Ashley Murrain PT, DPT 03/03/2023 4:02 PM

## 2023-03-03 ENCOUNTER — Encounter: Payer: Self-pay | Admitting: Physical Therapy

## 2023-03-03 ENCOUNTER — Ambulatory Visit: Payer: 59 | Admitting: Physical Therapy

## 2023-03-03 DIAGNOSIS — M6281 Muscle weakness (generalized): Secondary | ICD-10-CM

## 2023-03-03 DIAGNOSIS — M25562 Pain in left knee: Secondary | ICD-10-CM | POA: Diagnosis not present

## 2023-03-06 ENCOUNTER — Other Ambulatory Visit (HOSPITAL_COMMUNITY): Payer: Self-pay

## 2023-03-06 NOTE — Progress Notes (Signed)
Aleen Sells D.[facility] [address] [place] [address] Phone: [phone]   Assessment and Plan:     1. Chronic pain of left knee  -Chronic with exacerbation, subsequent visit - Patient's pain has overall been well-controlled with HEP, physical therapy - Patient has not had to use meloxicam and instead is only needed Tylenol rare occasion - Recommend continuing HEP and physical therapy - May use meloxicam as needed for breakthrough pain  Pertinent previous records reviewed include none   Follow Up: As needed   Subjective:   I, Jerene Canny, am serving as a Neurosurgeon for Doctor Richardean Sale   Chief Complaint: left knee pain    HPI:    01/30/2023 Patient is a 37 year old female complaining of left knee pain. Patient states she has intermittent flares of pain hx of knee injury that was not seen by PCP , states she has moment of swelling, pain when walking , tylenol for the pain and meloxicam that helped with the pain , no numbness or tingling, no radiating pain,   03/13/2023 Patient states that she is doing well     Relevant Historical Information: HTN, GERD    Additional pertinent review of systems negative.   Current Outpatient Medications:    albuterol (VENTOLIN HFA) 108 (90 Base) MCG/ACT inhaler, Inhale 2 puffs into the lungs every 4 (four) hours as needed., Disp: , Rfl:    Docusate Sodium (DSS) 100 MG CAPS, Colace, Disp: , Rfl:    ELDERBERRY PO, Take by mouth., Disp: , Rfl:    Galcanezumab-gnlm (EMGALITY) 120 MG/ML SOAJ, Inject 120 mg into the skin every 30 (thirty) days., Disp: 1 mL, Rfl: 11   Levonorgestrel-Eth Estradiol (TWIRLA) 120-30 MCG/24HR PTWK, Apply 1 patch onto skin once weekly as directed for 21 days, Disp: 3 patch, Rfl: 3   meloxicam (MOBIC) 15 MG tablet, Take 1 tablet (15 mg total) by mouth daily. Take with food, Disp: 14 tablet, Rfl: 0   Multiple Vitamin (MULTIVITAMIN PO), Take by mouth., Disp: , Rfl:     naratriptan (AMERGE) 2.5 MG tablet, TAKE 1 TABLET BY MOUTH AT ONSET OF HEADACHE/MIGRAINE. IF RETURNS OR DOES NOT RESOLVE, MAY REPEAT AFTER 2 HOURS. DO NOT EXCEED 5MG  IN 24 HOURS, Disp: 9 tablet, Rfl: 5   norelgestromin-ethinyl estradiol (ZAFEMY) 150-35 MCG/24HR transdermal patch, Place 1 patch onto the skin once a week., Disp: 3 patch, Rfl: 7   ondansetron (ZOFRAN-ODT) 4 MG disintegrating tablet, Take 1 tablet (4 mg total) by mouth every 8 (eight) hours as needed (for nausea associated with migraines)., Disp: 30 tablet, Rfl: 3   PRESCRIPTION MEDICATION, Take 60 mg by mouth daily. QULIPTA, Disp: , Rfl:    propranolol (INDERAL) 80 MG tablet, Take 1 tablet (80 mg total) by mouth 2 (two) times daily., Disp: 180 tablet, Rfl: 3   Rimegepant Sulfate (NURTEC) 75 MG TBDP, Take 1 tablet (75 mg total) by mouth daily as needed. For migraines. Take as close to onset of migraine as possible. One daily maximum., Disp: 16 tablet, Rfl: 11   Ubrogepant (UBRELVY) 100 MG TABS, Take 1 tablet (100 mg) by mouth every 2 (two) hours as needed. Maximum 2 tablets (200mg ) a day., Disp: 16 tablet, Rfl: 11   valACYclovir (VALTREX) 500 MG tablet, Take 1 tablet (500 mg total) by mouth every 12 (twelve) hours as needed. For three days, Disp: 30 tablet, Rfl: 2   Objective:     Vitals:   03/13/23 1503  BP: 130/82  Pulse: 74  SpO2: 100%  Weight: 140 lb (63.5 kg)  Height: 5\' 5"  (1.651 m)      Body mass index is 23.3 kg/m.    Physical Exam:    General:  awake, alert oriented, no acute distress nontoxic Skin: no suspicious lesions or rashes Neuro:sensation intact and strength 5/5 with no deficits, no atrophy, normal muscle tone Psych: No signs of anxiety, depression or other mood disorder  Left knee: No swelling No deformity Neg fluid wave, joint milking ROM Flex 110, Ext 0 NTTP over the quad tendon, medial fem condyle, lat fem condyle, patella, plica, patella tendon, tibial tuberostiy, fibular head, posterior fossa,  pes anserine bursa, gerdy's tubercle, medial jt line, lateral jt line Neg anterior and posterior drawer Neg lachman Neg sag sign Negative varus stress Negative valgus stress Negative McMurray Negative Thessaly  Gait normal    Electronically signed by:  Aleen Sells D.[facility] 3:12 PM 03/13/23

## 2023-03-11 ENCOUNTER — Encounter: Payer: Self-pay | Admitting: Physical Therapy

## 2023-03-11 ENCOUNTER — Ambulatory Visit: Payer: 59 | Attending: Sports Medicine | Admitting: Physical Therapy

## 2023-03-11 DIAGNOSIS — M6281 Muscle weakness (generalized): Secondary | ICD-10-CM | POA: Diagnosis present

## 2023-03-11 DIAGNOSIS — M25562 Pain in left knee: Secondary | ICD-10-CM | POA: Diagnosis present

## 2023-03-11 NOTE — Therapy (Signed)
OUTPATIENT PHYSICAL THERAPY TREATMENT NOTE   Patient Name: Jasmine Flores MRN: [id number] DOB:Jul 06, 1986, 37 y.o., female Today's Date: 03/11/2023  END OF SESSION:  PT End of Session - 03/11/23 0941     Visit Number 3    Number of Visits 9    Date for PT Re-Evaluation 04/23/23    Authorization Type [employer]    PT Start Time [phone]   pt arrived 12 min late   PT Stop Time 1016    PT Time Calculation (min) 34 min    Activity Tolerance Patient tolerated treatment well;No increased pain    Behavior During Therapy WFL for tasks assessed/performed               Past Medical History:  Diagnosis Date   Alpha trait thalassemia    Anemia    Asthma    Headache    HSV infection    Migraines    Positive TB test    Pregnancy induced hypertension    Vaginal Pap smear, abnormal    Past Surgical History:  Procedure Laterality Date   PITUITARY SURGERY  2008   for adenoma   TONSILECTOMY, ADENOIDECTOMY, BILATERAL MYRINGOTOMY AND TUBES     TONSILLECTOMY     and adenoids   WISDOM TOOTH EXTRACTION     Patient Active Problem List   Diagnosis Date Noted   Left knee pain 12/17/2022   Genital herpes 12/17/2022   Migraine without aura and without status migrainosus, not intractable - Dr Lucia Gaskins 05/19/2022   History of peptic ulcer disease 05/10/2020   Hypertension 06/21/2019   Gastroesophageal reflux disease 04/19/2019   Chronic constipation 03/26/2019   History of gestational hypertension 07/01/2018   Palpitations 06/22/2017   Thyromegaly 06/22/2017   Lung nodules, stable no f/u needed 12/28/2016   Pituitary adenoma (HCC) 09/08/2016   Asthma 09/03/2015    PCP: Pincus Sanes, MD  REFERRING PROVIDER: Richardean Sale, DO  REFERRING DIAG: [phone] (ICD-10-CM) - Chronic pain of left knee  THERAPY DIAG:  Left knee pain, unspecified chronicity  Muscle weakness (generalized)  Rationale for Evaluation and Treatment: Rehabilitation  ONSET DATE: several years,  worsened since earlier this year  SUBJECTIVE:  Per eval - Endorses knee pain since adolescence, 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 States that it resolved after a few days but afterwards would have periodic episodes of swelling/pain that would persist for a day or two but not usually persistent. Had a more recent episode that persisted 2-3 weeks earlier this year which prompted seeking out care. Notes that she tends to have a limp when it is worst. Feels she is pretty much back to baseline since most recent flare. Feels she is guarding her knee more. Has uncomfortable popping at times, feels unstable. Pain and subjective instability w/ stair navigation and prolonged walking.   SUBJECTIVE STATEMENT: 03/11/2023 "I am doing better, right now I feel like my biggest issue is guarding."  PERTINENT HISTORY: HTN, migraine, asthma, GERD, palpitations PAIN:  Are you having pain: none  Location/description: R knee, medial/anterior   Per eval -  Best-worst over past week: 0/10  - aggravating factors: walking, going up stairs  - Easing factors: tylenol, rest    PRECAUTIONS: none  WEIGHT BEARING RESTRICTIONS: No  FALLS:  Has patient fallen in last 6 months? Yes. 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, Advertising copywriter; mostly sedentary   PLOF: Independent  PATIENT GOALS: less pain, be able to participate with kids more  NEXT MD VISIT: June   OBJECTIVE: (objective measures completed at initial evaluation unless otherwise dated)   DIAGNOSTIC FINDINGS:  Unremarkable L knee XR 02/02/23, refer to epic for details  PATIENT SURVEYS:  FOTO 71 current, 78 predicted  COGNITION: Overall cognitive status: Within functional  limits for tasks assessed     SENSATION: NT  EDEMA:  Unable to assess given pt attire - she endorses fluctuating swelling  PALPATION: Concordant tenderness L medial joint line, patellar tendon, adductors, medial quad   LOWER EXTREMITY ROM:     Active  Right eval Left eval  Hip flexion    Hip extension    Hip internal rotation    Hip external rotation    Knee extension 0 0  Knee flexion 138 WNL  (Blank rows = not tested) (Key: WFL = within functional limits not formally assessed, * = concordant pain, s = stiffness/stretching sensation, NT = not tested)  Comments:    LOWER EXTREMITY MMT:    MMT Right eval Left eval  Hip flexion 4+ 4+ *  Hip abduction (modified sitting) 5 5  Hip internal rotation 4+ 4 mild pain  Hip external rotation 4+ 4 *  Knee flexion 5 4+ * (anterior pain)  Knee extension 5 4+ painless   Ankle dorsiflexion     (Blank rows = not tested) (Key: WFL = within functional limits not formally assessed, * = concordant pain, s = stiffness/stretching sensation, NT = not tested)  Comments:    LOWER EXTREMITY SPECIAL TESTS:  Patellar mobility: hypermobile and painless in all directions bilaterally  FUNCTIONAL TESTS:  5xSTS: 9.32sec no UE support    GAIT: Distance walked: within clinic Assistive device utilized: None Level of assistance: Complete Independence Comments: gait mechanics grossly WNL   TODAY'S TREATMENT:                                                                                                                              [facility] Adult PT Treatment:                                                DATE: 03/11/2023 Therapeutic Exercise: Nu-step  L 5 x 5 min LE only  Step downs  2 x 10 LLE from 6 inch step LAQ 2 x 12 with 4# weight with ball squeeze for VMO activation R sidelying hip abduction 2 x 10 with 4# weight 2 x 10  Updated HEP for sidelying hip abduction and LAQ with ball squeeze Manual Therapy: MTPR along  the Vastus lateralis x  2 DTM via tiger tail in sitting Therapeutic Activity: Stair training up/ down 6 inch steps x 10 with verbal cues/ demonstration for descent    [facility] Adult PT Treatment:                                                DATE: 03/03/23 Therapeutic Exercise: SLR 4x5 cues for pacing  Prone hamstring curls BW 2x8 cues for comfortable ROM, emphasis on eccentric control BW clams 3x8 LLE only cues for setup Prone bent knee hip extension x8 cues for improved knee control Prone straight leg hip extension x8 cues for appropriate form/pacing Wall sit 45 deg 2x45sec cues for appropriate weight shift HEP update + education/handout   [facility] Adult PT Treatment:                                                DATE: 02/26/23 Therapeutic Exercise: SLR x5 LLE cues for form and pacing, foot positioning HEP handout + education   PATIENT EDUCATION:  Education details: rationale for interventions, HEP  Person educated: Patient Education method: Explanation, Demonstration, Tactile cues, Verbal cues, and Handouts Education comprehension: verbalized understanding, returned demonstration, verbal cues required, tactile cues required, and needs further education    HOME EXERCISE PROGRAM: Access Code: [email or web address] URL: [email or web address] Date: 03/11/2023 Prepared by: Lulu Riding  Exercises - Supine Active Straight Leg Raise  - 1 x daily - 7 x weekly - 4 sets - 5 reps - Wall Quarter Squat  - 1 x daily - 7 x weekly - 1 sets - 2 reps - 45sec hold - Clamshell  - 1 x daily - 7 x weekly - 3 sets - 8 reps - Sidelying Diagonal Hip Abduction  - 1 x daily - 7 x weekly - 3 sets - 10 reps - Seated Long Arc Quad with Hip Adduction  - 1 x daily - 7 x weekly - 3 sets - 10 reps  ASSESSMENT:  CLINICAL IMPRESSION: 03/11/2023 pt arrives to session reporting no pain today. She did note increased fatigue in her LLE compared bil during Nu-step exercise which upon visual inspection she demonstrates increased valgus  positioning, with verbal cues to keep knee inline with her toes she noted improvement. Continued working on hip abductor activation updating HEP today. Worked on VMO activation and STW along the vastus lateralis she does fatigue quickly with LLE strengthening. She did well with session today noting on pain during or following session.      Per eval - Pt is a pleasant 37 year old woman who arrives to PT evaluation on this date for chronic L knee pain. Pt reports difficulty with stair ascension, prolonged walking, and childcare activities due to pain. During today's session pt demonstrates patellar hypomobility, L hip/knee weakness, and and concordant tenderness of medial/anterior knee musculature, all of which are likely contributory to difficulty with aforementioned activities. Recommend skilled PT to address aforementioned deficits to improve functional independence/tolerance. No adverse events, tolerates exam/HEP well overall. Pt departs today's session in no acute distress, all voiced questions/concerns addressed appropriately from PT perspective.    OBJECTIVE IMPAIRMENTS: Abnormal gait, decreased activity tolerance, decreased endurance, decreased mobility, difficulty walking, decreased ROM, decreased strength, hypomobility, improper  body mechanics, and pain.   ACTIVITY LIMITATIONS: standing, squatting, stairs, transfers, locomotion level, and caring for others  PARTICIPATION LIMITATIONS: meal prep, cleaning, laundry, interpersonal relationship, and community activity  PERSONAL FACTORS: Time since onset of injury/illness/exacerbation and 3+ comorbidities: HTN, migraines, asthma, palpitations  are also affecting patient's functional outcome.   REHAB POTENTIAL: Good  CLINICAL DECISION MAKING: Stable/uncomplicated  EVALUATION COMPLEXITY: Low   GOALS: Goals reviewed with patient? No  SHORT TERM GOALS: Target date: 03/26/2023 Pt will demonstrate appropriate understanding and performance of  initially prescribed HEP in order to facilitate improved independence with management of symptoms.  Baseline: HEP provided on eval Goal status: INITIAL   2. Pt will score greater than or equal to 74 on FOTO in order to demonstrate improved perception of function due to symptoms.  Baseline: 71  Goal status: INITIAL    LONG TERM GOALS: Target date: 04/23/2023 Pt will score 78 or greater on FOTO in order to demonstrate improved perception of function due to symptoms.  Baseline: 71 Goal status: INITIAL  2.  Pt will demonstrate grossly symmetrical hip/knee MMT in order to facilitate improved tolerance to functional movements such as squatting/stairs.  Baseline: see MMT chart above Goal status: INITIAL  3.  Pt will be able to navigate 15 stairs without UE support and less than 2 pt increase in pain in order to improve comfort with home navigation. Baseline: increased pain going up stairs at home Goal status: INITIAL  4.  Pt will be able to perform 5xSTS in less than or equal to 7sec in order to demonstrate reduced fall risk and improved functional independence (MCID of 2.3sec, age cohort norm 6.2+/-1.3sec per Billie Ruddy et al 2007)  Baseline: 9.32sec Goal status: INITIAL   5. Pt will report at least 50% decrease in overall pain levels in past week in order to facilitate improved tolerance to basic ADLs/mobility.   Baseline: 0-4/10  Goal status: INITIAL    6. Pt will demonstrate appropriate performance of final prescribed HEP in order to facilitate improved self-management of symptoms post-discharge.   Baseline: initial HEP prescribed  Goal status: INITIAL     PLAN:  PT FREQUENCY: 1-2x/week  PT DURATION: 8 weeks  PLANNED INTERVENTIONS: Therapeutic exercises, Therapeutic activity, Neuromuscular re-education, Balance training, Gait training, Patient/Family education, Self Care, Joint mobilization, Joint manipulation, Stair training, Aquatic Therapy, Dry Needling, Cryotherapy, Moist heat,  Taping, Manual therapy, and Re-evaluation  PLAN FOR NEXT SESSION: Review/update HEP PRN. Work on [employer] exercises as appropriate with emphasis on knee stability in open and closed chain. Work on hamstrings, adductors, and quads. Symptom modification strategies as indicated/appropriate.     Annelisa Ryback PT, DPT, LAT, ATC  03/11/23  10:43 AM

## 2023-03-13 ENCOUNTER — Ambulatory Visit: Payer: 59 | Admitting: Sports Medicine

## 2023-03-13 VITALS — BP 130/82 | HR 74 | Ht 65.0 in | Wt 140.0 lb

## 2023-03-13 DIAGNOSIS — M25562 Pain in left knee: Secondary | ICD-10-CM

## 2023-03-13 DIAGNOSIS — G8929 Other chronic pain: Secondary | ICD-10-CM

## 2023-03-19 ENCOUNTER — Ambulatory Visit: Payer: 59 | Admitting: Physical Therapy

## 2023-03-19 ENCOUNTER — Other Ambulatory Visit (HOSPITAL_COMMUNITY): Payer: Self-pay

## 2023-03-19 ENCOUNTER — Other Ambulatory Visit: Payer: Self-pay

## 2023-03-19 ENCOUNTER — Encounter: Payer: Self-pay | Admitting: Physical Therapy

## 2023-03-19 DIAGNOSIS — M6281 Muscle weakness (generalized): Secondary | ICD-10-CM

## 2023-03-19 DIAGNOSIS — M25562 Pain in left knee: Secondary | ICD-10-CM

## 2023-03-19 NOTE — Therapy (Signed)
OUTPATIENT PHYSICAL THERAPY TREATMENT NOTE   Patient Name: Jasmine Flores MRN: [id number] DOB:01-06-1986, 37 y.o., female Today's Date: 03/19/2023  END OF SESSION:  PT End of Session - 03/19/23 1325     Visit Number 4    Number of Visits 9    Date for PT Re-Evaluation 04/23/23    Authorization Type [employer]    PT Start Time 1326   late check in   PT Stop Time 1356    PT Time Calculation (min) 30 min    Activity Tolerance Patient tolerated treatment well;No increased pain    Behavior During Therapy WFL for tasks assessed/performed                Past Medical History:  Diagnosis Date   Alpha trait thalassemia    Anemia    Asthma    Headache    HSV infection    Migraines    Positive TB test    Pregnancy induced hypertension    Vaginal Pap smear, abnormal    Past Surgical History:  Procedure Laterality Date   PITUITARY SURGERY  2008   for adenoma   TONSILECTOMY, ADENOIDECTOMY, BILATERAL MYRINGOTOMY AND TUBES     TONSILLECTOMY     and adenoids   WISDOM TOOTH EXTRACTION     Patient Active Problem List   Diagnosis Date Noted   Left knee pain 12/17/2022   Genital herpes 12/17/2022   Migraine without aura and without status migrainosus, not intractable - Dr Lucia Gaskins 05/19/2022   History of peptic ulcer disease 05/10/2020   Hypertension 06/21/2019   Gastroesophageal reflux disease 04/19/2019   Chronic constipation 03/26/2019   History of gestational hypertension 07/01/2018   Palpitations 06/22/2017   Thyromegaly 06/22/2017   Lung nodules, stable no f/u needed 12/28/2016   Pituitary adenoma (HCC) 09/08/2016   Asthma 09/03/2015    PCP: Pincus Sanes, MD  REFERRING PROVIDER: Richardean Sale, DO  REFERRING DIAG: [phone] (ICD-10-CM) - Chronic pain of left knee  THERAPY DIAG:  Left knee pain, unspecified chronicity  Muscle weakness (generalized)  Rationale for Evaluation and Treatment: Rehabilitation  ONSET DATE: several years, worsened  since earlier this year  SUBJECTIVE:  Per eval - Endorses knee pain since adolescence, 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 States that it resolved after a few days but afterwards would have periodic episodes of swelling/pain that would persist for a day or two but not usually persistent. Had a more recent episode that persisted 2-3 weeks earlier this year which prompted seeking out care. Notes that she tends to have a limp when it is worst. Feels she is pretty much back to baseline since most recent flare. Feels she is guarding her knee more. Has uncomfortable popping at times, feels unstable. Pain and subjective instability w/ stair navigation and prolonged walking.   SUBJECTIVE STATEMENT: 03/19/2023 Pt states she had mild increase in pain after last session but resolved by next day. No pain at present. HEP going well, getting easier.   PERTINENT HISTORY: HTN, migraine, asthma, GERD, palpitations PAIN:  Are you having pain: none  Location/description: R knee, medial/anterior   Per eval -  Best-worst over past week: 0/10  - aggravating factors: walking, going up stairs  - Easing factors: tylenol, rest    PRECAUTIONS: none  WEIGHT BEARING RESTRICTIONS: No  FALLS:  Has patient fallen in last 6 months? Yes. 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, Advertising copywriter; mostly sedentary   PLOF: Independent  PATIENT GOALS: less pain, be able to participate with kids more  NEXT MD VISIT: June   OBJECTIVE: (objective measures completed at initial evaluation unless otherwise dated)   DIAGNOSTIC FINDINGS:  Unremarkable L knee XR 02/02/23, refer to epic for details  PATIENT SURVEYS:  FOTO 71 current, 78  predicted  COGNITION: Overall cognitive status: Within functional limits for tasks assessed     SENSATION: NT  EDEMA:  Unable to assess given pt attire - she endorses fluctuating swelling  PALPATION: Concordant tenderness L medial joint line, patellar tendon, adductors, medial quad   LOWER EXTREMITY ROM:     Active  Right eval Left eval  Hip flexion    Hip extension    Hip internal rotation    Hip external rotation    Knee extension 0 0  Knee flexion 138 WNL  (Blank rows = not tested) (Key: WFL = within functional limits not formally assessed, * = concordant pain, s = stiffness/stretching sensation, NT = not tested)  Comments:    LOWER EXTREMITY MMT:    MMT Right eval Left eval  Hip flexion 4+ 4+ *  Hip abduction (modified sitting) 5 5  Hip internal rotation 4+ 4 mild pain  Hip external rotation 4+ 4 *  Knee flexion 5 4+ * (anterior pain)  Knee extension 5 4+ painless   Ankle dorsiflexion     (Blank rows = not tested) (Key: WFL = within functional limits not formally assessed, * = concordant pain, s = stiffness/stretching sensation, NT = not tested)  Comments:    LOWER EXTREMITY SPECIAL TESTS:  Patellar mobility: hypermobile and painless in all directions bilaterally  FUNCTIONAL TESTS:  5xSTS: 9.32sec no UE support    GAIT: Distance walked: within clinic Assistive device utilized: None Level of assistance: Complete Independence Comments: gait mechanics grossly WNL   TODAY'S TREATMENT:                                                                                                                              [facility] Adult PT Treatment:                                                DATE: 03/19/23 Therapeutic Exercise: 1 llap slow 1 lap fast farmer carry 5# BIL 2 bouts cues for pacing 4 inch runner step up LLE leading 2x12 cues for improved stance hip ext 4 inch step up+over+back with UE support 2x8 cues for pacing and form  6 inch step up RTB TKE 2x10 LLE  cues for control Cybex hip extension 12.5# 2x10 BIL cues for setup  [facility] Adult PT Treatment:                                                DATE: 03/11/2023 Therapeutic Exercise: Nu-step  L 5 x 5 min LE only  Step downs  2 x 10 LLE from 6 inch step LAQ 2 x 12 with 4# weight with ball squeeze for VMO activation R sidelying hip abduction 2 x 10 with 4# weight 2 x 10  Updated HEP for sidelying hip abduction and LAQ with ball squeeze Manual Therapy: MTPR along the Vastus lateralis x 2 DTM via tiger tail in sitting Therapeutic Activity: Stair training up/ down 6 inch steps x 10 with verbal cues/ demonstration for descent    [facility] Adult PT Treatment:                                                DATE: 03/03/23 Therapeutic Exercise: SLR 4x5 cues for pacing  Prone hamstring curls BW 2x8 cues for comfortable ROM, emphasis on eccentric control BW clams 3x8 LLE only cues for setup Prone bent knee hip extension x8 cues for improved knee control Prone straight leg hip extension x8 cues for appropriate form/pacing Wall sit 45 deg 2x45sec cues for appropriate weight shift HEP update + education/handout   [facility] Adult PT Treatment:                                                DATE: 02/26/23 Therapeutic Exercise: SLR x5 LLE cues for form and pacing, foot positioning HEP handout + education   PATIENT EDUCATION:  Education details: rationale for interventions, HEP  Person educated: Patient Education method: Explanation, Demonstration, Tactile cues, Verbal cues, and Handouts Education comprehension: verbalized understanding, returned demonstration, verbal cues required, tactile cues required, and needs further education    HOME EXERCISE PROGRAM: Access Code: [email or web address] URL: [email or web address] Date: 03/11/2023 Prepared by: Lulu Riding  Exercises - Supine Active Straight Leg Raise  - 1 x daily - 7 x weekly - 4 sets - 5 reps - Wall Quarter Squat  - 1 x daily - 7 x weekly -  1 sets - 2 reps - 45sec hold - Clamshell  - 1 x daily - 7 x weekly - 3 sets - 8 reps - Sidelying Diagonal Hip Abduction  - 1 x daily - 7 x weekly - 3 sets - 10 reps - Seated Long Arc Quad with Hip Adduction  - 1 x daily - 7 x weekly - 3 sets - 10 reps  ASSESSMENT:  CLINICAL IMPRESSION: 03/19/2023 Pt arrives w/o pain, overall feeling good. Today progressing for increased functional strengthening with emphasis on quad control/endurance. Pt tolerates quite well, cues as above, primary report of muscular fatigue, no increases in pain. Recommend continuing along current POC in order to address relevant deficits and improve functional tolerance. Pt departs today's session in no acute distress, all voiced questions/concerns addressed appropriately from PT perspective.     Per eval - Pt is a pleasant 37 year old woman  who arrives to PT evaluation on this date for chronic L knee pain. Pt reports difficulty with stair ascension, prolonged walking, and childcare activities due to pain. During today's session pt demonstrates patellar hypomobility, L hip/knee weakness, and and concordant tenderness of medial/anterior knee musculature, all of which are likely contributory to difficulty with aforementioned activities. Recommend skilled PT to address aforementioned deficits to improve functional independence/tolerance. No adverse events, tolerates exam/HEP well overall. Pt departs today's session in no acute distress, all voiced questions/concerns addressed appropriately from PT perspective.    OBJECTIVE IMPAIRMENTS: Abnormal gait, decreased activity tolerance, decreased endurance, decreased mobility, difficulty walking, decreased ROM, decreased strength, hypomobility, improper body mechanics, and pain.   ACTIVITY LIMITATIONS: standing, squatting, stairs, transfers, locomotion level, and caring for others  PARTICIPATION LIMITATIONS: meal prep, cleaning, laundry, interpersonal relationship, and community  activity  PERSONAL FACTORS: Time since onset of injury/illness/exacerbation and 3+ comorbidities: HTN, migraines, asthma, palpitations  are also affecting patient's functional outcome.   REHAB POTENTIAL: Good  CLINICAL DECISION MAKING: Stable/uncomplicated  EVALUATION COMPLEXITY: Low   GOALS: Goals reviewed with patient? No  SHORT TERM GOALS: Target date: 03/26/2023 Pt will demonstrate appropriate understanding and performance of initially prescribed HEP in order to facilitate improved independence with management of symptoms.  Baseline: HEP provided on eval Goal status: INITIAL   2. Pt will score greater than or equal to 74 on FOTO in order to demonstrate improved perception of function due to symptoms.  Baseline: 71  Goal status: INITIAL    LONG TERM GOALS: Target date: 04/23/2023 Pt will score 78 or greater on FOTO in order to demonstrate improved perception of function due to symptoms.  Baseline: 71 Goal status: INITIAL  2.  Pt will demonstrate grossly symmetrical hip/knee MMT in order to facilitate improved tolerance to functional movements such as squatting/stairs.  Baseline: see MMT chart above Goal status: INITIAL  3.  Pt will be able to navigate 15 stairs without UE support and less than 2 pt increase in pain in order to improve comfort with home navigation. Baseline: increased pain going up stairs at home Goal status: INITIAL  4.  Pt will be able to perform 5xSTS in less than or equal to 7sec in order to demonstrate reduced fall risk and improved functional independence (MCID of 2.3sec, age cohort norm 6.2+/-1.3sec per Billie Ruddy et al 2007)  Baseline: 9.32sec Goal status: INITIAL   5. Pt will report at least 50% decrease in overall pain levels in past week in order to facilitate improved tolerance to basic ADLs/mobility.   Baseline: 0-4/10  Goal status: INITIAL    6. Pt will demonstrate appropriate performance of final prescribed HEP in order to facilitate improved  self-management of symptoms post-discharge.   Baseline: initial HEP prescribed  Goal status: INITIAL     PLAN:  PT FREQUENCY: 1-2x/week  PT DURATION: 8 weeks  PLANNED INTERVENTIONS: Therapeutic exercises, Therapeutic activity, Neuromuscular re-education, Balance training, Gait training, Patient/Family education, Self Care, Joint mobilization, Joint manipulation, Stair training, Aquatic Therapy, Dry Needling, Cryotherapy, Moist heat, Taping, Manual therapy, and Re-evaluation  PLAN FOR NEXT SESSION: Review/update HEP PRN. Work on [employer] exercises as appropriate with emphasis on knee stability in open and closed chain. Work on hamstrings, adductors, and quads. Symptom modification strategies as indicated/appropriate.       Ashley Murrain PT, DPT 03/19/2023 1:58 PM

## 2023-03-20 ENCOUNTER — Other Ambulatory Visit (HOSPITAL_COMMUNITY): Payer: Self-pay

## 2023-03-24 NOTE — Therapy (Addendum)
OUTPATIENT PHYSICAL THERAPY TREATMENT NOTE + NO VISIT DISCHARGE SUMMARY (see below)    Patient Name: Jasmine Flores MRN: [id number] DOB:[birth year], 38 y.o., female Today's Date: 03/26/2023  END OF SESSION:  PT End of Session - 03/26/23 0805     Visit Number 5    Number of Visits 9    Date for PT Re-Evaluation 04/23/23    Authorization Type [employer]    PT Start Time 0800    PT Stop Time 0843    PT Time Calculation (min) 43 min    Activity Tolerance Patient tolerated treatment well;No increased pain    Behavior During Therapy WFL for tasks assessed/performed                 Past Medical History:  Diagnosis Date   Alpha trait thalassemia    Anemia    Asthma    Headache    HSV infection    Migraines    Positive TB test    Pregnancy induced hypertension    Vaginal Pap smear, abnormal    Past Surgical History:  Procedure Laterality Date   PITUITARY SURGERY  2008   for adenoma   TONSILECTOMY, ADENOIDECTOMY, BILATERAL MYRINGOTOMY AND TUBES     TONSILLECTOMY     and adenoids   WISDOM TOOTH EXTRACTION     Patient Active Problem List   Diagnosis Date Noted   Left knee pain 12/17/2022   Genital herpes 12/17/2022   Migraine without aura and without status migrainosus, not intractable - Dr Lucia Gaskins 05/19/2022   History of peptic ulcer disease 05/10/2020   Hypertension 06/21/2019   Gastroesophageal reflux disease 04/19/2019   Chronic constipation 03/26/2019   History of gestational hypertension 07/01/2018   Palpitations 06/22/2017   Thyromegaly 06/22/2017   Lung nodules, stable no f/u needed 12/28/2016   Pituitary adenoma (HCC) 09/08/2016   Asthma 09/03/2015    PCP: Pincus Sanes, MD  REFERRING PROVIDER: Richardean Sale, DO  REFERRING DIAG: [phone] (ICD-10-CM) - Chronic pain of left knee  THERAPY DIAG:  Left knee pain, unspecified chronicity  Muscle weakness (generalized)  Rationale for Evaluation and Treatment: Rehabilitation  ONSET  DATE: several years, worsened since earlier this year  SUBJECTIVE:  Per eval - Endorses knee pain since adolescence, 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 States that it resolved after a few days but afterwards would have periodic episodes of swelling/pain that would persist for a day or two but not usually persistent. Had a more recent episode that persisted 2-3 weeks earlier this year which prompted seeking out care. Notes that she tends to have a limp when it is worst. Feels she is pretty much back to baseline since most recent flare. Feels she is guarding her knee more. Has uncomfortable popping at times, feels unstable. Pain and subjective instability w/ stair navigation and prolonged walking.   SUBJECTIVE STATEMENT: 03/26/2023 Pt reports no pain today  PERTINENT HISTORY: HTN, migraine, asthma, GERD, palpitations PAIN:  Are you having pain: none  Location/description: R knee, medial/anterior   Per eval -  Best-worst over past week: 0/10  - aggravating factors: walking, going up stairs  - Easing factors: tylenol, rest    PRECAUTIONS: none  WEIGHT BEARING RESTRICTIONS: No  FALLS:  Has patient fallen in last 6 months? Yes. 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, Advertising copywriter; mostly sedentary   PLOF: Independent  PATIENT GOALS: less pain, be able to participate with kids more  NEXT MD VISIT: June   OBJECTIVE: (objective measures completed at initial evaluation unless otherwise dated)   DIAGNOSTIC FINDINGS:  Unremarkable L knee XR 02/02/23, refer to epic for details  PATIENT SURVEYS:  FOTO 71 current, 78 predicted  COGNITION: Overall cognitive status: Within functional limits for tasks  assessed     SENSATION: NT  EDEMA:  Unable to assess given pt attire - she endorses fluctuating swelling  PALPATION: Concordant tenderness L medial joint line, patellar tendon, adductors, medial quad   LOWER EXTREMITY ROM:     Active  Right eval Left eval  Hip flexion    Hip extension    Hip internal rotation    Hip external rotation    Knee extension 0 0  Knee flexion 138 WNL  (Blank rows = not tested) (Key: WFL = within functional limits not formally assessed, * = concordant pain, s = stiffness/stretching sensation, NT = not tested)  Comments:    LOWER EXTREMITY MMT:    MMT Right eval Left eval  Hip flexion 4+ 4+ *  Hip abduction (modified sitting) 5 5  Hip internal rotation 4+ 4 mild pain  Hip external rotation 4+ 4 *  Knee flexion 5 4+ * (anterior pain)  Knee extension 5 4+ painless   Ankle dorsiflexion     (Blank rows = not tested) (Key: WFL = within functional limits not formally assessed, * = concordant pain, s = stiffness/stretching sensation, NT = not tested)  Comments:    LOWER EXTREMITY SPECIAL TESTS:  Patellar mobility: hypermobile and painless in all directions bilaterally  FUNCTIONAL TESTS:  5xSTS: 9.32sec no UE support    GAIT: Distance walked: within clinic Assistive device utilized: None Level of assistance: Complete Independence Comments: gait mechanics grossly WNL   TODAY'S TREATMENT:  [facility] Adult PT Treatment:                                                DATE: 03-26-23 Therapeutic Exercise: 8 inch runner step up LLE  leading 3 x 12  cues for slowing down and improved hip ext in stance 300 ft slow 1 lap fast farmer carry 15# BIL 2 bouts cues for pacing Cybex ext 2 x 12 reps with 25 # Cybex ext 2 x 12 reps with 25 # Kickstand  deadlift L LE forward 15 # KB in L UE 3 x 10 Kickstand  deadlift L LE forward 15 # KB in L UE 2 x 10 Deadlift from 6 inch step with 15 # KB facing backward from 6 inch step 10 x  Deadlift from floor forward  with 15 # KB Step downs  2 x 10 LLE from 6 inch step  [facility] Adult PT Treatment:                                                DATE: 03/19/23 Therapeutic Exercise: 1 llap slow 1 lap fast farmer carry 5# BIL 2 bouts cues for pacing 4 inch runner step up LLE leading 2x12 cues for improved stance hip ext 4 inch step up+over+back with UE support 2x8 cues for pacing and form  6 inch step up RTB TKE 2x10 LLE cues for control Cybex hip extension 12.5# 2x10 BIL cues for setup  [facility] Adult PT Treatment:                                                DATE: 03/11/2023 Therapeutic Exercise: Nu-step  L 5 x 5 min LE only  Step downs  2 x 10 LLE from 6 inch step LAQ 2 x 12 with 4# weight with ball squeeze for VMO activation R sidelying hip abduction 2 x 10 with 4# weight 2 x 10  Updated HEP for sidelying hip abduction and LAQ with ball squeeze Manual Therapy: MTPR along the Vastus lateralis x 2 DTM via tiger tail in sitting Therapeutic Activity: Stair training up/ down 6 inch steps x 10 with verbal cues/ demonstration for descent    [facility] Adult PT Treatment:                                                DATE: 03/03/23 Therapeutic Exercise: SLR 4x5 cues for pacing  Prone hamstring curls BW 2x8 cues for comfortable ROM, emphasis on eccentric control BW clams 3x8 LLE only cues for setup Prone bent knee hip extension x8 cues for improved knee control Prone straight leg hip extension x8 cues for appropriate form/pacing Wall sit 45 deg 2x45sec cues for appropriate weight shift HEP update + education/handout   [facility] Adult PT Treatment:                                                DATE: 02/26/23 Therapeutic Exercise: SLR x5 LLE cues for form and pacing, foot positioning HEP handout + education   PATIENT EDUCATION:  Education details: rationale for interventions, HEP  Person  educated: Patient Education method: Explanation, Demonstration, Tactile cues, Verbal cues, and Handouts Education comprehension: verbalized understanding, returned demonstration, verbal cues required, tactile cues required, and needs further education    HOME EXERCISE PROGRAM: Access Code: [email or web address] URL: [email or web address] Date: 03/11/2023 Prepared by: Lulu Riding  Exercises - Supine Active Straight Leg Raise  - 1 x daily - 7 x weekly - 4 sets - 5 reps - Wall Quarter Squat  - 1 x daily - 7 x weekly - 1 sets - 2 reps - 45sec hold - Clamshell  - 1 x daily - 7 x weekly - 3 sets - 8 reps - Sidelying Diagonal Hip Abduction  - 1 x daily - 7 x weekly - 3 sets - 10 reps - Seated Long  Arc Quad with Hip Adduction  - 1 x daily - 7 x weekly - 3 sets - 10 reps  ASSESSMENT:  CLINICAL IMPRESSION: 03/26/2023 Pt arrives w/o pain, overall feeling good. Pt FOTO 77% and achieved all STG today. Continued exercises progressing functional strengthening with emphasis on quad control/endurance.  Pt also shown deadlifting for hamstring and low back training.  Pt  fatigued at end of session without increased pain. Pt will benefit form continuing maximizing strength.  Pt has tendency to hyperextend knee with fatigue later in session.   Pt departs today's session in no acute distress, all voiced questions/concerns addressed appropriately from PT perspective.     Per eval - Pt is a pleasant 37 year old woman who arrives to PT evaluation on this date for chronic L knee pain. Pt reports difficulty with stair ascension, prolonged walking, and childcare activities due to pain. During today's session pt demonstrates patellar hypomobility, L hip/knee weakness, and and concordant tenderness of medial/anterior knee musculature, all of which are likely contributory to difficulty with aforementioned activities. Recommend skilled PT to address aforementioned deficits to improve functional independence/tolerance.  No adverse events, tolerates exam/HEP well overall. Pt departs today's session in no acute distress, all voiced questions/concerns addressed appropriately from PT perspective.    OBJECTIVE IMPAIRMENTS: Abnormal gait, decreased activity tolerance, decreased endurance, decreased mobility, difficulty walking, decreased ROM, decreased strength, hypomobility, improper body mechanics, and pain.   ACTIVITY LIMITATIONS: standing, squatting, stairs, transfers, locomotion level, and caring for others  PARTICIPATION LIMITATIONS: meal prep, cleaning, laundry, interpersonal relationship, and community activity  PERSONAL FACTORS: Time since onset of injury/illness/exacerbation and 3+ comorbidities: HTN, migraines, asthma, palpitations  are also affecting patient's functional outcome.   REHAB POTENTIAL: Good  CLINICAL DECISION MAKING: Stable/uncomplicated  EVALUATION COMPLEXITY: Low   GOALS: Goals reviewed with patient? No  SHORT TERM GOALS: Target date: 03/26/2023 Pt will demonstrate appropriate understanding and performance of initially prescribed HEP in order to facilitate improved independence with management of symptoms.  Baseline: HEP provided on eval 03-26-23 doing HEP 1 x a day consistently Goal status: MET  2. Pt will score greater than or equal to 74 on FOTO in order to demonstrate improved perception of function due to symptoms.  Baseline: 71 03-26-23  77  Goal status: MET    LONG TERM GOALS: Target date: 04/23/2023 Pt will score 78 or greater on FOTO in order to demonstrate improved perception of function due to symptoms.  Baseline: 71 Goal status: INITIAL  2.  Pt will demonstrate grossly symmetrical hip/knee MMT in order to facilitate improved tolerance to functional movements such as squatting/stairs.  Baseline: see MMT chart above Goal status: INITIAL  3.  Pt will be able to navigate 15 stairs without UE support and less than 2 pt increase in pain in order to improve comfort with  home navigation. Baseline: increased pain going up stairs at home Goal status: INITIAL  4.  Pt will be able to perform 5xSTS in less than or equal to 7sec in order to demonstrate reduced fall risk and improved functional independence (MCID of 2.3sec, age cohort norm 6.2+/-1.3sec per Billie Ruddy et al 2007)  Baseline: 9.32sec Goal status: INITIAL   5. Pt will report at least 50% decrease in overall pain levels in past week in order to facilitate improved tolerance to basic ADLs/mobility.   Baseline: 0-4/10  Goal status: INITIAL    6. Pt will demonstrate appropriate performance of final prescribed HEP in order to facilitate improved self-management of symptoms post-discharge.  Baseline: initial HEP prescribed  Goal status: INITIAL     PLAN:  PT FREQUENCY: 1-2x/week  PT DURATION: 8 weeks  PLANNED INTERVENTIONS: Therapeutic exercises, Therapeutic activity, Neuromuscular re-education, Balance training, Gait training, Patient/Family education, Self Care, Joint mobilization, Joint manipulation, Stair training, Aquatic Therapy, Dry Needling, Cryotherapy, Moist heat, Taping, Manual therapy, and Re-evaluation  PLAN FOR NEXT SESSION: Review/update HEP PRN. Work on [employer] exercises as appropriate with emphasis on knee stability in open and closed chain. Work on hamstrings, adductors, and quads. Symptom modification strategies as indicated/appropriate.      Garen Lah, PT, ATRIC Certified Exercise Expert for the Aging Adult  03/26/23 8:49 AM Phone: [phone] Fax: [phone]     Discharge addendum 06/23/2023  PHYSICAL THERAPY DISCHARGE SUMMARY  Visits from Start of Care: 5  Current functional level related to goals / functional outcomes: Unable to be assessed   Remaining deficits: Unable to be assessed   Education / Equipment: Unable to be assessed  Patient goals were unable to be assessed. Patient is being discharged due to not returning since the last visit.      Ashley Murrain PT, DPT 06/23/2023 10:33 AM

## 2023-03-26 ENCOUNTER — Encounter: Payer: Self-pay | Admitting: Physical Therapy

## 2023-03-26 ENCOUNTER — Ambulatory Visit: Payer: 59 | Admitting: Physical Therapy

## 2023-03-26 DIAGNOSIS — M25562 Pain in left knee: Secondary | ICD-10-CM | POA: Diagnosis not present

## 2023-03-26 DIAGNOSIS — M6281 Muscle weakness (generalized): Secondary | ICD-10-CM

## 2023-04-01 ENCOUNTER — Ambulatory Visit: Payer: 59 | Admitting: Physical Therapy

## 2023-04-03 ENCOUNTER — Other Ambulatory Visit (HOSPITAL_COMMUNITY): Payer: Self-pay

## 2023-04-07 ENCOUNTER — Ambulatory Visit: Payer: 59 | Admitting: Physical Therapy

## 2023-04-07 ENCOUNTER — Telehealth: Payer: Self-pay

## 2023-04-07 ENCOUNTER — Other Ambulatory Visit (HOSPITAL_COMMUNITY): Payer: Self-pay

## 2023-04-07 NOTE — Telephone Encounter (Signed)
Patient Advocate Encounter   Received notification from [employer] that prior authorization is required for Emgality 120MG /ML syringes (migraine)   Submitted: 04-07-2023 Key [id number]  Status is pending

## 2023-04-14 ENCOUNTER — Ambulatory Visit: Payer: 59 | Admitting: Physical Therapy

## 2023-04-14 NOTE — Telephone Encounter (Signed)
Pharmacy Patient Advocate Encounter  Prior Authorization for Emgality 120MG /ML syringes (migraine) has been APPROVED by [employer] from 04/07/2023 to 04/06/2024.   PA # [id number]

## 2023-04-23 ENCOUNTER — Other Ambulatory Visit (HOSPITAL_COMMUNITY): Payer: Self-pay

## 2023-04-23 MED ORDER — NORELGESTROMIN-ETH ESTRADIOL 150-35 MCG/24HR TD PTWK
1.0000 | MEDICATED_PATCH | TRANSDERMAL | 11 refills | Status: DC
  Filled 2023-04-23: qty 3, 21d supply, fill #0

## 2023-04-23 MED ORDER — NORELGESTROMIN-ETH ESTRADIOL 150-35 MCG/24HR TD PTWK
MEDICATED_PATCH | TRANSDERMAL | 7 refills | Status: DC
  Filled 2023-04-23: qty 3, 21d supply, fill #0
  Filled 2023-06-01: qty 3, 21d supply, fill #1
  Filled 2023-07-03: qty 3, 21d supply, fill #2
  Filled 2023-07-30: qty 3, 21d supply, fill #3
  Filled 2023-09-10: qty 3, 21d supply, fill #4
  Filled 2023-09-29: qty 3, 21d supply, fill #5
  Filled 2023-11-02: qty 3, 21d supply, fill #6
  Filled 2023-12-02: qty 3, 21d supply, fill #7

## 2023-04-24 ENCOUNTER — Other Ambulatory Visit (HOSPITAL_COMMUNITY): Payer: Self-pay

## 2023-04-26 ENCOUNTER — Telehealth: Payer: Self-pay

## 2023-04-26 NOTE — Telephone Encounter (Addendum)
Received a PA request via CMM for Ubrelvy. I can see in chart that Ubrelvy and Nurtec are on her active med lists but it looks like per her last OV PT was instructed to take both-Insurance will ask about taking two of those meds in the same class-Please advise

## 2023-05-08 NOTE — Telephone Encounter (Signed)
Looks like the Nurtec was prescribed to try instead of Ubrelvy.  I sent patient a MyChart message to see if she is having success with the Nurtec.

## 2023-06-02 ENCOUNTER — Other Ambulatory Visit (HOSPITAL_COMMUNITY): Payer: Self-pay

## 2023-06-03 ENCOUNTER — Other Ambulatory Visit (HOSPITAL_COMMUNITY): Payer: Self-pay

## 2023-06-03 ENCOUNTER — Other Ambulatory Visit: Payer: Self-pay

## 2023-06-05 ENCOUNTER — Other Ambulatory Visit (HOSPITAL_COMMUNITY): Payer: Self-pay

## 2023-06-05 ENCOUNTER — Other Ambulatory Visit: Payer: Self-pay | Admitting: Neurology

## 2023-06-05 DIAGNOSIS — G43709 Chronic migraine without aura, not intractable, without status migrainosus: Secondary | ICD-10-CM

## 2023-06-05 MED ORDER — EMGALITY 120 MG/ML ~~LOC~~ SOAJ
120.0000 mg | SUBCUTANEOUS | 6 refills | Status: DC
Start: 2023-06-05 — End: 2024-02-12
  Filled 2023-06-05: qty 1, 30d supply, fill #0
  Filled 2023-07-10: qty 1, 30d supply, fill #1
  Filled 2023-08-14: qty 1, 30d supply, fill #2
  Filled 2023-09-10: qty 1, 30d supply, fill #3
  Filled 2023-10-20 – 2023-11-02 (×2): qty 1, 30d supply, fill #4
  Filled 2023-12-02: qty 1, 30d supply, fill #5
  Filled 2024-01-04: qty 1, 30d supply, fill #6

## 2023-06-26 ENCOUNTER — Other Ambulatory Visit (HOSPITAL_COMMUNITY): Payer: Self-pay

## 2023-07-10 ENCOUNTER — Other Ambulatory Visit: Payer: Self-pay

## 2023-07-10 ENCOUNTER — Other Ambulatory Visit (HOSPITAL_COMMUNITY): Payer: Self-pay

## 2023-07-13 ENCOUNTER — Other Ambulatory Visit (HOSPITAL_COMMUNITY): Payer: Self-pay

## 2023-07-17 ENCOUNTER — Ambulatory Visit: Payer: 59

## 2023-07-17 DIAGNOSIS — Z23 Encounter for immunization: Secondary | ICD-10-CM

## 2023-07-17 NOTE — Progress Notes (Signed)
Patient presented in office today for her Flu Vaccine. Regular dose Flu Vaccine was administered into her Right Deltoid Muscle. Patient tolerated injection well and the injection site looked well. Patient advised to report to the office immediately if she notices any adverse reactions.

## 2023-07-23 ENCOUNTER — Telehealth: Payer: Self-pay

## 2023-07-23 NOTE — Telephone Encounter (Signed)
*  [facility]  Pharmacy Patient Advocate Encounter   Received notification from CoverMyMeds that prior authorization for Nurtec 75MG  dispersible tablets  is required/requested.   Insurance verification completed.   The patient is insured through [facility] .   Per test claim: PA required; PA submitted to [facility] via CoverMyMeds Key/confirmation #/EOC [id number] Status is pending

## 2023-07-27 ENCOUNTER — Other Ambulatory Visit (HOSPITAL_COMMUNITY): Payer: Self-pay

## 2023-07-31 ENCOUNTER — Other Ambulatory Visit (HOSPITAL_COMMUNITY): Payer: Self-pay

## 2023-07-31 NOTE — Telephone Encounter (Signed)
Pharmacy Patient Advocate Encounter  Received notification from [facility] that Prior Authorization for Nurtec 75mg  has been APPROVED from 07/23/2023 to 07/22/2024. Ran test claim, Copay is $refill too soon. This test claim was processed through [facility]- copay amounts may vary at other pharmacies due to pharmacy/plan contracts, or as the patient moves through the different stages of their insurance plan.

## 2023-09-10 ENCOUNTER — Other Ambulatory Visit: Payer: Self-pay

## 2023-09-10 ENCOUNTER — Other Ambulatory Visit: Payer: Self-pay | Admitting: Internal Medicine

## 2023-09-10 ENCOUNTER — Other Ambulatory Visit (HOSPITAL_COMMUNITY): Payer: Self-pay

## 2023-09-10 MED ORDER — VALACYCLOVIR HCL 500 MG PO TABS
500.0000 mg | ORAL_TABLET | Freq: Two times a day (BID) | ORAL | 2 refills | Status: DC | PRN
  Filled 2023-09-10: qty 30, 15d supply, fill #0
  Filled 2023-11-12: qty 30, 15d supply, fill #1
  Filled 2023-12-31: qty 30, 15d supply, fill #2

## 2023-09-14 ENCOUNTER — Other Ambulatory Visit (HOSPITAL_COMMUNITY): Payer: Self-pay

## 2023-10-20 ENCOUNTER — Other Ambulatory Visit (HOSPITAL_COMMUNITY): Payer: Self-pay

## 2023-10-21 ENCOUNTER — Telehealth: Payer: Self-pay | Admitting: Neurology

## 2023-10-21 ENCOUNTER — Other Ambulatory Visit (HOSPITAL_COMMUNITY): Payer: Self-pay

## 2023-10-21 NOTE — Telephone Encounter (Signed)
 Pt called back stating that she is being charged $35 for her Emgality  and she is thinking that it is needing a new PA. She would like for this to be checked on for her. Please advise .

## 2023-10-21 NOTE — Telephone Encounter (Signed)
 I called pt.  She says she cannot afford $35 copay for her medication.  She has used the copay savings card and been on the emgality  fro like 2 yrs.  This has helped.  I told her that due to insurance, preferred lists change, if it required PA they would not have given her medication.  She has appt 01/2024 to discuss med management.  We can try tier reduction (not sure if option but can ask).  Pt was ok with this.  She has acute meds that help.

## 2023-10-23 ENCOUNTER — Other Ambulatory Visit (HOSPITAL_COMMUNITY): Payer: Self-pay

## 2023-10-29 ENCOUNTER — Other Ambulatory Visit (HOSPITAL_COMMUNITY): Payer: Self-pay

## 2023-10-29 ENCOUNTER — Telehealth: Payer: Self-pay

## 2023-10-29 NOTE — Telephone Encounter (Signed)
I was asked to call insurance to request a tier exception-they stated after looking into the account and plan and the medication that a tier exception would not be allowed.

## 2023-10-29 NOTE — Telephone Encounter (Signed)
Noted  

## 2023-11-03 ENCOUNTER — Other Ambulatory Visit (HOSPITAL_COMMUNITY): Payer: Self-pay

## 2023-11-03 ENCOUNTER — Other Ambulatory Visit: Payer: Self-pay

## 2023-11-13 ENCOUNTER — Other Ambulatory Visit: Payer: Self-pay

## 2023-12-02 ENCOUNTER — Other Ambulatory Visit (HOSPITAL_COMMUNITY): Payer: Self-pay

## 2023-12-02 ENCOUNTER — Other Ambulatory Visit: Payer: Self-pay

## 2023-12-23 ENCOUNTER — Other Ambulatory Visit (HOSPITAL_COMMUNITY): Payer: Self-pay

## 2024-01-07 ENCOUNTER — Telehealth: Payer: Self-pay

## 2024-01-07 ENCOUNTER — Telehealth: Payer: 59 | Admitting: Adult Health

## 2024-01-07 NOTE — Telephone Encounter (Signed)
 Please see mychart message.

## 2024-01-07 NOTE — Telephone Encounter (Signed)
 LVM for pt to either call us back if she is having trouble with her mychart, or to turn her camera on.

## 2024-01-19 ENCOUNTER — Telehealth: Payer: 59 | Admitting: Adult Health

## 2024-02-11 ENCOUNTER — Encounter: Payer: Self-pay | Admitting: Internal Medicine

## 2024-02-11 NOTE — Patient Instructions (Addendum)
 Pneumonia vaccine given    Blood work was ordered.       Medications changes include :   valsartan 40 mg daily     Return in about 6 months (around 08/14/2024) for follow up.    Health Maintenance, Female Adopting a healthy lifestyle and getting preventive care are important in promoting health and wellness. Ask your health care provider about: The right schedule for you to have regular tests and exams. Things you can do on your own to prevent diseases and keep yourself healthy. What should I know about diet, weight, and exercise? Eat a healthy diet  Eat a diet that includes plenty of vegetables, fruits, low-fat dairy products, and lean protein. Do not eat a lot of foods that are high in solid fats, added sugars, or sodium. Maintain a healthy weight Body mass index (BMI) is used to identify weight problems. It estimates body fat based on height and weight. Your health care provider can help determine your BMI and help you achieve or maintain a healthy weight. Get regular exercise Get regular exercise. This is one of the most important things you can do for your health. Most adults should: Exercise for at least 150 minutes each week. The exercise should increase your heart rate and make you sweat (moderate-intensity exercise). Do strengthening exercises at least twice a week. This is in addition to the moderate-intensity exercise. Spend less time sitting. Even light physical activity can be beneficial. Watch cholesterol and blood lipids Have your blood tested for lipids and cholesterol at 40 years of age, then have this test every 5 years. Have your cholesterol levels checked more often if: Your lipid or cholesterol levels are high. You are older than 40 years of age. You are at high risk for heart disease. What should I know about cancer screening? Depending on your health history and family history, you may need to have cancer screening at various ages. This may include  screening for: Breast cancer. Cervical cancer. Colorectal cancer. Skin cancer. Lung cancer. What should I know about heart disease, diabetes, and high blood pressure? Blood pressure and heart disease High blood pressure causes heart disease and increases the risk of stroke. This is more likely to develop in people who have high blood pressure readings or are overweight. Have your blood pressure checked: Every 3-5 years if you are 40-66 years of age. Every year if you are 40 years old or older. Diabetes Have regular diabetes screenings. This checks your fasting blood sugar level. Have the screening done: Once every three years after age 65 if you are at a normal weight and have a low risk for diabetes. More often and at a younger age if you are overweight or have a high risk for diabetes. What should I know about preventing infection? Hepatitis B If you have a higher risk for hepatitis B, you should be screened for this virus. Talk with your health care provider to find out if you are at risk for hepatitis B infection. Hepatitis C Testing is recommended for: Everyone born from [birth year] through 1967. Anyone with known risk factors for hepatitis C. Sexually transmitted infections (STIs) Get screened for STIs, including gonorrhea and chlamydia, if: You are sexually active and are younger than 40 years of age. You are older than 40 years of age and your health care provider tells you that you are at risk for this type of infection. Your sexual activity has changed since you were last screened, and you are  at increased risk for chlamydia or gonorrhea. Ask your health care provider if you are at risk. Ask your health care provider about whether you are at high risk for HIV. Your health care provider may recommend a prescription medicine to help prevent HIV infection. If you choose to take medicine to prevent HIV, you should first get tested for HIV. You should then be tested every 3 months for as  long as you are taking the medicine. Pregnancy If you are about to stop having your period (premenopausal) and you may become pregnant, seek counseling before you get pregnant. Take 400 to 800 micrograms (mcg) of folic acid every day if you become pregnant. Ask for birth control (contraception) if you want to prevent pregnancy. Osteoporosis and menopause Osteoporosis is a disease in which the bones lose minerals and strength with aging. This can result in bone fractures. If you are 40 years old or older, or if you are at risk for osteoporosis and fractures, ask your health care provider if you should: Be screened for bone loss. Take a calcium or vitamin D supplement to lower your risk of fractures. Be given hormone replacement therapy (HRT) to treat symptoms of menopause. Follow these instructions at home: Alcohol use Do not drink alcohol if: Your health care provider tells you not to drink. You are pregnant, may be pregnant, or are planning to become pregnant. If you drink alcohol: Limit how much you have to: 0-1 drink a day. Know how much alcohol is in your drink. In the [place] one drink equals one 12 oz bottle of beer (355 mL), one 5 oz glass of wine (148 mL), or one 1 oz glass of hard liquor (44 mL). Lifestyle Do not use any products that contain nicotine or tobacco. These products include cigarettes, chewing tobacco, and vaping devices, such as e-cigarettes. If you need help quitting, ask your health care provider. Do not use street drugs. Do not share needles. Ask your health care provider for help if you need support or information about quitting drugs. General instructions Schedule regular health, dental, and eye exams. Stay current with your vaccines. Tell your health care provider if: You often feel depressed. You have ever been abused or do not feel safe at home. Summary Adopting a healthy lifestyle and getting preventive care are important in promoting health and  wellness. Follow your health care provider's instructions about healthy diet, exercising, and getting tested or screened for diseases. Follow your health care provider's instructions on monitoring your cholesterol and blood pressure. This information is not intended to replace advice given to you by your health care provider. Make sure you discuss any questions you have with your health care provider. Document Revised: 02/11/2021 Document Reviewed: 02/11/2021 Elsevier Patient Education  2024 [employer].

## 2024-02-11 NOTE — Progress Notes (Signed)
 Subjective:    Patient ID: Jasmine Flores, female    DOB: [birth year], 38 y.o.   MRN: [id number]      HPI Jasmine Flores is here for a Physical exam and her chronic medical problems.   BP at home 120-136/70-80's.  Went to a fair recently and it was 160's/107   Episodes - 3 - pulsating in ear - hear heart beat ( right or left side of head) then it went up into head.  No pain or headache.  It is uncomfortable.  No palpitations,  lightheadedness/dizziness.   She did check her BP and it was normal.  She will lay down.  May last 1-2 hrs (2 episodes) - the last episode lasted all day.  The last episode she knows she has was fairly fatigued when it occurred.   Medications and allergies reviewed with patient and updated if appropriate.  Current Outpatient Medications on File Prior to Visit  Medication Sig Dispense Refill   albuterol  (VENTOLIN  HFA) 108 (90 Base) MCG/ACT inhaler Inhale 2 puffs into the lungs every 4 (four) hours as needed.     Docusate Sodium  (DSS) 100 MG CAPS Colace     ELDERBERRY PO Take by mouth.     Galcanezumab -gnlm (EMGALITY ) 120 MG/ML SOAJ Inject 120 mg into the skin every 30 (thirty) days. 1 mL 6   Multiple Vitamin (MULTIVITAMIN PO) Take by mouth.     ondansetron  (ZOFRAN -ODT) 4 MG disintegrating tablet Take 1 tablet (4 mg total) by mouth every 8 (eight) hours as needed (for nausea associated with migraines). 30 tablet 3   PRESCRIPTION MEDICATION Take 60 mg by mouth daily. QULIPTA     propranolol  (INDERAL ) 80 MG tablet Take 1 tablet (80 mg total) by mouth 2 (two) times daily. 180 tablet 3   Rimegepant Sulfate  (NURTEC) 75 MG TBDP Take 1 tablet (75 mg total) by mouth daily as needed for migraines. Take as close to onset of migraine as possible. (One daily maximum). 16 tablet 11   Ubrogepant  (UBRELVY ) 100 MG TABS Take 1 tablet (100 mg) by mouth every 2 (two) hours as needed. Maximum 2 tablets (200mg ) a day. 16 tablet 11   valACYclovir  (VALTREX ) 500 MG tablet Take  1 tablet (500 mg total) by mouth every 12 (twelve) hours as needed. For three days 30 tablet 2   naratriptan  (AMERGE) 2.5 MG tablet TAKE 1 TABLET BY MOUTH AT ONSET OF HEADACHE/MIGRAINE. IF RETURNS OR DOES NOT RESOLVE, MAY REPEAT AFTER 2 HOURS. DO NOT EXCEED 5MG  IN 24 HOURS 9 tablet 5   No current facility-administered medications on file prior to visit.    Review of Systems  Constitutional:  Negative for fever.  Eyes:  Negative for visual disturbance.  Respiratory:  Negative for cough, shortness of breath and wheezing.   Cardiovascular:  Negative for chest pain, palpitations and leg swelling.       Occ palpitations  Gastrointestinal:  Positive for constipation. Negative for abdominal pain, blood in stool and diarrhea.       No gerd  Genitourinary:  Negative for dysuria.  Musculoskeletal:  Positive for arthralgias (mild). Negative for back pain.  Skin:  Negative for rash.  Neurological:  Positive for headaches (migraines). Negative for dizziness and light-headedness.  Psychiatric/Behavioral:  Negative for dysphoric mood. The patient is nervous/anxious.        Objective:   Vitals:   02/12/24 0801  BP: (!) 140/72  Pulse: 73  Temp: 97.9 F (36.6 C)  SpO2: 100%  Filed Weights   02/12/24 0801  Weight: 147 lb (66.7 kg)   Body mass index is 24.46 kg/m.  BP Readings from Last 3 Encounters:  02/12/24 (!) 140/72  03/13/23 130/82  01/26/23 110/76    Wt Readings from Last 3 Encounters:  02/12/24 147 lb (66.7 kg)  03/13/23 140 lb (63.5 kg)  01/30/23 144 lb (65.3 kg)       Physical Exam Constitutional: She appears well-developed and well-nourished. No distress.  HENT:  Head: Normocephalic and atraumatic.  Right Ear: External ear normal. Normal ear canal and TM Left Ear: External ear normal.  Normal ear canal and TM Mouth/Throat: Oropharynx is clear and moist.  Eyes: Conjunctivae normal.  Neck: Neck supple. No tracheal deviation present. No thyromegaly present.  No  carotid bruit  Cardiovascular: Normal rate, regular rhythm and normal heart sounds.   No murmur heard.  No edema. Pulmonary/Chest: Effort normal and breath sounds normal. No respiratory distress. She has no wheezes. She has no rales.  Breast: deferred   Abdominal: Soft. She exhibits no distension. There is no tenderness.  Lymphadenopathy: She has no cervical adenopathy.  Skin: Skin is warm and dry. She is not diaphoretic.  Psychiatric: She has a normal mood and affect. Her behavior is normal.     Lab Results  Component Value Date   WBC 5.6 01/26/2023   HGB 11.9 (L) 01/26/2023   HCT 36.3 01/26/2023   PLT 435.0 (H) 01/26/2023   GLUCOSE 100 (H) 01/26/2023   CHOL 125 01/26/2023   TRIG 115.0 01/26/2023   HDL 58.70 01/26/2023   LDLCALC 43 01/26/2023   ALT 14 01/26/2023   AST 16 01/26/2023   NA 137 01/26/2023   K 4.0 01/26/2023   CL 101 01/26/2023   CREATININE 0.62 01/26/2023   BUN 15 01/26/2023   CO2 30 01/26/2023   TSH 1.13 01/26/2023         Assessment & Plan:   Physical exam: Screening blood work  ordered Exercise  at least 3 times a week Weight  normal Substance abuse  none   Reviewed recommended immunizations.  Pneumo 13 today   Health Maintenance  Topic Date Due   Pneumococcal Vaccine 49-40 Years old (2 of 2 - PCV) 09/04/2016   Cervical Cancer Screening (HPV/Pap Cotest)  10/15/2018   COVID-19 Vaccine (3 - 2024-25 season) 02/27/2024 (Originally 06/07/2023)   INFLUENZA VACCINE  05/06/2024   DTaP/Tdap/Td (3 - Td or Tdap) 06/25/2028   Hepatitis C Screening  Completed   HIV Screening  Completed   HPV VACCINES  Aged Out   Meningococcal B Vaccine  Aged Out      Pap smear up-to-date-will request report    See Problem List for Assessment and Plan of chronic medical problems.

## 2024-02-12 ENCOUNTER — Ambulatory Visit (INDEPENDENT_AMBULATORY_CARE_PROVIDER_SITE_OTHER): Admitting: Internal Medicine

## 2024-02-12 ENCOUNTER — Encounter: Payer: Self-pay | Admitting: Internal Medicine

## 2024-02-12 ENCOUNTER — Other Ambulatory Visit: Payer: Self-pay | Admitting: Neurology

## 2024-02-12 ENCOUNTER — Other Ambulatory Visit (HOSPITAL_COMMUNITY): Payer: Self-pay

## 2024-02-12 VITALS — BP 128/74 | HR 73 | Temp 97.9°F | Ht 65.0 in | Wt 147.0 lb

## 2024-02-12 DIAGNOSIS — Z0000 Encounter for general adult medical examination without abnormal findings: Secondary | ICD-10-CM

## 2024-02-12 DIAGNOSIS — G43009 Migraine without aura, not intractable, without status migrainosus: Secondary | ICD-10-CM | POA: Diagnosis not present

## 2024-02-12 DIAGNOSIS — H938X9 Other specified disorders of ear, unspecified ear: Secondary | ICD-10-CM

## 2024-02-12 DIAGNOSIS — Z862 Personal history of diseases of the blood and blood-forming organs and certain disorders involving the immune mechanism: Secondary | ICD-10-CM

## 2024-02-12 DIAGNOSIS — Z23 Encounter for immunization: Secondary | ICD-10-CM

## 2024-02-12 DIAGNOSIS — I10 Essential (primary) hypertension: Secondary | ICD-10-CM

## 2024-02-12 DIAGNOSIS — G43709 Chronic migraine without aura, not intractable, without status migrainosus: Secondary | ICD-10-CM

## 2024-02-12 DIAGNOSIS — Z0001 Encounter for general adult medical examination with abnormal findings: Secondary | ICD-10-CM

## 2024-02-12 DIAGNOSIS — J4520 Mild intermittent asthma, uncomplicated: Secondary | ICD-10-CM | POA: Diagnosis not present

## 2024-02-12 LAB — CBC WITH DIFFERENTIAL/PLATELET
Basophils Absolute: 0 10*3/uL (ref 0.0–0.1)
Basophils Relative: 0.5 % (ref 0.0–3.0)
Eosinophils Absolute: 0.3 10*3/uL (ref 0.0–0.7)
Eosinophils Relative: 5.1 % — ABNORMAL HIGH (ref 0.0–5.0)
HCT: 38.5 % (ref 36.0–46.0)
Hemoglobin: 12.5 g/dL (ref 12.0–15.0)
Lymphocytes Relative: 32.5 % (ref 12.0–46.0)
Lymphs Abs: 1.7 10*3/uL (ref 0.7–4.0)
MCHC: 32.4 g/dL (ref 30.0–36.0)
MCV: 78.3 fl (ref 78.0–100.0)
Monocytes Absolute: 0.5 10*3/uL (ref 0.1–1.0)
Monocytes Relative: 9.9 % (ref 3.0–12.0)
Neutro Abs: 2.7 10*3/uL (ref 1.4–7.7)
Neutrophils Relative %: 52 % (ref 43.0–77.0)
Platelets: 336 10*3/uL (ref 150.0–400.0)
RBC: 4.92 Mil/uL (ref 3.87–5.11)
RDW: 14.5 % (ref 11.5–15.5)
WBC: 5.2 10*3/uL (ref 4.0–10.5)

## 2024-02-12 LAB — IBC PANEL
Iron: 78 ug/dL (ref 42–145)
Saturation Ratios: 21.2 % (ref 20.0–50.0)
TIBC: 368.2 ug/dL (ref 250.0–450.0)
Transferrin: 263 mg/dL (ref 212.0–360.0)

## 2024-02-12 LAB — COMPREHENSIVE METABOLIC PANEL WITH GFR
ALT: 16 U/L (ref 0–35)
AST: 19 U/L (ref 0–37)
Albumin: 4.3 g/dL (ref 3.5–5.2)
Alkaline Phosphatase: 86 U/L (ref 39–117)
BUN: 18 mg/dL (ref 6–23)
CO2: 28 meq/L (ref 19–32)
Calcium: 9.5 mg/dL (ref 8.4–10.5)
Chloride: 101 meq/L (ref 96–112)
Creatinine, Ser: 0.7 mg/dL (ref 0.40–1.20)
GFR: 108.35 mL/min (ref >60.00)
Glucose, Bld: 98 mg/dL (ref 70–99)
Potassium: 4.1 meq/L (ref 3.5–5.1)
Sodium: 138 meq/L (ref 135–145)
Total Bilirubin: 0.7 mg/dL (ref 0.2–1.2)
Total Protein: 7.9 g/dL (ref 6.0–8.3)

## 2024-02-12 LAB — TSH: TSH: 0.89 u[IU]/mL (ref 0.35–5.50)

## 2024-02-12 LAB — LIPID PANEL
Cholesterol: 175 mg/dL (ref 0–200)
HDL: 58.1 mg/dL (ref >39.00)
LDL Cholesterol: 92 mg/dL (ref 0–99)
NonHDL: 117.14
Total CHOL/HDL Ratio: 3
Triglycerides: 128 mg/dL (ref 0.0–149.0)
VLDL: 25.6 mg/dL (ref 0.0–40.0)

## 2024-02-12 LAB — FERRITIN: Ferritin: 18.4 ng/mL (ref 10.0–291.0)

## 2024-02-12 MED ORDER — VALSARTAN 40 MG PO TABS
40.0000 mg | ORAL_TABLET | Freq: Every day | ORAL | 3 refills | Status: DC
  Filled 2024-02-12: qty 30, 30d supply, fill #0

## 2024-02-12 NOTE — Assessment & Plan Note (Signed)
Chronic Controlled Management per Dr Lucia Gaskins

## 2024-02-12 NOTE — Assessment & Plan Note (Addendum)
 Chronic Blood pressure not ideally controlled here and has been elevated several times at home CMP, lipid, CBC Continue propranolol  80 mg twice daily Start valsartan 40 mg daily She will continue to monitor BP at home and update me if it is not ideally controlled-consistently less than 130/80

## 2024-02-12 NOTE — Assessment & Plan Note (Signed)
Chronic ?Mild, intermittent ?Albuterol inhaler as needed - rarely needed ? ?

## 2024-02-12 NOTE — Assessment & Plan Note (Signed)
 New  Has had 3 episodes of feeling like she hears her heartbeat in her ear and it extends up into the head No obvious cause BP at the time was well-controlled at home She does know she was fairly tired when the last episode occurred Discussed possible dehydration, anemia, low/high BP Labs today She will continue to monitor her BP Monitor episodes and let me know if they continue to occur

## 2024-02-12 NOTE — Addendum Note (Signed)
 Addended by: Katherene Pals on: 02/12/2024 10:40 AM   Modules accepted: Orders

## 2024-02-12 NOTE — Assessment & Plan Note (Signed)
 Hx iron def anemia secondary to heavy menses No longer on birth control Menses regular and heavy Cbc, iron panel

## 2024-02-18 ENCOUNTER — Other Ambulatory Visit (HOSPITAL_COMMUNITY): Payer: Self-pay

## 2024-02-18 MED ORDER — EMGALITY 120 MG/ML ~~LOC~~ SOAJ
120.0000 mg | SUBCUTANEOUS | 0 refills | Status: DC
  Filled 2024-02-18: qty 1, 30d supply, fill #0

## 2024-02-22 ENCOUNTER — Encounter (INDEPENDENT_AMBULATORY_CARE_PROVIDER_SITE_OTHER): Payer: Self-pay | Admitting: Internal Medicine

## 2024-02-22 DIAGNOSIS — I10 Essential (primary) hypertension: Secondary | ICD-10-CM

## 2024-02-23 DIAGNOSIS — I10 Essential (primary) hypertension: Secondary | ICD-10-CM | POA: Diagnosis not present

## 2024-02-23 NOTE — Telephone Encounter (Signed)

## 2024-02-25 ENCOUNTER — Other Ambulatory Visit (HOSPITAL_COMMUNITY): Payer: Self-pay

## 2024-02-25 ENCOUNTER — Other Ambulatory Visit: Payer: Self-pay | Admitting: Internal Medicine

## 2024-02-25 MED ORDER — PROPRANOLOL HCL 80 MG PO TABS
80.0000 mg | ORAL_TABLET | Freq: Two times a day (BID) | ORAL | 3 refills | Status: AC
  Filled 2024-02-25: qty 60, 30d supply, fill #0
  Filled 2024-03-31: qty 60, 30d supply, fill #1
  Filled 2024-05-02: qty 60, 30d supply, fill #2
  Filled 2024-06-02: qty 60, 30d supply, fill #3
  Filled 2024-07-05: qty 60, 30d supply, fill #4
  Filled 2024-08-08: qty 60, 30d supply, fill #5
  Filled 2024-09-12: qty 60, 30d supply, fill #6
  Filled 2024-10-13: qty 60, 30d supply, fill #7
  Filled 2024-11-08: qty 60, 30d supply, fill #8

## 2024-03-03 ENCOUNTER — Telehealth: Payer: Self-pay | Admitting: Neurology

## 2024-03-03 NOTE — Telephone Encounter (Signed)
 Appointment made for chronic migraine f/u with wait list

## 2024-03-10 ENCOUNTER — Other Ambulatory Visit (HOSPITAL_COMMUNITY): Payer: Self-pay

## 2024-03-10 ENCOUNTER — Telehealth: Payer: Self-pay

## 2024-03-10 NOTE — Telephone Encounter (Signed)
 Pharmacy Patient Advocate Encounter   Received notification from CoverMyMeds that prior authorization for Emgality  120MG /ML syringes (migraine) is required/requested.   Insurance verification completed.   The patient is insured through [facility] .   Per test claim: PA required; PA submitted to above mentioned insurance via CoverMyMeds Key/confirmation #/EOC [facility] Status is pending

## 2024-03-15 ENCOUNTER — Other Ambulatory Visit (HOSPITAL_COMMUNITY): Payer: Self-pay

## 2024-03-15 ENCOUNTER — Other Ambulatory Visit: Payer: Self-pay | Admitting: Internal Medicine

## 2024-03-15 ENCOUNTER — Other Ambulatory Visit: Payer: Self-pay | Admitting: Adult Health

## 2024-03-15 ENCOUNTER — Other Ambulatory Visit: Payer: Self-pay

## 2024-03-15 ENCOUNTER — Other Ambulatory Visit: Payer: Self-pay | Admitting: Neurology

## 2024-03-15 DIAGNOSIS — G43709 Chronic migraine without aura, not intractable, without status migrainosus: Secondary | ICD-10-CM

## 2024-03-15 MED ORDER — VALACYCLOVIR HCL 500 MG PO TABS
500.0000 mg | ORAL_TABLET | Freq: Two times a day (BID) | ORAL | 2 refills | Status: AC | PRN
  Filled 2024-03-15: qty 30, 15d supply, fill #0
  Filled 2024-05-20: qty 30, 15d supply, fill #1
  Filled 2024-08-29: qty 30, 15d supply, fill #2

## 2024-03-17 ENCOUNTER — Other Ambulatory Visit (HOSPITAL_COMMUNITY): Payer: Self-pay

## 2024-03-17 MED ORDER — EMGALITY 120 MG/ML ~~LOC~~ SOAJ
120.0000 mg | SUBCUTANEOUS | 3 refills | Status: DC
  Filled 2024-03-17 – 2024-03-31 (×2): qty 1, 30d supply, fill #0
  Filled 2024-05-02: qty 1, 30d supply, fill #1
  Filled 2024-06-02: qty 1, 30d supply, fill #2
  Filled 2024-07-05: qty 1, 30d supply, fill #3

## 2024-03-17 MED ORDER — NURTEC 75 MG PO TBDP
75.0000 mg | ORAL_TABLET | Freq: Every day | ORAL | 3 refills | Status: AC | PRN
  Filled 2024-03-17 – 2024-03-31 (×2): qty 8, 30d supply, fill #0

## 2024-03-18 NOTE — Telephone Encounter (Signed)
 I submitted for syringes-it was approved but should have been for the auto injector-will try to resubmit.

## 2024-03-21 ENCOUNTER — Other Ambulatory Visit (HOSPITAL_COMMUNITY): Payer: Self-pay

## 2024-03-21 ENCOUNTER — Telehealth: Payer: Self-pay

## 2024-03-21 NOTE — Telephone Encounter (Signed)
 Pharmacy Patient Advocate Encounter  Received notification from [facility] that Prior Authorization for Emgality  120MG /ML auto-injectors (migraine) has been APPROVED from 03/21/2024 to 03/21/2025   PA #/Case ID/Reference #: [id number]

## 2024-03-21 NOTE — Telephone Encounter (Signed)
 Pharmacy Patient Advocate Encounter   Received notification from CoverMyMeds that prior authorization for Emgality  120MG /ML auto-injectors (migraine) is required/requested.   Insurance verification completed.   The patient is insured through [facility] .   Per test claim: PA required; PA submitted to above mentioned insurance via CoverMyMeds Key/confirmation #/EOC [id number] Status is pending

## 2024-03-29 ENCOUNTER — Other Ambulatory Visit (HOSPITAL_COMMUNITY): Payer: Self-pay

## 2024-03-31 ENCOUNTER — Other Ambulatory Visit (HOSPITAL_COMMUNITY): Payer: Self-pay

## 2024-04-01 ENCOUNTER — Other Ambulatory Visit: Payer: Self-pay

## 2024-06-15 ENCOUNTER — Other Ambulatory Visit (HOSPITAL_COMMUNITY): Payer: Self-pay

## 2024-07-05 ENCOUNTER — Telehealth: Payer: Self-pay

## 2024-07-05 NOTE — Telephone Encounter (Signed)
 Pharmacy Patient Advocate Encounter   Received notification from CoverMyMeds that prior authorization for Nurtec 75mg  Tablet is due for renewal.   Insurance verification completed.   The patient is insured through [facility].   Action: Patient hasn't been seen in your office in over a year. Plan requires updated chart notes for PA renewal.  I sent PT a [facility] message asking them to call [facility] to schedule visit.

## 2024-07-11 ENCOUNTER — Ambulatory Visit (INDEPENDENT_AMBULATORY_CARE_PROVIDER_SITE_OTHER)

## 2024-07-11 DIAGNOSIS — Z23 Encounter for immunization: Secondary | ICD-10-CM

## 2024-07-11 NOTE — Progress Notes (Signed)
 After obtaining consent, and per orders of Dr. Geofm, injection of RFLU given by Ronnald SHAUNNA Palms. Patient instructed to report any adverse reaction to me immediately.

## 2024-07-20 ENCOUNTER — Ambulatory Visit: Admitting: Neurology

## 2024-07-20 ENCOUNTER — Encounter: Payer: Self-pay | Admitting: Neurology

## 2024-07-20 VITALS — BP 108/84 | HR 77 | Resp 14 | Ht 65.0 in | Wt 147.0 lb

## 2024-07-20 DIAGNOSIS — G43709 Chronic migraine without aura, not intractable, without status migrainosus: Secondary | ICD-10-CM

## 2024-07-20 DIAGNOSIS — Z86018 Personal history of other benign neoplasm: Secondary | ICD-10-CM | POA: Diagnosis not present

## 2024-07-20 NOTE — Progress Notes (Signed)
 [facility]  PATIENT: Jasmine Flores DOB: 07-31-1986  REQUESTING CLINICIAN: Geofm Glade PARAS, MD HISTORY FROM: Patient  REASON FOR VISIT: Follow up for migraines    HISTORICAL  CHIEF COMPLAINT:  Chief Complaint  Patient presents with   Migraine    Rm12, alone, Migraine:5/30 days w/migraine. Triggers: menstration cycle, sleep deprivation, screen time, lights    HISTORY OF PRESENT ILLNESS:  Discussed the use of AI scribe software for clinical note transcription with the patient, who gave verbal consent to proceed.  Jasmine Flores is a 38 year old female with history of pituitary adenoma, migraines who presents with for follow up for her migraine headaches.  She experiences headaches approximately five to six days per month, which is an improvement from her previous frequency of at least sixteen days per month. The headaches are described as pressure-like, often located on the right side behind her eyes, but can occur anywhere. Pulsating sensations around her ears have resolved.  For acute headache management, she takes Tylenol  while at work and [employer]	[place] at home. Nurtec requires at least two doses to be effective and makes her feel like she needs to lie down, which is why she avoids taking it at work. She previously used Ubrelvy  but has since switched to Nurtec, she also had tried numerous Triptans. For prevention, she is on Emgality  and propranolol . She has tried various triptans in the past, which caused nausea, but has not tried any nasal sprays.  During headaches, she experiences nausea and sensitivity to light and noise. There is a family history of headaches, with her sister experiencing them, though not as severely. She has a history of a pituitary adenoma, which was removed in 2008, and she last had her last MRI brain checked in 2021. Her headaches worsened after the adenoma removal.  Her sleep is variable, with good nights allowing  for seven hours of rest, though this is rare. She consumes minimal caffeine , about one small cup per day, and describes her stress level as manageable despite having two young children.    Headache History and Characteristics: Location: Right sided behind eyes  Quality:  pressure, pulsating pain Duration: Few hours, has to lay day and fall asleep Migrainous Features: Photophobia, phonophobia, nausea, vomiting.  Aura: No  History of brain injury or tumor: No  Family history: sister Motion sickness: no Cardiac history: no  OTC: tylenol  Caffeine : minimal  Sleep: 7 hours  Mood/ Stress: Manageable     OTHER MEDICAL CONDITIONS: History of pituitary adenoma resected in 2008, migraine headaches,   REVIEW OF SYSTEMS: Full 14 system review of systems performed and negative with exception of: As noted in the HPI   ALLERGIES: Allergies  Allergen Reactions   Shellfish Allergy Itching, Swelling, Shortness Of Breath and Anaphylaxis   Sulfa Antibiotics Itching and Swelling    Patient doesn't ever recall when this happened.    HOME MEDICATIONS: Outpatient Medications Prior to Visit  Medication Sig Dispense Refill   albuterol  (VENTOLIN  HFA) 108 (90 Base) MCG/ACT inhaler Inhale 2 puffs into the lungs every 4 (four) hours as needed.     Docusate Sodium  (DSS) 100 MG CAPS Colace     ELDERBERRY PO Take by mouth.     Galcanezumab -gnlm (EMGALITY ) 120 MG/ML SOAJ Inject 120 mg into the skin every 30 (thirty) days. 1 mL 3   Multiple Vitamin (MULTIVITAMIN PO) Take by mouth.     naratriptan  (AMERGE) 2.5 MG tablet TAKE 1 TABLET BY MOUTH AT ONSET  OF HEADACHE/MIGRAINE. IF RETURNS OR DOES NOT RESOLVE, MAY REPEAT AFTER 2 HOURS. DO NOT EXCEED 5MG  IN 24 HOURS 9 tablet 5   ondansetron  (ZOFRAN -ODT) 4 MG disintegrating tablet Take 1 tablet (4 mg total) by mouth every 8 (eight) hours as needed (for nausea associated with migraines). 30 tablet 3   PRESCRIPTION MEDICATION Take 60 mg by mouth daily. QULIPTA      propranolol  (INDERAL ) 80 MG tablet Take 1 tablet (80 mg total) by mouth 2 (two) times daily. 180 tablet 3   Rimegepant Sulfate  (NURTEC) 75 MG TBDP Take 1 tablet (75 mg total) by mouth daily as needed for migraines. Take as close to onset of migraine as possible. (One daily maximum). 16 tablet 3   valACYclovir  (VALTREX ) 500 MG tablet Take 1 tablet (500 mg total) by mouth every 12 (twelve) hours as needed. For three days 30 tablet 2   Ubrogepant  (UBRELVY ) 100 MG TABS Take 1 tablet (100 mg) by mouth every 2 (two) hours as needed. Maximum 2 tablets (200mg ) a day. 16 tablet 11   valsartan  (DIOVAN ) 40 MG tablet Take 1 tablet (40 mg total) by mouth daily. 90 tablet 3   No facility-administered medications prior to visit.    PAST MEDICAL HISTORY: Past Medical History:  Diagnosis Date   Alpha trait thalassemia    Anemia    Asthma    Headache    HSV infection    Migraines    Positive TB test    Pregnancy induced hypertension    Vaginal Pap smear, abnormal     PAST SURGICAL HISTORY: Past Surgical History:  Procedure Laterality Date   PITUITARY SURGERY  2008   for adenoma   TONSILECTOMY, ADENOIDECTOMY, BILATERAL MYRINGOTOMY AND TUBES     TONSILLECTOMY     and adenoids   WISDOM TOOTH EXTRACTION      FAMILY HISTORY: Family History  Problem Relation Age of Onset   Diabetes Mother    Hypertension Mother    Diabetes Father    Hypertension Father    Stroke Father    Hypertension Maternal Grandmother    Diabetes Maternal Grandmother    Hypertension Maternal Grandfather    Diabetes Maternal Grandfather    Colon cancer Neg Hx    Esophageal cancer Neg Hx    Inflammatory bowel disease Neg Hx    Liver disease Neg Hx    Pancreatic cancer Neg Hx    Stomach cancer Neg Hx    Rectal cancer Neg Hx    Migraines Neg Hx     SOCIAL HISTORY: Social History   Socioeconomic History   Marital status: Single    Spouse name: Not on file   Number of children: 2   Years of education: Not on file    Highest education level: Master's degree (e.g., MA, MS, MEng, MEd, MSW, MBA)  Occupational History   Not on file  Tobacco Use   Smoking status: Never   Smokeless tobacco: Never  Vaping Use   Vaping status: Never Used  Substance and Sexual Activity   Alcohol use: No   Drug use: No   Sexual activity: Yes    Birth control/protection: None  Other Topics Concern   Not on file  Social History Narrative   Works for [employer] at home with her fiance and her children   Right handed   Caffeine : has one can of soda/day   Social Drivers of Corporate investment banker Strain: Low Risk  (06/23/2018)  Overall Financial Resource Strain (CARDIA)    Difficulty of Paying Living Expenses: Not hard at all  Food Insecurity: No Food Insecurity (06/23/2018)   Hunger Vital Sign    Worried About Running Out of Food in the Last Year: Never true    Ran Out of Food in the Last Year: Never true  Transportation Needs: Unknown (06/23/2018)   PRAPARE - Administrator, Civil Service (Medical): No    Lack of Transportation (Non-Medical): Not on file  Physical Activity: Inactive (06/23/2018)   Exercise Vital Sign    Days of Exercise per Week: 0 days    Minutes of Exercise per Session: 0 min  Stress: Stress Concern Present (06/23/2018)   [employer] of Occupational Health - Occupational Stress Questionnaire    Feeling of Stress : To some extent  Social Connections: Unknown (02/18/2022)   [facility]   Social Network    Social Network: Not on file  Intimate Partner Violence: Unknown (01/10/2022)   Received from [facility]   HITS    Physically Hurt: Not on file    Insult or Talk Down To: Not on file    Threaten Physical Harm: Not on file    Scream or Curse: Not on file    PHYSICAL EXAM  GENERAL EXAM/CONSTITUTIONAL: Vitals:  Vitals:   07/20/24 1548  BP: 108/84  Pulse: 77  Resp: 14  SpO2: 99%  Weight: 147 lb (66.7 kg)  Height: 5' 5 (1.651 m)    Body mass index is 24.46 kg/m. Wt Readings from Last 3 Encounters:  07/20/24 147 lb (66.7 kg)  02/12/24 147 lb (66.7 kg)  03/13/23 140 lb (63.5 kg)   Patient is in no distress; well developed, nourished and groomed; neck is supple  MUSCULOSKELETAL: Gait, strength, tone, movements noted in Neurologic exam below  NEUROLOGIC: MENTAL STATUS:      No data to display         awake, alert, oriented to person, place and time recent and remote memory intact normal attention and concentration language fluent, comprehension intact, naming intact fund of knowledge appropriate  CRANIAL NERVE:  2nd - no papilledema or hemorrhages on fundoscopic exam 2nd, 3rd, 4th, 6th - pupils equal and reactive to light, visual fields full to confrontation, extraocular muscles intact, no nystagmus 5th - facial sensation symmetric 7th - facial strength symmetric 8th - hearing intact 9th - palate elevates symmetrically, uvula midline 11th - shoulder shrug symmetric 12th - tongue protrusion midline  MOTOR:  normal bulk and tone, full strength in the BUE, BLE  SENSORY:  normal and symmetric to light touch, pinprick  COORDINATION:  finger-nose-finger, fine finger movements normal  GAIT/STATION:  normal   DIAGNOSTIC DATA (LABS, IMAGING, TESTING) - I reviewed patient records, labs, notes, testing and imaging myself where available.  Lab Results  Component Value Date   WBC 5.2 02/12/2024   HGB 12.5 02/12/2024   HCT 38.5 02/12/2024   MCV 78.3 02/12/2024   PLT 336.0 02/12/2024      Component Value Date/Time   NA 138 02/12/2024 0846   K 4.1 02/12/2024 0846   CL 101 02/12/2024 0846   CO2 28 02/12/2024 0846   GLUCOSE 98 02/12/2024 0846   BUN 18 02/12/2024 0846   CREATININE 0.70 02/12/2024 0846   CALCIUM 9.5 02/12/2024 0846   PROT 7.9 02/12/2024 0846   ALBUMIN 4.3 02/12/2024 0846   AST 19 02/12/2024 0846   ALT 16 02/12/2024 0846   ALKPHOS 86 02/12/2024  0846   BILITOT 0.7 02/12/2024  0846   GFRNONAA >60 06/07/2019 1530   GFRAA >60 06/07/2019 1530   Lab Results  Component Value Date   CHOL 175 02/12/2024   HDL 58.10 02/12/2024   LDLCALC 92 02/12/2024   TRIG 128.0 02/12/2024   CHOLHDL 3 02/12/2024   No results found for: HGBA1C No results found for: VITAMINB12 Lab Results  Component Value Date   TSH 0.89 02/12/2024    MRI Brain 03/12/2020 -Stable postoperative pituitary resection findings; no significant change compared to 2017 MRI. -Remainder of MRI brain is unremarkable.   ASSESSMENT AND PLAN  38 y.o. year old female with   Chronic migraine without aura Chronic migraines reduced to 5-6 days per month from 16+ days. Headaches are right-sided, behind the eyes, described as pressure. Nurtec requires two doses for full effect. Tylenol  used at work. Triptans previously caused nausea. No family history of severe headaches. Considering nasal spray for faster relief, bypassing GI tract with potential side effect of bad taste. - Provide Zapspret nasal spray samples for trial. - Discuss potential insurance approval for nasal spray if effective. - Educate on nasal spray usage and potential side effects, including bad taste. - Continue Emgality  and propranolol  for prevention.  Pituitary adenoma, status post resection Pituitary adenoma resected in 2008. Last MRI in 2021. Headaches worsened post-surgery but improved with medication.  - Order MRI for surveillance, comparing with 2021 results. - Follow up in 6-8 months to assess headache stability    1. Chronic migraine without aura without status migrainosus, not intractable   2. History of pituitary adenoma     Patient Instructions  Continue with Emgality  as preventive medication Continue with Nurtec as abortive medication If patient sample of Zavzpret Will obtain surveillance MRI regarding patient history of pituitary adenoma. Please contact me for updates Follow-up in 6 to 8 months or sooner if  worse   Orders Placed This Encounter  Procedures   MR BRAIN W WO CONTRAST    No orders of the defined types were placed in this encounter.   Return in about 6 months (around 01/18/2025).    Pastor Falling, MD 07/20/2024, 4:55 PM  [facility] [address], [address] [place], [place] [address] [phone]

## 2024-07-20 NOTE — Patient Instructions (Signed)
 Continue with Emgality  as preventive medication Continue with Nurtec as abortive medication If patient sample of Zavzpret Will obtain surveillance MRI regarding patient history of pituitary adenoma. Please contact me for updates Follow-up in 6 to 8 months or sooner if worse

## 2024-07-21 ENCOUNTER — Telehealth: Payer: Self-pay | Admitting: Neurology

## 2024-07-21 NOTE — Telephone Encounter (Signed)
 Spoke with pt and scheduled MRI brain w/wo contrast at [facility] for 08/17/24 at 8am

## 2024-07-21 NOTE — Telephone Encounter (Signed)
 Pt is asking to be called back for the scheduling of her MRI

## 2024-07-25 ENCOUNTER — Ambulatory Visit: Admitting: Neurology

## 2024-08-17 ENCOUNTER — Ambulatory Visit: Payer: Self-pay | Admitting: Neurology

## 2024-08-17 ENCOUNTER — Other Ambulatory Visit: Payer: Self-pay | Admitting: Neurology

## 2024-08-17 ENCOUNTER — Other Ambulatory Visit (HOSPITAL_COMMUNITY): Payer: Self-pay

## 2024-08-17 ENCOUNTER — Telehealth: Payer: Self-pay | Admitting: Neurology

## 2024-08-17 ENCOUNTER — Other Ambulatory Visit: Payer: Self-pay

## 2024-08-17 ENCOUNTER — Ambulatory Visit

## 2024-08-17 DIAGNOSIS — Z86018 Personal history of other benign neoplasm: Secondary | ICD-10-CM

## 2024-08-17 MED ORDER — ZAVZPRET 10 MG/ACT NA SOLN
10.0000 mg | NASAL | 11 refills | Status: DC | PRN
  Filled 2024-08-17: qty 6, 30d supply, fill #0

## 2024-08-17 MED ORDER — GADOBENATE DIMEGLUMINE 529 MG/ML IV SOLN
10.0000 mL | Freq: Once | INTRAVENOUS | Status: AC | PRN
  Administered 2024-08-17: 10 mL via INTRAVENOUS

## 2024-08-17 NOTE — Telephone Encounter (Signed)
 Pt stopped by check out and asked if she could have a prescription for Zapspret nasal spray. She said that the samples she was given helped her migraines. She said the prescription could be sent to [facility]

## 2024-08-17 NOTE — Telephone Encounter (Signed)
 Dr. Gregg- can you send in rx for zavzpret nasal spray? Pt states samples you provided effective. We can also forward to PA team to work on [place]

## 2024-08-17 NOTE — Telephone Encounter (Signed)
 Zavzpret sent to the [facility] [place]. Thanks

## 2024-08-18 ENCOUNTER — Telehealth: Payer: Self-pay

## 2024-08-18 ENCOUNTER — Other Ambulatory Visit (HOSPITAL_COMMUNITY): Payer: Self-pay

## 2024-08-18 NOTE — Telephone Encounter (Signed)
 Pharmacy Patient Advocate Encounter   Received notification from Physician's Office that prior authorization for Zavzpret is required/requested.   Insurance verification completed.   The patient is insured through [facility].   Per test claim: PA required; PA submitted to above mentioned insurance via Latent Key/confirmation #/EOC [id number] Status is pending

## 2024-08-19 ENCOUNTER — Other Ambulatory Visit (HOSPITAL_COMMUNITY): Payer: Self-pay

## 2024-08-19 ENCOUNTER — Ambulatory Visit: Admitting: Internal Medicine

## 2024-08-19 ENCOUNTER — Other Ambulatory Visit: Payer: Self-pay | Admitting: *Deleted

## 2024-08-19 DIAGNOSIS — G43709 Chronic migraine without aura, not intractable, without status migrainosus: Secondary | ICD-10-CM

## 2024-08-19 MED ORDER — EMGALITY 120 MG/ML ~~LOC~~ SOAJ
120.0000 mg | SUBCUTANEOUS | 11 refills | Status: AC
  Filled 2024-08-19: qty 1, 30d supply, fill #0
  Filled 2024-10-13: qty 1, 30d supply, fill #1
  Filled 2024-11-08: qty 1, 30d supply, fill #2

## 2024-08-19 NOTE — Telephone Encounter (Signed)
 Pharmacy Patient Advocate Encounter  Received notification from [employer] that Prior Authorization for Zavzpret has been DENIED.  Full denial letter will be uploaded to the media tab. See denial reason below.   PA #/Case ID/Reference #: [id number]

## 2024-08-22 ENCOUNTER — Telehealth: Payer: Self-pay

## 2024-08-22 ENCOUNTER — Other Ambulatory Visit: Payer: Self-pay | Admitting: Neurology

## 2024-08-22 ENCOUNTER — Other Ambulatory Visit (HOSPITAL_COMMUNITY): Payer: Self-pay

## 2024-08-22 MED ORDER — ZOLMITRIPTAN 5 MG PO TABS
5.0000 mg | ORAL_TABLET | ORAL | 3 refills | Status: AC | PRN
  Filled 2024-08-22: qty 10, 30d supply, fill #0

## 2024-08-22 NOTE — Telephone Encounter (Signed)
 Noted

## 2024-08-22 NOTE — Telephone Encounter (Signed)
Pt has returned call to RN, please call  

## 2024-08-22 NOTE — Telephone Encounter (Signed)
 Called pt at [phone]. LVM

## 2024-08-22 NOTE — Telephone Encounter (Addendum)
 Dr. Camara- Zavzpret denied. I see where they have tried/failed Imitrex but not Zomig.   Also have not tried: Ubrelvy   Did you want to change to one of these options?

## 2024-08-22 NOTE — Telephone Encounter (Signed)
 NO PA NEEDED

## 2024-08-22 NOTE — Telephone Encounter (Signed)
 Will prescribed Zomig. Patient is currently on Nurtec.

## 2024-08-22 NOTE — Telephone Encounter (Signed)
 Called pt back. She was able to get a one time fill of Zavzpret. She will try this first to see how it works. She is aware not to take Zomig with this. She will try Zomig after she uses up Zavzpret on hand. If Zomig ineffective, she will let us  know.

## 2024-08-23 ENCOUNTER — Other Ambulatory Visit (HOSPITAL_COMMUNITY): Payer: Self-pay

## 2024-08-23 NOTE — Telephone Encounter (Signed)
 Denial. See other phone message.

## 2024-09-05 ENCOUNTER — Other Ambulatory Visit (HOSPITAL_COMMUNITY): Payer: Self-pay

## 2024-09-13 ENCOUNTER — Other Ambulatory Visit (HOSPITAL_COMMUNITY): Payer: Self-pay

## 2024-10-13 ENCOUNTER — Other Ambulatory Visit (HOSPITAL_COMMUNITY): Payer: Self-pay

## 2025-04-26 ENCOUNTER — Ambulatory Visit: Admitting: Adult Health
